# Patient Record
Sex: Female | Born: 1985 | Hispanic: Yes | Marital: Single | Smoking: Never smoker
Health system: Southern US, Community
[De-identification: ages and names within clinical notes are randomized; demographics above are authoritative.]

## PROBLEM LIST (undated history)

## (undated) DIAGNOSIS — E039 Hypothyroidism, unspecified: Secondary | ICD-10-CM

## (undated) DIAGNOSIS — R739 Hyperglycemia, unspecified: Principal | ICD-10-CM

## (undated) DIAGNOSIS — IMO0002 Reserved for concepts with insufficient information to code with codable children: Secondary | ICD-10-CM

## (undated) HISTORY — DX: Hypothyroidism, unspecified: E03.9

## (undated) HISTORY — DX: Reserved for concepts with insufficient information to code with codable children: IMO0002

## (undated) HISTORY — PX: AUGMENTATION MAMMAPLASTY: SUR837

## (undated) HISTORY — DX: Hyperglycemia, unspecified: R73.9

## (undated) HISTORY — PX: REMOVAL OF BILATERAL TISSUE EXPANDERS WITH PLACEMENT OF BILATERAL BREAST IMPLANTS: SHX6431

---

## 2011-07-25 DIAGNOSIS — IMO0002 Reserved for concepts with insufficient information to code with codable children: Secondary | ICD-10-CM

## 2011-07-25 HISTORY — DX: Reserved for concepts with insufficient information to code with codable children: IMO0002

## 2011-08-12 ENCOUNTER — Inpatient Hospital Stay (HOSPITAL_COMMUNITY)
Admission: EM | Admit: 2011-08-12 | Discharge: 2011-08-17 | DRG: 340 | Disposition: A | Discharge status: Home / Self Care | Payer: Medicaid Other | Attending: General Surgery | Admitting: General Surgery

## 2011-08-12 ENCOUNTER — Emergency Department (HOSPITAL_COMMUNITY): Payer: Medicaid Other

## 2011-08-12 ENCOUNTER — Other Ambulatory Visit (INDEPENDENT_AMBULATORY_CARE_PROVIDER_SITE_OTHER): Payer: Self-pay | Admitting: General Surgery

## 2011-08-12 DIAGNOSIS — K35209 Acute appendicitis with generalized peritonitis, without abscess, unspecified as to perforation: Principal | ICD-10-CM | POA: Diagnosis present

## 2011-08-12 DIAGNOSIS — R197 Diarrhea, unspecified: Secondary | ICD-10-CM

## 2011-08-12 DIAGNOSIS — R112 Nausea with vomiting, unspecified: Secondary | ICD-10-CM

## 2011-08-12 DIAGNOSIS — K352 Acute appendicitis with generalized peritonitis, without abscess: Principal | ICD-10-CM | POA: Diagnosis present

## 2011-08-12 DIAGNOSIS — R1031 Right lower quadrant pain: Secondary | ICD-10-CM

## 2011-08-12 DIAGNOSIS — D72829 Elevated white blood cell count, unspecified: Secondary | ICD-10-CM | POA: Diagnosis present

## 2011-08-12 DIAGNOSIS — R509 Fever, unspecified: Secondary | ICD-10-CM | POA: Diagnosis present

## 2011-08-12 HISTORY — PX: APPENDECTOMY: SHX54

## 2011-08-12 LAB — POCT I-STAT, CHEM 8
BUN: 12 mg/dL (ref 6–23)
Calcium, Ion: 1.14 mmol/L (ref 1.12–1.32)
Chloride: 97 mEq/L (ref 96–112)
Creatinine, Ser: 0.8 mg/dL (ref 0.50–1.10)
Glucose, Bld: 123 mg/dL — ABNORMAL HIGH (ref 70–99)
HCT: 41 % (ref 36.0–46.0)
Hemoglobin: 13.9 g/dL (ref 12.0–15.0)
Potassium: 2.8 mEq/L — ABNORMAL LOW (ref 3.5–5.1)
Sodium: 133 mEq/L — ABNORMAL LOW (ref 135–145)
TCO2: 23 mmol/L (ref 0–100)

## 2011-08-12 LAB — COMPREHENSIVE METABOLIC PANEL
ALT: 44 U/L — ABNORMAL HIGH (ref 0–35)
CO2: 24 mEq/L (ref 19–32)
Calcium: 9.5 mg/dL (ref 8.4–10.5)
Chloride: 94 mEq/L — ABNORMAL LOW (ref 96–112)
Creatinine, Ser: 0.72 mg/dL (ref 0.50–1.10)
GFR calc Af Amer: >90 mL/min (ref >90)
GFR calc non Af Amer: >90 mL/min (ref >90)
Glucose, Bld: 120 mg/dL — ABNORMAL HIGH (ref 70–99)
Sodium: 129 mEq/L — ABNORMAL LOW (ref 135–145)
Total Bilirubin: 1.7 mg/dL — ABNORMAL HIGH (ref 0.3–1.2)

## 2011-08-12 LAB — DIFFERENTIAL
Basophils Absolute: 0 10*3/uL (ref 0.0–0.1)
Lymphocytes Relative: 10 % — ABNORMAL LOW (ref 12–46)
Monocytes Relative: 6 % (ref 3–12)
Neutro Abs: 13.8 10*3/uL — ABNORMAL HIGH (ref 1.7–7.7)
Neutrophils Relative %: 84 % — ABNORMAL HIGH (ref 43–77)

## 2011-08-12 LAB — URINALYSIS, ROUTINE W REFLEX MICROSCOPIC
Nitrite: NEGATIVE
Protein, ur: 100 mg/dL — AB
Specific Gravity, Urine: 1.025 (ref 1.005–1.030)
Urobilinogen, UA: 0.2 mg/dL (ref 0.0–1.0)

## 2011-08-12 LAB — CBC
Platelets: 212 10*3/uL (ref 150–400)
RDW: 15.6 % — ABNORMAL HIGH (ref 11.5–15.5)
WBC: 16.4 10*3/uL — ABNORMAL HIGH (ref 4.0–10.5)

## 2011-08-12 LAB — URINE MICROSCOPIC-ADD ON

## 2011-08-12 LAB — OCCULT BLOOD, POC DEVICE: Fecal Occult Bld: NEGATIVE

## 2011-08-12 LAB — POCT PREGNANCY, URINE: Preg Test, Ur: NEGATIVE

## 2011-08-12 MED ORDER — IOHEXOL 300 MG/ML  SOLN
100.0000 mL | Freq: Once | INTRAMUSCULAR | Status: AC | PRN
  Administered 2011-08-12: 80 mL via INTRAVENOUS

## 2011-08-13 DIAGNOSIS — K3580 Unspecified acute appendicitis: Secondary | ICD-10-CM

## 2011-08-13 LAB — BASIC METABOLIC PANEL
Chloride: 103 mEq/L (ref 96–112)
Creatinine, Ser: 0.55 mg/dL (ref 0.50–1.10)
GFR calc Af Amer: >90 mL/min (ref >90)
GFR calc non Af Amer: >90 mL/min (ref >90)

## 2011-08-14 LAB — BASIC METABOLIC PANEL
CO2: 25 mEq/L (ref 19–32)
Calcium: 8.5 mg/dL (ref 8.4–10.5)
Creatinine, Ser: 0.47 mg/dL — ABNORMAL LOW (ref 0.50–1.10)
GFR calc Af Amer: >90 mL/min (ref >90)
GFR calc non Af Amer: >90 mL/min (ref >90)
Sodium: 138 mEq/L (ref 135–145)

## 2011-08-15 LAB — CBC
MCV: 81.6 fL (ref 78.0–100.0)
Platelets: 195 10*3/uL (ref 150–400)
RBC: 3.86 MIL/uL — ABNORMAL LOW (ref 3.87–5.11)
RDW: 16 % — ABNORMAL HIGH (ref 11.5–15.5)
WBC: 10.6 10*3/uL — ABNORMAL HIGH (ref 4.0–10.5)

## 2011-08-16 LAB — CBC
MCH: 26.8 pg (ref 26.0–34.0)
MCHC: 33.4 g/dL (ref 30.0–36.0)
MCV: 80 fL (ref 78.0–100.0)
Platelets: 221 10*3/uL (ref 150–400)
RDW: 15.6 % — ABNORMAL HIGH (ref 11.5–15.5)
WBC: 11.3 10*3/uL — ABNORMAL HIGH (ref 4.0–10.5)

## 2011-08-16 NOTE — Op Note (Signed)
NAMEJAKYRIA, Melanie Rodriguez NO.:  [id number]  MEDICAL RECORD NO.:  [id number]  LOCATION:  1507                         FACILITY:  [facility]  PHYSICIAN:  Adolph Pollack, M.D.DATE OF BIRTH:  01/21/1986  DATE OF PROCEDURE:  08/13/2011 DATE OF DISCHARGE:                              OPERATIVE REPORT   PREOPERATIVE DIAGNOSIS:  Acute appendicitis with perforation.  POSTOPERATIVE DIAGNOSIS:  Acute appendicitis with perforation.  PROCEDURE:  Laparoscopic appendectomy.  SURGEON:  Adolph Pollack, M.D.  ANESTHESIA:  General.  INDICATIONS:  This is a 25 year old female, who has had 2-day history of progressively increasing right lower quadrant pain and diarrhea.  CT scan demonstrates findings consistent with acute appendicitis with perforation.  There is also some mild right hydroureter related to the inflammatory process.  She now presents for the above procedure.  TECHNIQUE:  She was brought to the operative room, placed supine on the operating table, and general anesthetic was administered.  Foley catheter was inserted and clear urine was evacuated.  The abdominal wall was sterilely prepped and draped.  Marcaine was infiltrated in the subumbilical region.  A small subumbilical incision was made through the skin, subcutaneous tissue, fascia, and peritoneum, entering the peritoneal cavity under direct vision.  A pursestring suture of 0 Vicryl was placed around the fascial edges.  A Hasson trocar was introduced in the peritoneal cavity and Pneumoperitoneum was created by insufflation of CO2 gas.  Laparoscope was introduced and cloudy fluid was noted in the pelvis.  I placed a 5 mm trocar in the left lower quadrant.  I evacuated some cloudy fluid and noted the appendix to be acutely inflamed and indurated going down into the pelvis, and I was able to mobilize this free from the pelvic sidewall.  A 5 mm trocar was then placed in the right upper quadrant.  The  mesoappendix was grasped and retracted anteriorly.  I divided the mesoappendix down to the base of the cecum.  Perforation was actually in the proximal third of the appendix and there were 2 fecaliths that had escaped during the perforation and I removed these. I subsequently amputated the appendix off the cecum with a small bit of cecum using the Endo-GIA stapler and placed the appendix in the Endo pouch bag.  It was removed through the subumbilical port.  I then copiously irrigated out the pelvic area and the right lower quadrant area with 3 liters of saline solution.  The solution returned and clear.  I inspected the staple line.  There was minimal bleeding, but I did apply hemoclips to control this.  I evacuated the irrigation fluid as much as possible.  I subsequently removed the subumbilical trocar under laparoscopic vision and closed the fascial defect by tightening up and tying down the pursestring suture.  Remaining trocars removed and then pneumoperitoneum was released.  Skin incisions were closed with 4-0 Monocryl subcuticular stitches. Steri-Strips and sterile dressings were applied.  She tolerated the procedure without any apparent complications and was taken to the recovery room in satisfactory condition.     Adolph Pollack, M.D.     [name]  D:  08/13/2011  T:  08/13/2011  Job:  [id number]  cc:   Lacretia Leigh. Quintella Reichert, M.D. Fax: [phone]  Electronically Signed by Avel Peace M.D. on 08/16/2011 08:32:22 AM

## 2011-08-16 NOTE — H&P (Signed)
  NAMEYSABELLE, Melanie Rodriguez NO.:  [id number]  MEDICAL RECORD NO.:  [id number]  LOCATION:  WLED                         FACILITY:  [facility]  PHYSICIAN:  Adolph Pollack, M.D.DATE OF BIRTH:  08-10-1986  DATE OF ADMISSION:  08/12/2011 DATE OF DISCHARGE:                             HISTORY & PHYSICAL   REASON FOR ADMISSION:  Acute appendicitis with perforation.  HISTORY:  This is a 25 year old female, who 2 days prior to admission, developed right lower quadrant pain and diarrhea.  She presented to her primary care physician.  We gave her some IV fluids and IV Rocephin. She is noted have a leukocytosis at that time.  She came back to the primary care physician with continued symptoms with increasing right lower quadrant pain, and was sent to the emergency department.  She was evaluated and noted to have an acute appendicitis with signs of perforation on CT scan, and I was asked to see her.  She has had some nausea, vomiting, and fever.  PAST MEDICAL HISTORY:  No chronic illnesses.  PREVIOUS OPERATIONS:  None.  ALLERGIES:  None.  MEDICATIONS:  Prevacid and Phenergan.  SOCIAL HISTORY:  She is single.  She has a 62-month-old child.  She is in the hospital with her boyfriend.  Denies tobacco or alcohol use.  FAMILY HISTORY:  Noncontributory.  REVIEW OF SYSTEMS:  CARDIOVASCULAR:  No chest pain.  PULMONARY:  No shortness of breath.  GI:  As per HPI.  GU:  No dysuria or hematuria.  PHYSICAL EXAMINATION:  GENERAL:  Ill-appearing female, alert, pleasant, and cooperative. VITAL SIGNS:  Temperature is 100, pulse 113, blood pressure is 114/64, respiratory rate is 20. HEENT:  Normocephalic, atraumatic.  No icterus. NECK:  Supple without mass. RESPIRATORY:  Breath sounds equal and clear.  Respirations unlabored. CARDIOVASCULAR:  Increased rate, regular rhythm. ABDOMEN:  Soft with right lower quadrant tenderness and guarding.  There is no mass present. GU:  No  hernia. MUSCULOSKELETAL:  No edema.  Good range of motion. SKIN:  Increased warmth.  LABORATORY DATA:  Sodium 129, potassium 2.8.  Glucose was 120.  BUN 12, creatinine 0.72.  Total bilirubin is 1.7.  Other liver function tests were normal.  White blood cell count 16,400 and hemoglobin 12.6.  CT scan reviewed and demonstrates a dilated inflamed appendix with multiple appendicoliths present.  It appears to be a possible abscess as well.  IMPRESSION:  Acute appendicitis with likely perforation and possible abscess.  She has been given some intravenous Invanz.  PLAN:  Laparoscopic possible open appendectomy.  I discussed the procedure and the risks with her in detail.  Risks include, but not limited to bleeding, infection, wound healing problems, anesthesia, accidental damage to intraabdominal organs, such as kidney, ureter, bladder intestine.  We also talked about aftercare and potential prolonged hospital stay for infection control.  She seems to understand all this and agrees with the plan.     Adolph Pollack, M.D.     [name]  D:  08/13/2011  T:  08/13/2011  Job:  [id number]  Electronically Signed by Avel Peace M.D. on 08/16/2011 08:32:01 AM

## 2011-08-17 LAB — CBC
MCHC: 32.9 g/dL (ref 30.0–36.0)
RDW: 15.7 % — ABNORMAL HIGH (ref 11.5–15.5)

## 2011-08-20 NOTE — Discharge Summary (Signed)
  NAMEANUPAMA, PIEHL NO.:  [id number]  MEDICAL RECORD NO.:  [id number]  LOCATION:  1507                         FACILITY:  [facility]  PHYSICIAN:  Juanetta Gosling, MDDATE OF BIRTH:  [birth year]  DATE OF ADMISSION:  08/12/2011 DATE OF DISCHARGE:  08/17/2011                              DISCHARGE SUMMARY    ADMITTING DIAGNOSIS:  Acute appendicitis with perforation.  DISCHARGE DIAGNOSIS:  Acute appendicitis with perforation.  PROCEDURE:  Laparoscopic appendectomy.  ADDITIONAL DIAGNOSIS:  None.  BRIEF HISTORY:  The patient is a 25 year old female who presented to the ER and was seen on August 12, 2011, by Dr. Avel Peace.  She had a right lower quadrant pain with diarrhea.  She was given IV fluids and Rocephin.  She had leukocytosis, and CT scan was consistent with acute appendicitis with signs of perforation.  He recommended laparoscopic cholecystectomy at that time, and the patient agreed.  HOSPITAL COURSE:  The patient was admitted and taken to the OR on August 13, 2011.  She underwent laparoscopic appendectomy and tolerated the procedure well.  She has done well postoperatively.  But has continued to run fevers and somewhat elevated white count.  She had been maintained on IV Invanz and has had no problems.  Her temperature spikes are improving and her white count is still elevated.  On August 17, 2011, she is feeling fine.  She is ambulating normally without any discomfort.  Her incisions looked good.  Her abdomen is soft and nontender, and she is quite anxious to go home.  We recommended she stay for at least another 24 hours of IV antibiotics, but she would really like to go.  Dr. Dwain Sarna stated an alternate would be another full 7 days of Augmentin and the patient is agreeable to that.  We have subsequently planned to discharge her home on oxycodone/APAP 5/325, 1-2 p.o. q.4 p.r.n.  Augmentin 875/125, 1 tab q.12.  She can take plain Tylenol  p.r.n. for pain.  I am going to discontinue her Prilosec and Phenergan.  She will follow up with Dr. Abbey Chatters in 2 weeks.  She has a discharge instruction sheet with instructions to call especially if she has any increased pain, fever, trouble voiding, drainage, or abdominal pain.  DISCHARGE ACTIVITY:  Light to moderate.  No lifting over 15 pounds for 2 weeks.  CONDITION ON DISCHARGE:  Improved.     Eber Hong, P.A.   ______________________________ Juanetta Gosling, MD    WDJ/MEDQ  D:  08/17/2011  T:  08/17/2011  Job:  [id number]  cc:   Lacretia Leigh. Quintella Reichert, M.D. Fax: [phone]  Electronically Signed by Sherrie George P.A. on 08/18/2011 09:05:28 PM Electronically Signed by Emelia Loron MD on 08/20/2011 11:36:13 AM

## 2011-08-30 ENCOUNTER — Encounter (INDEPENDENT_AMBULATORY_CARE_PROVIDER_SITE_OTHER): Payer: Self-pay

## 2011-09-06 ENCOUNTER — Ambulatory Visit
Admission: RE | Admit: 2011-09-06 | Discharge: 2011-09-06 | Disposition: A | Discharge status: Home / Self Care | Payer: Medicaid Other | Source: Ambulatory Visit | Attending: General Surgery | Admitting: General Surgery

## 2011-09-06 ENCOUNTER — Encounter (INDEPENDENT_AMBULATORY_CARE_PROVIDER_SITE_OTHER): Payer: Self-pay | Admitting: General Surgery

## 2011-09-06 ENCOUNTER — Ambulatory Visit (INDEPENDENT_AMBULATORY_CARE_PROVIDER_SITE_OTHER): Payer: Self-pay | Admitting: General Surgery

## 2011-09-06 VITALS — BP 106/64 | HR 76 | Temp 99.2°F | Resp 14 | Ht 63.0 in | Wt 139.4 lb

## 2011-09-06 DIAGNOSIS — Z9889 Other specified postprocedural states: Secondary | ICD-10-CM

## 2011-09-06 DIAGNOSIS — G8918 Other acute postprocedural pain: Secondary | ICD-10-CM

## 2011-09-06 DIAGNOSIS — K3580 Unspecified acute appendicitis: Secondary | ICD-10-CM

## 2011-09-06 MED ORDER — IOHEXOL 300 MG/ML  SOLN
100.0000 mL | Freq: Once | INTRAMUSCULAR | Status: AC | PRN
  Administered 2011-09-06: 100 mL via INTRAVENOUS

## 2011-09-06 NOTE — Progress Notes (Signed)
Melanie Rodriguez 18-Nov-1985 [id number] 09/06/2011   History of Present Illness: Melanie Rodriguez is a  25 y.o. female who presents today status post lap appy dr. Lenetta Quaker.  Pathology reveals acute suppurative appendicitis.  The patient is tolerating a regular diet, having normal bowel movements.  She says about 1.5 weeks ago she felt she was running fevers.  Never checked.  C/o pus-like urine and maybe some blood.  Unable to tell if this is from her menstrual cycle trying to start back after having her baby.  Her pain is constant, in the RLQ and radiating towards her back.  She does some pain in her abdomen with voiding, no dysuria.  Physical Exam: Abd: soft, tender in RLQ, hypoactive bowel sounds, nondistended.  No peritoneal signs or guading.  All incisions are well healed.  Impression: 1.  Acute appendicitis, s/p lap appy  Plan: She will be sent for CBC, BMET, UA, and a CT scan of abd/pelvis to r/o post-op abscess vs UTI.  I'm concerned with her description of her urine, but her other symptoms are more c/w post-op abscess.  We will follow up with her tomorrow after her labs and CT are complete for further instructions.

## 2011-09-06 NOTE — Patient Instructions (Signed)
We will call you with further instructions after your labs and imaging are complete.

## 2011-09-07 LAB — CBC WITH DIFFERENTIAL/PLATELET
Eosinophils Absolute: 0.1 10*3/uL (ref 0.0–0.7)
Lymphocytes Relative: 23 % (ref 12–46)
Lymphs Abs: 1.8 10*3/uL (ref 0.7–4.0)
MCH: 27 pg (ref 26.0–34.0)
Neutrophils Relative %: 66 % (ref 43–77)
Platelets: 328 10*3/uL (ref 150–400)
RBC: 3.82 MIL/uL — ABNORMAL LOW (ref 3.87–5.11)
WBC: 7.7 10*3/uL (ref 4.0–10.5)

## 2011-09-07 LAB — BASIC METABOLIC PANEL
CO2: 26 mEq/L (ref 19–32)
Calcium: 9.1 mg/dL (ref 8.4–10.5)
Sodium: 142 mEq/L (ref 135–145)

## 2011-09-08 ENCOUNTER — Other Ambulatory Visit (INDEPENDENT_AMBULATORY_CARE_PROVIDER_SITE_OTHER): Payer: Self-pay

## 2011-09-08 DIAGNOSIS — T8140XA Infection following a procedure, unspecified, initial encounter: Secondary | ICD-10-CM

## 2011-09-08 LAB — URINALYSIS
Ketones, ur: NEGATIVE mg/dL
Leukocytes, UA: NEGATIVE
Nitrite: NEGATIVE
Specific Gravity, Urine: 1.017 (ref 1.005–1.030)
pH: 6 (ref 5.0–8.0)

## 2011-09-08 MED ORDER — CIPROFLOXACIN HCL 500 MG PO TABS: 500.0000 mg | ORAL_TABLET | Freq: Two times a day (BID) | ORAL | Status: AC

## 2011-09-20 ENCOUNTER — Emergency Department (HOSPITAL_COMMUNITY)
Admission: EM | Admit: 2011-09-20 | Discharge: 2011-09-20 | Discharge status: Against Medical Advice | Payer: Self-pay | Attending: Emergency Medicine | Admitting: Emergency Medicine

## 2011-09-20 ENCOUNTER — Encounter (HOSPITAL_COMMUNITY): Payer: Self-pay | Admitting: Emergency Medicine

## 2011-09-20 DIAGNOSIS — R109 Unspecified abdominal pain: Secondary | ICD-10-CM | POA: Insufficient documentation

## 2011-09-20 NOTE — ED Notes (Signed)
Pt had appendectomy on 10/20, later had infection. Pt states finished cipro yesterday and is still having bad pain on R side.

## 2011-09-20 NOTE — ED Notes (Signed)
Pt has decided to leave, AMA papers signed

## 2011-09-21 ENCOUNTER — Emergency Department (HOSPITAL_COMMUNITY): Payer: Self-pay

## 2011-09-21 ENCOUNTER — Emergency Department (HOSPITAL_COMMUNITY)
Admission: EM | Admit: 2011-09-21 | Discharge: 2011-09-22 | Disposition: A | Discharge status: Home / Self Care | Payer: Self-pay | Attending: Emergency Medicine | Admitting: Emergency Medicine

## 2011-09-21 ENCOUNTER — Encounter (HOSPITAL_COMMUNITY): Payer: Self-pay | Admitting: *Deleted

## 2011-09-21 DIAGNOSIS — R1031 Right lower quadrant pain: Secondary | ICD-10-CM | POA: Insufficient documentation

## 2011-09-21 DIAGNOSIS — R112 Nausea with vomiting, unspecified: Secondary | ICD-10-CM | POA: Insufficient documentation

## 2011-09-21 LAB — COMPREHENSIVE METABOLIC PANEL
ALT: 20 U/L (ref 0–35)
AST: 17 U/L (ref 0–37)
Albumin: 3.7 g/dL (ref 3.5–5.2)
CO2: 24 mEq/L (ref 19–32)
Calcium: 9.6 mg/dL (ref 8.4–10.5)
GFR calc non Af Amer: >90 mL/min (ref >90)
Sodium: 136 mEq/L (ref 135–145)

## 2011-09-21 LAB — DIFFERENTIAL
Basophils Absolute: 0 10*3/uL (ref 0.0–0.1)
Eosinophils Relative: 0 % (ref 0–5)
Lymphocytes Relative: 12 % (ref 12–46)
Neutro Abs: 12.1 10*3/uL — ABNORMAL HIGH (ref 1.7–7.7)

## 2011-09-21 LAB — WET PREP, GENITAL
Clue Cells Wet Prep HPF POC: NONE SEEN
Trich, Wet Prep: NONE SEEN

## 2011-09-21 LAB — CBC
Platelets: 350 10*3/uL (ref 150–400)
RDW: 15.3 % (ref 11.5–15.5)
WBC: 14.9 10*3/uL — ABNORMAL HIGH (ref 4.0–10.5)

## 2011-09-21 LAB — URINE MICROSCOPIC-ADD ON

## 2011-09-21 LAB — POCT PREGNANCY, URINE: Preg Test, Ur: NEGATIVE

## 2011-09-21 LAB — URINALYSIS, ROUTINE W REFLEX MICROSCOPIC
Nitrite: NEGATIVE
Specific Gravity, Urine: 1.016 (ref 1.005–1.030)
Urobilinogen, UA: 0.2 mg/dL (ref 0.0–1.0)

## 2011-09-21 MED ORDER — AMOXICILLIN-POT CLAVULANATE 875-125 MG PO TABS: 1.0000 | ORAL_TABLET | Freq: Two times a day (BID) | ORAL | Status: DC

## 2011-09-21 MED ORDER — HYDROMORPHONE HCL PF 1 MG/ML IJ SOLN: 1.0000 mg | Freq: Once | INTRAMUSCULAR | Status: DC

## 2011-09-21 MED ORDER — PIPERACILLIN-TAZOBACTAM 3.375 G IVPB
3.3750 g | Freq: Once | INTRAVENOUS | Status: AC
  Administered 2011-09-21: 3.375 g via INTRAVENOUS
  Filled 2011-09-21: qty 50

## 2011-09-21 MED ORDER — SODIUM CHLORIDE 0.9 % IV BOLUS (SEPSIS)
1000.0000 mL | Freq: Once | INTRAVENOUS | Status: AC
  Administered 2011-09-21: 1000 mL via INTRAVENOUS

## 2011-09-21 MED ORDER — MORPHINE SULFATE 4 MG/ML IJ SOLN: 4.0000 mg | Freq: Once | INTRAMUSCULAR | Status: DC

## 2011-09-21 MED ORDER — OXYCODONE-ACETAMINOPHEN 5-325 MG PO TABS
2.0000 | ORAL_TABLET | Freq: Once | ORAL | Status: AC
  Administered 2011-09-21: 2 via ORAL
  Filled 2011-09-21: qty 2

## 2011-09-21 MED ORDER — OXYCODONE-ACETAMINOPHEN 5-325 MG PO TABS: 2.0000 | ORAL_TABLET | Freq: Four times a day (QID) | ORAL | Status: DC | PRN

## 2011-09-21 MED ORDER — HYDROMORPHONE HCL PF 2 MG/ML IJ SOLN
INTRAMUSCULAR | Status: AC
  Administered 2011-09-21: 1 mg via INTRAVENOUS
  Filled 2011-09-21: qty 1

## 2011-09-21 MED ORDER — IOHEXOL 300 MG/ML  SOLN
80.0000 mL | Freq: Once | INTRAMUSCULAR | Status: AC | PRN
  Administered 2011-09-21: 80 mL via INTRAVENOUS

## 2011-09-21 MED ORDER — ONDANSETRON HCL 4 MG/2ML IJ SOLN
4.0000 mg | Freq: Once | INTRAMUSCULAR | Status: AC
  Administered 2011-09-21: 4 mg via INTRAVENOUS
  Filled 2011-09-21: qty 2

## 2011-09-21 MED ORDER — HYDROMORPHONE HCL PF 1 MG/ML IJ SOLN
1.0000 mg | Freq: Once | INTRAMUSCULAR | Status: AC
  Administered 2011-09-21: 1 mg via INTRAVENOUS
  Filled 2011-09-21: qty 1

## 2011-09-21 NOTE — ED Notes (Signed)
Pt drinking PO contrast

## 2011-09-21 NOTE — ED Provider Notes (Cosign Needed Addendum)
History     CSN: [id number] Arrival date & time: 09/21/2011 10:50 AM   First MD Initiated Contact with Patient 09/21/11 1158      Chief Complaint  Patient presents with  . Abdominal Pain   pleasant 25 year old female with history of recent appendicitis in October. She also was seen in earlier this month for abdominal pain and had a CAT scan. She reports worsening right lower quadrant pain since yesterday, much worse this morning. Patient had been taking Cipro for possible kidney infection. She denies any vaginal bleeding or discharge. She's had no fevers. She does report some intermittent diarrhea and some intermittent vomiting, denies any hematemesis. She denies any other previous surgeries. No recent sick contacts or travel, but she is aware of  (Consider location/radiation/quality/duration/timing/severity/associated sxs/prior treatment) HPI  History reviewed. No pertinent past medical history.  Past Surgical History  Procedure Date  . Appendectomy     No family history on file.  History  Substance Use Topics  . Smoking status: Never Smoker   . Smokeless tobacco: Not on file  . Alcohol Use: No    OB History    Grav Para Term Preterm Abortions TAB SAB Ect Mult Living                  Review of Systems  All other systems reviewed and are negative.    Allergies  Review of patient's allergies indicates no known allergies.  Home Medications   Current Outpatient Rx  Name Route Sig Dispense Refill  . IBUPROFEN 200 MG PO TABS Oral Take 200 mg by mouth every 6 (six) hours as needed.        BP 116/61  Pulse 74  Temp(Src) 99.9 F (37.7 C) (Oral)  Resp 20  Wt 137 lb (62.143 kg)  SpO2 100%  LMP 09/05/2010  Physical Exam  Constitutional: She is oriented to person, place, and time. She appears well-developed and well-nourished.  HENT:  Head: Normocephalic and atraumatic.  Eyes: Conjunctivae and EOM are normal. Pupils are equal, round, and reactive to light.    Neck: Neck supple.  Cardiovascular: Normal rate and regular rhythm.  Exam reveals no gallop and no friction rub.   No murmur heard. Pulmonary/Chest: Breath sounds normal. She has no wheezes. She has no rales. She exhibits no tenderness.  Abdominal: Soft. Bowel sounds are normal. She exhibits no distension. There is tenderness. There is no rebound and no guarding.       Diffuse right lower quadrant tenderness, no rebound, rigidity or guarding.  Musculoskeletal: Normal range of motion.  Neurological: She is alert and oriented to person, place, and time. No cranial nerve deficit. Coordination normal.  Skin: Skin is warm and dry. No rash noted.  Psychiatric: She has a normal mood and affect.    ED Course  Procedures (including critical care time)  Labs Reviewed  URINALYSIS, ROUTINE W REFLEX MICROSCOPIC - Abnormal; Notable for the following:    Leukocytes, UA SMALL (*)    All other components within normal limits  URINE MICROSCOPIC-ADD ON - Abnormal; Notable for the following:    Squamous Epithelial / LPF FEW (*)    Bacteria, UA FEW (*)    All other components within normal limits  POCT PREGNANCY, URINE  POCT PREGNANCY, URINE   No results found.   No diagnosis found.    MDM  Pt is seen and examined;  Initial history and physical completed.  Will follow.   Results for orders placed during the hospital  encounter of 09/21/11  URINALYSIS, ROUTINE W REFLEX MICROSCOPIC      Component Value Range   Color, Urine YELLOW  YELLOW    Appearance CLEAR  CLEAR    Specific Gravity, Urine 1.016  1.005 - 1.030    pH 6.0  5.0 - 8.0    Glucose, UA NEGATIVE  NEGATIVE (mg/dL)   Hgb urine dipstick NEGATIVE  NEGATIVE    Bilirubin Urine NEGATIVE  NEGATIVE    Ketones, ur NEGATIVE  NEGATIVE (mg/dL)   Protein, ur NEGATIVE  NEGATIVE (mg/dL)   Urobilinogen, UA 0.2  0.0 - 1.0 (mg/dL)   Nitrite NEGATIVE  NEGATIVE    Leukocytes, UA SMALL (*) NEGATIVE   POCT PREGNANCY, URINE      Component Value Range    Preg Test, Ur NEGATIVE    URINE MICROSCOPIC-ADD ON      Component Value Range   Squamous Epithelial / LPF FEW (*) RARE    WBC, UA 3-6  <3 (WBC/hpf)   Bacteria, UA FEW (*) RARE    Urine-Other MUCOUS PRESENT     Ct Abdomen Pelvis W Contrast  09/06/2011  *RADIOLOGY REPORT*  Clinical Data: 25 year old female with perforated appendicitis - status post laparoscopic appendectomy 3 weeks ago.  Continued right abdominal and pelvic pain.  CT ABDOMEN AND PELVIS WITH CONTRAST  Technique:  Multidetector CT imaging of the abdomen and pelvis was performed following the standard protocol during bolus administration of intravenous contrast.  Contrast: OMNIPAQUE IOHEXOL 300 MG/ML IV SOLN  Comparison: 08/12/2011  Findings: Faint areas of decreased cortical attenuation within both kidneys are noted and pyelonephritis is not excluded. There is no evidence of hydronephrosis, urinary calculi or renal abscess/mass.  The liver, spleen, pancreas, adrenal glands and gallbladder are unremarkable.  Mild inflammatory changes within the right lower abdomen and pelvis are noted and likely postsurgical or residual changes from perforated appendicitis. There is no evidence of postoperative abscess, pneumoperitoneum or bowel obstruction. The bladder is within normal limits. A 2.2 cm right ovarian cyst / follicle is present. A trace amount of free fluid within the pelvis may be postsurgical or physiologic.  No acute or suspicious bony abnormalities are identified.  IMPRESSION: Very subtle areas of decreased cortical attenuation within both kidneys - pyelonephritis is not excluded.  Mild inflammatory changes in the right lower abdomen and pelvis - likely postsurgical and residual changes from prior perforated appendicitis.  No evidence of abscess.  Original Report Authenticated By: Rosendo Gros, M.D.            Posey Jasmin A. Patrica Duel, MD 09/21/11 1213   Results for orders placed during the hospital encounter of 09/21/11   URINALYSIS, ROUTINE W REFLEX MICROSCOPIC      Component Value Range   Color, Urine YELLOW  YELLOW    Appearance CLEAR  CLEAR    Specific Gravity, Urine 1.016  1.005 - 1.030    pH 6.0  5.0 - 8.0    Glucose, UA NEGATIVE  NEGATIVE (mg/dL)   Hgb urine dipstick NEGATIVE  NEGATIVE    Bilirubin Urine NEGATIVE  NEGATIVE    Ketones, ur NEGATIVE  NEGATIVE (mg/dL)   Protein, ur NEGATIVE  NEGATIVE (mg/dL)   Urobilinogen, UA 0.2  0.0 - 1.0 (mg/dL)   Nitrite NEGATIVE  NEGATIVE    Leukocytes, UA SMALL (*) NEGATIVE   POCT PREGNANCY, URINE      Component Value Range   Preg Test, Ur NEGATIVE    URINE MICROSCOPIC-ADD ON      Component  Value Range   Squamous Epithelial / LPF FEW (*) RARE    WBC, UA 3-6  <3 (WBC/hpf)   Bacteria, UA FEW (*) RARE    Urine-Other MUCOUS PRESENT     Ct Abdomen Pelvis W Contrast  09/06/2011  *RADIOLOGY REPORT*  Clinical Data: 25 year old female with perforated appendicitis - status post laparoscopic appendectomy 3 weeks ago.  Continued right abdominal and pelvic pain.  CT ABDOMEN AND PELVIS WITH CONTRAST  Technique:  Multidetector CT imaging of the abdomen and pelvis was performed following the standard protocol during bolus administration of intravenous contrast.  Contrast: OMNIPAQUE IOHEXOL 300 MG/ML IV SOLN  Comparison: 08/12/2011  Findings: Faint areas of decreased cortical attenuation within both kidneys are noted and pyelonephritis is not excluded. There is no evidence of hydronephrosis, urinary calculi or renal abscess/mass.  The liver, spleen, pancreas, adrenal glands and gallbladder are unremarkable.  Mild inflammatory changes within the right lower abdomen and pelvis are noted and likely postsurgical or residual changes from perforated appendicitis. There is no evidence of postoperative abscess, pneumoperitoneum or bowel obstruction. The bladder is within normal limits. A 2.2 cm right ovarian cyst / follicle is present. A trace amount of free fluid within the pelvis  may be postsurgical or physiologic.  No acute or suspicious bony abnormalities are identified.  IMPRESSION: Very subtle areas of decreased cortical attenuation within both kidneys - pyelonephritis is not excluded.  Mild inflammatory changes in the right lower abdomen and pelvis - likely postsurgical and residual changes from prior perforated appendicitis.  No evidence of abscess.  Original Report Authenticated By: Rosendo Gros, M.D.    Results for orders placed during the hospital encounter of 09/21/11  URINALYSIS, ROUTINE W REFLEX MICROSCOPIC      Component Value Range   Color, Urine YELLOW  YELLOW    APPearance CLEAR  CLEAR    Specific Gravity, Urine 1.016  1.005 - 1.030    pH 6.0  5.0 - 8.0    Glucose, UA NEGATIVE  NEGATIVE (mg/dL)   Hgb urine dipstick NEGATIVE  NEGATIVE    Bilirubin Urine NEGATIVE  NEGATIVE    Ketones, ur NEGATIVE  NEGATIVE (mg/dL)   Protein, ur NEGATIVE  NEGATIVE (mg/dL)   Urobilinogen, UA 0.2  0.0 - 1.0 (mg/dL)   Nitrite NEGATIVE  NEGATIVE    Leukocytes, UA SMALL (*) NEGATIVE   POCT PREGNANCY, URINE      Component Value Range   Preg Test, Ur NEGATIVE    URINE MICROSCOPIC-ADD ON      Component Value Range   Squamous Epithelial / LPF FEW (*) RARE    WBC, UA 3-6  <3 (WBC/hpf)   Bacteria, UA FEW (*) RARE    Urine-Other MUCOUS PRESENT    CBC      Component Value Range   WBC 14.9 (*) 4.0 - 10.5 (K/uL)   RBC 4.29  3.87 - 5.11 (MIL/uL)   Hemoglobin 11.6 (*) 12.0 - 15.0 (g/dL)   HCT 04.5 (*) 40.9 - 46.0 (%)   MCV 82.1  78.0 - 100.0 (fL)   MCH 27.0  26.0 - 34.0 (pg)   MCHC 33.0  30.0 - 36.0 (g/dL)   RDW 81.1  91.4 - 78.2 (%)   Platelets 350  150 - 400 (K/uL)  DIFFERENTIAL      Component Value Range   Neutrophils Relative 81 (*) 43 - 77 (%)   Neutro Abs 12.1 (*) 1.7 - 7.7 (K/uL)   Lymphocytes Relative 12  12 - 46 (%)  Lymphs Abs 1.8  0.7 - 4.0 (K/uL)   Monocytes Relative 6  3 - 12 (%)   Monocytes Absolute 0.9  0.1 - 1.0 (K/uL)   Eosinophils Relative 0  0 -  5 (%)   Eosinophils Absolute 0.1  0.0 - 0.7 (K/uL)   Basophils Relative 0  0 - 1 (%)   Basophils Absolute 0.0  0.0 - 0.1 (K/uL)  COMPREHENSIVE METABOLIC PANEL      Component Value Range   Sodium 136  135 - 145 (mEq/L)   Potassium 3.7  3.5 - 5.1 (mEq/L)   Chloride 101  96 - 112 (mEq/L)   CO2 24  19 - 32 (mEq/L)   Glucose, Bld 82  70 - 99 (mg/dL)   BUN 6  6 - 23 (mg/dL)   Creatinine, Ser 1.61 (*) 0.50 - 1.10 (mg/dL)   Calcium 9.6  8.4 - 09.6 (mg/dL)   Total Protein 8.6 (*) 6.0 - 8.3 (g/dL)   Albumin 3.7  3.5 - 5.2 (g/dL)   AST 17  0 - 37 (U/L)   ALT 20  0 - 35 (U/L)   Alkaline Phosphatase 104  39 - 117 (U/L)   Total Bilirubin 0.6  0.3 - 1.2 (mg/dL)   GFR calc non Af Amer >90  >90 (mL/min)   GFR calc Af Amer >90  >90 (mL/min)  LIPASE, BLOOD      Component Value Range   Lipase 24  11 - 59 (U/L)   Ct Abdomen Pelvis W Contrast  09/06/2011  *RADIOLOGY REPORT*  Clinical Data: 25 year old female with perforated appendicitis - status post laparoscopic appendectomy 3 weeks ago.  Continued right abdominal and pelvic pain.  CT ABDOMEN AND PELVIS WITH CONTRAST  Technique:  Multidetector CT imaging of the abdomen and pelvis was performed following the standard protocol during bolus administration of intravenous contrast.  Contrast: OMNIPAQUE IOHEXOL 300 MG/ML IV SOLN  Comparison: 08/12/2011  Findings: Faint areas of decreased cortical attenuation within both kidneys are noted and pyelonephritis is not excluded. There is no evidence of hydronephrosis, urinary calculi or renal abscess/mass.  The liver, spleen, pancreas, adrenal glands and gallbladder are unremarkable.  Mild inflammatory changes within the right lower abdomen and pelvis are noted and likely postsurgical or residual changes from perforated appendicitis. There is no evidence of postoperative abscess, pneumoperitoneum or bowel obstruction. The bladder is within normal limits. A 2.2 cm right ovarian cyst / follicle is present. A trace  amount of free fluid within the pelvis may be postsurgical or physiologic.  No acute or suspicious bony abnormalities are identified.  IMPRESSION: Very subtle areas of decreased cortical attenuation within both kidneys - pyelonephritis is not excluded.  Mild inflammatory changes in the right lower abdomen and pelvis - likely postsurgical and residual changes from prior perforated appendicitis.  No evidence of abscess.  Original Report Authenticated By: Rosendo Gros, M.D.    3:38 PM Abdominal pain, white count 14,000, noted. Will be signed out to the oncoming physician pending CT scan, reassessment, and appropriate disposition, stable condition  Jannice Beitzel A. Patrica Duel, MD 09/21/11 1539

## 2011-09-21 NOTE — ED Notes (Signed)
Pt speaks limited English, interpreter is friend, states "she started having belly pain yesterday morning all on the right side, vomited x 2"; pt denies diarrhea.

## 2011-09-21 NOTE — ED Notes (Signed)
Patient given discharge instructions, information, prescriptions, and diet order. Patient states that they adequately understand discharge information given and to return to ED if symptoms return or worsen.    Pt advised to finish all of her antibiotics. 2 Rx's given.

## 2011-09-21 NOTE — ED Notes (Signed)
MD at bedside. 

## 2011-09-21 NOTE — ED Notes (Signed)
Pt ambulatory to the BR with steady gait

## 2011-09-21 NOTE — Consult Note (Signed)
Chief Complaint:  Abdominal pain; 4 weeks out from perforated appendicitis.  History of Present Illness:  Melanie Rodriguez is an 25 y.o. female who presented  to the ED and a complete workup by Dr. Alto Denver including the CT scan listed below, a pelvic ultrasound, and a pelvic exam showed an inflammatory mass in the lower abdomen.  To me this looks too high to be tuboovarian and it appears as a phlegmon although it is probably an evolving abscess.  Presently she has some RLQ abdominal pain.    History reviewed. No pertinent past medical history.  Past Surgical History  Procedure Date  . Appendectomy     Medications Prior to Admission  Medication Dose Route Frequency Provider Last Rate Last Dose  . HYDROmorphone (DILAUDID) 2 MG/ML injection        1 mg at 09/21/11 1425  . HYDROmorphone (DILAUDID) injection 1 mg  1 mg Intravenous Once Cyndra Numbers, MD   1 mg at 09/21/11 2030  . iohexol (OMNIPAQUE) 300 MG/ML injection 80 mL  80 mL Intravenous Once PRN Medication Radiologist   80 mL at 09/21/11 1616  . ondansetron (ZOFRAN) injection 4 mg  4 mg Intravenous Once Peter A. Tucich, MD   4 mg at 09/21/11 1425  . piperacillin-tazobactam (ZOSYN) IVPB 3.375 g  3.375 g Intravenous Once Cyndra Numbers, MD   3.375 g at 09/21/11 2131  . sodium chloride 0.9 % bolus 1,000 mL  1,000 mL Intravenous Once Peter A. Tucich, MD   1,000 mL at 09/21/11 1429  . DISCONTD: HYDROmorphone (DILAUDID) injection 1 mg  1 mg Intravenous Once Peter A. Patrica Duel, MD      . DISCONTD: morphine 4 MG/ML injection 4 mg  4 mg Intravenous Once Cyndra Numbers, MD       Medications Prior to Admission  Medication Sig Dispense Refill  . ibuprofen (ADVIL,MOTRIN) 200 MG tablet Take 200 mg by mouth every 6 (six) hours as needed.         No Known Allergies No family history on file. Social History:   reports that she has never smoked. She does not have any smokeless tobacco history on file. She reports that she does not drink alcohol or use illicit  drugs.   REVIEW OF SYSTEMS - PERTINENT POSITIVES ONLY: Not contributory  Physical Exam:   Blood pressure 123/73, pulse 100, temperature 100.3 F (37.9 C), temperature source Oral, resp. rate 20, weight 137 lb (62.143 kg), last menstrual period 09/05/2010, SpO2 97.00%. There is no height on file to calculate BMI.  Gen:  No acute distress.   Neurological: Alert and oriented to person, place, and time. Speaks some English and understands.  Head: Normocephalic and atraumatic.  Eyes: Conjunctivae are normal. Pupils are equal, round, and reactive to light. No scleral icterus.  Neck: Normal range of motion. Neck supple. No tracheal deviation or thyromegaly present.  Cardiovascular:  SR without murmurs or gallops Respiratory: Effort normal.  No respiratory distress. No chest wall tenderness. Breath sounds normal.  No wheezes, rales or rhonchi.  GI: Flat abdomen.  Incisions healed from laparoscopy.  Tender to palpation to the right and below umbilicus GU:  Musculoskeletal: Normal range of motion. Extremities are nontender.  Lymphadenopathy: No cervical, preauricular, postauricular or axillary adenopathy is present Skin: Skin is warm and dry. No rash noted. No diaphoresis. No erythema. No pallor. No clubbing, cyanosis, or edema.  Pscyh: Normal mood and affect. Behavior is normal. Judgment and thought content normal.   LABORATORY RESULTS: Results for orders  placed during the hospital encounter of 09/21/11 (from the past 48 hour(s))  POCT PREGNANCY, URINE     Status: Normal   Collection Time   09/21/11 11:33 AM      Component Value Range Comment   Preg Test, Ur NEGATIVE     URINALYSIS, ROUTINE W REFLEX MICROSCOPIC     Status: Abnormal   Collection Time   09/21/11 12:30 PM      Component Value Range Comment   Color, Urine YELLOW  YELLOW     APPearance CLEAR  CLEAR     Specific Gravity, Urine 1.016  1.005 - 1.030     pH 6.0  5.0 - 8.0     Glucose, UA NEGATIVE  NEGATIVE (mg/dL)    Hgb urine  dipstick NEGATIVE  NEGATIVE     Bilirubin Urine NEGATIVE  NEGATIVE     Ketones, ur NEGATIVE  NEGATIVE (mg/dL)    Protein, ur NEGATIVE  NEGATIVE (mg/dL)    Urobilinogen, UA 0.2  0.0 - 1.0 (mg/dL)    Nitrite NEGATIVE  NEGATIVE     Leukocytes, UA SMALL (*) NEGATIVE    URINE MICROSCOPIC-ADD ON     Status: Abnormal   Collection Time   09/21/11 12:30 PM      Component Value Range Comment   Squamous Epithelial / LPF FEW (*) RARE     WBC, UA 3-6  <3 (WBC/hpf)    Bacteria, UA FEW (*) RARE     Urine-Other MUCOUS PRESENT     CBC     Status: Abnormal   Collection Time   09/21/11  2:10 PM      Component Value Range Comment   WBC 14.9 (*) 4.0 - 10.5 (K/uL)    RBC 4.29  3.87 - 5.11 (MIL/uL)    Hemoglobin 11.6 (*) 12.0 - 15.0 (g/dL)    HCT 16.1 (*) 09.6 - 46.0 (%)    MCV 82.1  78.0 - 100.0 (fL)    MCH 27.0  26.0 - 34.0 (pg)    MCHC 33.0  30.0 - 36.0 (g/dL)    RDW 04.5  40.9 - 81.1 (%)    Platelets 350  150 - 400 (K/uL)   DIFFERENTIAL     Status: Abnormal   Collection Time   09/21/11  2:10 PM      Component Value Range Comment   Neutrophils Relative 81 (*) 43 - 77 (%)    Neutro Abs 12.1 (*) 1.7 - 7.7 (K/uL)    Lymphocytes Relative 12  12 - 46 (%)    Lymphs Abs 1.8  0.7 - 4.0 (K/uL)    Monocytes Relative 6  3 - 12 (%)    Monocytes Absolute 0.9  0.1 - 1.0 (K/uL)    Eosinophils Relative 0  0 - 5 (%)    Eosinophils Absolute 0.1  0.0 - 0.7 (K/uL)    Basophils Relative 0  0 - 1 (%)    Basophils Absolute 0.0  0.0 - 0.1 (K/uL)   COMPREHENSIVE METABOLIC PANEL     Status: Abnormal   Collection Time   09/21/11  2:10 PM      Component Value Range Comment   Sodium 136  135 - 145 (mEq/L)    Potassium 3.7  3.5 - 5.1 (mEq/L)    Chloride 101  96 - 112 (mEq/L)    CO2 24  19 - 32 (mEq/L)    Glucose, Bld 82  70 - 99 (mg/dL)    BUN 6  6 - 23 (  mg/dL)    Creatinine, Ser 4.54 (*) 0.50 - 1.10 (mg/dL)    Calcium 9.6  8.4 - 10.5 (mg/dL)    Total Protein 8.6 (*) 6.0 - 8.3 (g/dL)    Albumin 3.7  3.5 - 5.2  (g/dL)    AST 17  0 - 37 (U/L)    ALT 20  0 - 35 (U/L)    Alkaline Phosphatase 104  39 - 117 (U/L)    Total Bilirubin 0.6  0.3 - 1.2 (mg/dL)    GFR calc non Af Amer >90  >90 (mL/min)    GFR calc Af Amer >90  >90 (mL/min)   LIPASE, BLOOD     Status: Normal   Collection Time   09/21/11  2:10 PM      Component Value Range Comment   Lipase 24  11 - 59 (U/L)   WET PREP, GENITAL     Status: Abnormal   Collection Time   09/21/11  5:36 PM      Component Value Range Comment   Yeast, Wet Prep NONE SEEN  NONE SEEN     Trich, Wet Prep NONE SEEN  NONE SEEN     Clue Cells, Wet Prep NONE SEEN  NONE SEEN     WBC, Wet Prep HPF POC MODERATE (*) NONE SEEN      RADIOLOGY RESULTS: US Transvaginal Non-ob  09/21/2011  *RADIOLOGY REPORT*  Clinical Data: Right lower quadrant pelvic pain  TRANSABDOMINAL AND TRANSVAGINAL ULTRASOUND OF PELVIS Technique:  Both transabdominal and transvaginal ultrasound examinations of the pelvis were performed. Transabdominal technique was performed for global imaging of the pelvis including uterus, ovaries, adnexal regions, and pelvic cul-de-sac.  Comparison: CT same date   It was necessary to proceed with endovaginal exam following the transabdominal exam to visualize the right adnexa and endometrium.  Findings:  Uterus: Retroverted, retroflexed.  7.5 x 5.4 x 5.0 cm.  No focal abnormality.  Endometrium: 9 mm.  Uniformly thin and echogenic.  Right ovary:  6.4 x 6.2 x 5.0 cm.  Cyst with reticular lace-like echoes measures 3.3 x 3.0 x 2.4 cm.  There may be a collapsing cyst adjacent to this within the right ovary, which could have accounted for the previous appearance on separate examination.  No hydrosalpinx is visualized.  Left ovary: 2.9 x 1.8 x 1.4 cm.  Normal.  Other findings: No free fluid  IMPRESSION: Probable right ovarian hemorrhagic cyst.  This accounts for the finding on the prior examination and no hydrosalpinx is identified. This may account for the patient's pain.  Short-interval follow up ultrasound in 6-12 weeks is recommended, preferably during the week following the patient's normal menses.  These results were called by telephone on 09/21/2011  at  7:45 p.m. to  Dr. Alto Denver, who verbally acknowledged these results.  Original Report Authenticated By: Harrel Lemon, M.D.   US Pelvis Complete  09/21/2011  *RADIOLOGY REPORT*  Clinical Data: Right lower quadrant pelvic pain  TRANSABDOMINAL AND TRANSVAGINAL ULTRASOUND OF PELVIS Technique:  Both transabdominal and transvaginal ultrasound examinations of the pelvis were performed. Transabdominal technique was performed for global imaging of the pelvis including uterus, ovaries, adnexal regions, and pelvic cul-de-sac.  Comparison: CT same date   It was necessary to proceed with endovaginal exam following the transabdominal exam to visualize the right adnexa and endometrium.  Findings:  Uterus: Retroverted, retroflexed.  7.5 x 5.4 x 5.0 cm.  No focal abnormality.  Endometrium: 9 mm.  Uniformly thin and echogenic.  Right ovary:  6.4 x 6.2 x 5.0 cm.  Cyst with reticular lace-like echoes measures 3.3 x 3.0 x 2.4 cm.  There may be a collapsing cyst adjacent to this within the right ovary, which could have accounted for the previous appearance on separate examination.  No hydrosalpinx is visualized.  Left ovary: 2.9 x 1.8 x 1.4 cm.  Normal.  Other findings: No free fluid  IMPRESSION: Probable right ovarian hemorrhagic cyst.  This accounts for the finding on the prior examination and no hydrosalpinx is identified. This may account for the patient's pain. Short-interval follow up ultrasound in 6-12 weeks is recommended, preferably during the week following the patient's normal menses.  These results were called by telephone on 09/21/2011  at  7:45 p.m. to  Dr. Alto Denver, who verbally acknowledged these results.  Original Report Authenticated By: Harrel Lemon, M.D.   Ct Abdomen Pelvis W Contrast  09/21/2011  *RADIOLOGY REPORT*   Clinical Data: Right upper quadrant and right lower quadrant abdominal pain.  CT ABDOMEN AND PELVIS WITH CONTRAST  Technique:  Multidetector CT imaging of the abdomen and pelvis was performed following the standard protocol during bolus administration of intravenous contrast.  Contrast: 80mL OMNIPAQUE IOHEXOL 300 MG/ML IV SOLN  Comparison: 09/06/2011  Findings: Lung bases are clear.  Abdominal viscera are normal.  No lymphadenopathy or ascites.  No free air.  Evidence of prior appendectomy noted.  Right ovarian cyst and adjacent tubular right adnexal structure noted.  Together these measure 7.1 x 4.4 x 4.1 cm.  Left ovary is normal.  Bowel is normal.  Trace pelvic free fluid noted.  Minimal peri cecal stranding is again noted.  No acute osseous finding.  IMPRESSION: Increasing right ovarian/adnexal cystic/tubular structure.  This may represent tubal ovarian abscess or abscess complicating prior appendicitis. Hemorrhagic cyst or bland hydrosalpinx is considered less likely.  Original Report Authenticated By: Harrel Lemon, M.D.    Problem List: Active Problems:  * No active hospital problems. *    Assessment & Plan: Evolving abscess; patient feels like she can go home.  Would give Augmentin 875 BID and have her seen at [facility] on Friday for evaluation or repeat CT scan to assess drainability of this process.      Matt B. Daphine Deutscher, MD, [facility] [phone] beeper [phone]  09/21/2011 10:16 PM

## 2011-09-21 NOTE — ED Notes (Signed)
Pt reports RUQ pain with n/v since yesterday.  Pt reports vomiting x 1 yesterday and x 1 today.  Denies any diarrhea.  Pt reports pain radiates to her R shoulder and R leg.

## 2011-09-21 NOTE — ED Notes (Signed)
Newly recollected urine resent to down to lab

## 2011-09-21 NOTE — Consult Note (Signed)
Chief Complaint:  Abdominal pain; 4 weeks out from perforated appendicitis.  History of Present Illness:  Melanie Rodriguez is an 25 y.o. female who presented  to the ED and a complete workup by Dr. Alto Denver including the CT scan listed below, a pelvic ultrasound, and a pelvic exam showed an inflammatory mass in the lower abdomen.  To me this looks too high to be tuboovarian and it appears as a phlegmon although it is probably an evolving abscess.  Presently she has some RLQ abdominal pain.    History reviewed. No pertinent past medical history.  Past Surgical History  Procedure Date  . Appendectomy     Medications Prior to Admission  Medication Dose Route Frequency Provider Last Rate Last Dose  . HYDROmorphone (DILAUDID) 2 MG/ML injection        1 mg at 09/21/11 1425  . HYDROmorphone (DILAUDID) injection 1 mg  1 mg Intravenous Once Cyndra Numbers, MD   1 mg at 09/21/11 2030  . iohexol (OMNIPAQUE) 300 MG/ML injection 80 mL  80 mL Intravenous Once PRN Medication Radiologist   80 mL at 09/21/11 1616  . ondansetron (ZOFRAN) injection 4 mg  4 mg Intravenous Once Peter A. Tucich, MD   4 mg at 09/21/11 1425  . piperacillin-tazobactam (ZOSYN) IVPB 3.375 g  3.375 g Intravenous Once Cyndra Numbers, MD   3.375 g at 09/21/11 2131  . sodium chloride 0.9 % bolus 1,000 mL  1,000 mL Intravenous Once Peter A. Tucich, MD   1,000 mL at 09/21/11 1429  . DISCONTD: HYDROmorphone (DILAUDID) injection 1 mg  1 mg Intravenous Once Peter A. Patrica Duel, MD      . DISCONTD: morphine 4 MG/ML injection 4 mg  4 mg Intravenous Once Cyndra Numbers, MD       Medications Prior to Admission  Medication Sig Dispense Refill  . ibuprofen (ADVIL,MOTRIN) 200 MG tablet Take 200 mg by mouth every 6 (six) hours as needed.         No Known Allergies No family history on file. Social History:   reports that she has never smoked. She does not have any smokeless tobacco history on file. She reports that she does not drink alcohol or use illicit  drugs.   REVIEW OF SYSTEMS - PERTINENT POSITIVES ONLY: Not contributory  Physical Exam:   Blood pressure 123/73, pulse 100, temperature 100.3 F (37.9 C), temperature source Oral, resp. rate 20, weight 137 lb (62.143 kg), last menstrual period 09/05/2010, SpO2 97.00%. There is no height on file to calculate BMI.  Gen:  No acute distress.   Neurological: Alert and oriented to person, place, and time. Speaks some English and understands.  Head: Normocephalic and atraumatic.  Eyes: Conjunctivae are normal. Pupils are equal, round, and reactive to light. No scleral icterus.  Neck: Normal range of motion. Neck supple. No tracheal deviation or thyromegaly present.  Cardiovascular:  SR without murmurs or gallops Respiratory: Effort normal.  No respiratory distress. No chest wall tenderness. Breath sounds normal.  No wheezes, rales or rhonchi.  GI: Flat abdomen.  Incisions healed from laparoscopy.  Tender to palpation to the right and below umbilicus GU:  Musculoskeletal: Normal range of motion. Extremities are nontender.  Lymphadenopathy: No cervical, preauricular, postauricular or axillary adenopathy is present Skin: Skin is warm and dry. No rash noted. No diaphoresis. No erythema. No pallor. No clubbing, cyanosis, or edema.  Pscyh: Normal mood and affect. Behavior is normal. Judgment and thought content normal.   LABORATORY RESULTS: Results for orders  placed during the hospital encounter of 09/21/11 (from the past 48 hour(s))  POCT PREGNANCY, URINE     Status: Normal   Collection Time   09/21/11 11:33 AM      Component Value Range Comment   Preg Test, Ur NEGATIVE     URINALYSIS, ROUTINE W REFLEX MICROSCOPIC     Status: Abnormal   Collection Time   09/21/11 12:30 PM      Component Value Range Comment   Color, Urine YELLOW  YELLOW     APPearance CLEAR  CLEAR     Specific Gravity, Urine 1.016  1.005 - 1.030     pH 6.0  5.0 - 8.0     Glucose, UA NEGATIVE  NEGATIVE (mg/dL)    Hgb urine  dipstick NEGATIVE  NEGATIVE     Bilirubin Urine NEGATIVE  NEGATIVE     Ketones, ur NEGATIVE  NEGATIVE (mg/dL)    Protein, ur NEGATIVE  NEGATIVE (mg/dL)    Urobilinogen, UA 0.2  0.0 - 1.0 (mg/dL)    Nitrite NEGATIVE  NEGATIVE     Leukocytes, UA SMALL (*) NEGATIVE    URINE MICROSCOPIC-ADD ON     Status: Abnormal   Collection Time   09/21/11 12:30 PM      Component Value Range Comment   Squamous Epithelial / LPF FEW (*) RARE     WBC, UA 3-6  <3 (WBC/hpf)    Bacteria, UA FEW (*) RARE     Urine-Other MUCOUS PRESENT     CBC     Status: Abnormal   Collection Time   09/21/11  2:10 PM      Component Value Range Comment   WBC 14.9 (*) 4.0 - 10.5 (K/uL)    RBC 4.29  3.87 - 5.11 (MIL/uL)    Hemoglobin 11.6 (*) 12.0 - 15.0 (g/dL)    HCT 16.1 (*) 09.6 - 46.0 (%)    MCV 82.1  78.0 - 100.0 (fL)    MCH 27.0  26.0 - 34.0 (pg)    MCHC 33.0  30.0 - 36.0 (g/dL)    RDW 04.5  40.9 - 81.1 (%)    Platelets 350  150 - 400 (K/uL)   DIFFERENTIAL     Status: Abnormal   Collection Time   09/21/11  2:10 PM      Component Value Range Comment   Neutrophils Relative 81 (*) 43 - 77 (%)    Neutro Abs 12.1 (*) 1.7 - 7.7 (K/uL)    Lymphocytes Relative 12  12 - 46 (%)    Lymphs Abs 1.8  0.7 - 4.0 (K/uL)    Monocytes Relative 6  3 - 12 (%)    Monocytes Absolute 0.9  0.1 - 1.0 (K/uL)    Eosinophils Relative 0  0 - 5 (%)    Eosinophils Absolute 0.1  0.0 - 0.7 (K/uL)    Basophils Relative 0  0 - 1 (%)    Basophils Absolute 0.0  0.0 - 0.1 (K/uL)   COMPREHENSIVE METABOLIC PANEL     Status: Abnormal   Collection Time   09/21/11  2:10 PM      Component Value Range Comment   Sodium 136  135 - 145 (mEq/L)    Potassium 3.7  3.5 - 5.1 (mEq/L)    Chloride 101  96 - 112 (mEq/L)    CO2 24  19 - 32 (mEq/L)    Glucose, Bld 82  70 - 99 (mg/dL)    BUN 6  6 - 23 (  mg/dL)    Creatinine, Ser 8.29 (*) 0.50 - 1.10 (mg/dL)    Calcium 9.6  8.4 - 10.5 (mg/dL)    Total Protein 8.6 (*) 6.0 - 8.3 (g/dL)    Albumin 3.7  3.5 - 5.2  (g/dL)    AST 17  0 - 37 (U/L)    ALT 20  0 - 35 (U/L)    Alkaline Phosphatase 104  39 - 117 (U/L)    Total Bilirubin 0.6  0.3 - 1.2 (mg/dL)    GFR calc non Af Amer >90  >90 (mL/min)    GFR calc Af Amer >90  >90 (mL/min)   LIPASE, BLOOD     Status: Normal   Collection Time   09/21/11  2:10 PM      Component Value Range Comment   Lipase 24  11 - 59 (U/L)   WET PREP, GENITAL     Status: Abnormal   Collection Time   09/21/11  5:36 PM      Component Value Range Comment   Yeast, Wet Prep NONE SEEN  NONE SEEN     Trich, Wet Prep NONE SEEN  NONE SEEN     Clue Cells, Wet Prep NONE SEEN  NONE SEEN     WBC, Wet Prep HPF POC MODERATE (*) NONE SEEN      RADIOLOGY RESULTS: US Transvaginal Non-ob  09/21/2011  *RADIOLOGY REPORT*  Clinical Data: Right lower quadrant pelvic pain  TRANSABDOMINAL AND TRANSVAGINAL ULTRASOUND OF PELVIS Technique:  Both transabdominal and transvaginal ultrasound examinations of the pelvis were performed. Transabdominal technique was performed for global imaging of the pelvis including uterus, ovaries, adnexal regions, and pelvic cul-de-sac.  Comparison: CT same date   It was necessary to proceed with endovaginal exam following the transabdominal exam to visualize the right adnexa and endometrium.  Findings:  Uterus: Retroverted, retroflexed.  7.5 x 5.4 x 5.0 cm.  No focal abnormality.  Endometrium: 9 mm.  Uniformly thin and echogenic.  Right ovary:  6.4 x 6.2 x 5.0 cm.  Cyst with reticular lace-like echoes measures 3.3 x 3.0 x 2.4 cm.  There may be a collapsing cyst adjacent to this within the right ovary, which could have accounted for the previous appearance on separate examination.  No hydrosalpinx is visualized.  Left ovary: 2.9 x 1.8 x 1.4 cm.  Normal.  Other findings: No free fluid  IMPRESSION: Probable right ovarian hemorrhagic cyst.  This accounts for the finding on the prior examination and no hydrosalpinx is identified. This may account for the patient's pain.  Short-interval follow up ultrasound in 6-12 weeks is recommended, preferably during the week following the patient's normal menses.  These results were called by telephone on 09/21/2011  at  7:45 p.m. to  Dr. Alto Denver, who verbally acknowledged these results.  Original Report Authenticated By: Harrel Lemon, M.D.   US Pelvis Complete  09/21/2011  *RADIOLOGY REPORT*  Clinical Data: Right lower quadrant pelvic pain  TRANSABDOMINAL AND TRANSVAGINAL ULTRASOUND OF PELVIS Technique:  Both transabdominal and transvaginal ultrasound examinations of the pelvis were performed. Transabdominal technique was performed for global imaging of the pelvis including uterus, ovaries, adnexal regions, and pelvic cul-de-sac.  Comparison: CT same date   It was necessary to proceed with endovaginal exam following the transabdominal exam to visualize the right adnexa and endometrium.  Findings:  Uterus: Retroverted, retroflexed.  7.5 x 5.4 x 5.0 cm.  No focal abnormality.  Endometrium: 9 mm.  Uniformly thin and echogenic.  Right ovary:  6.4 x 6.2 x 5.0 cm.  Cyst with reticular lace-like echoes measures 3.3 x 3.0 x 2.4 cm.  There may be a collapsing cyst adjacent to this within the right ovary, which could have accounted for the previous appearance on separate examination.  No hydrosalpinx is visualized.  Left ovary: 2.9 x 1.8 x 1.4 cm.  Normal.  Other findings: No free fluid  IMPRESSION: Probable right ovarian hemorrhagic cyst.  This accounts for the finding on the prior examination and no hydrosalpinx is identified. This may account for the patient's pain. Short-interval follow up ultrasound in 6-12 weeks is recommended, preferably during the week following the patient's normal menses.  These results were called by telephone on 09/21/2011  at  7:45 p.m. to  Dr. Alto Denver, who verbally acknowledged these results.  Original Report Authenticated By: Harrel Lemon, M.D.   Ct Abdomen Pelvis W Contrast  09/21/2011  *RADIOLOGY REPORT*   Clinical Data: Right upper quadrant and right lower quadrant abdominal pain.  CT ABDOMEN AND PELVIS WITH CONTRAST  Technique:  Multidetector CT imaging of the abdomen and pelvis was performed following the standard protocol during bolus administration of intravenous contrast.  Contrast: 80mL OMNIPAQUE IOHEXOL 300 MG/ML IV SOLN  Comparison: 09/06/2011  Findings: Lung bases are clear.  Abdominal viscera are normal.  No lymphadenopathy or ascites.  No free air.  Evidence of prior appendectomy noted.  Right ovarian cyst and adjacent tubular right adnexal structure noted.  Together these measure 7.1 x 4.4 x 4.1 cm.  Left ovary is normal.  Bowel is normal.  Trace pelvic free fluid noted.  Minimal peri cecal stranding is again noted.  No acute osseous finding.  IMPRESSION: Increasing right ovarian/adnexal cystic/tubular structure.  This may represent tubal ovarian abscess or abscess complicating prior appendicitis. Hemorrhagic cyst or bland hydrosalpinx is considered less likely.  Original Report Authenticated By: Harrel Lemon, M.D.    Problem List: Active Problems:  * No active hospital problems. *    Assessment & Plan: Evolving abscess; patient feels like she can go home.  Would give Augmentin 875 BID and have her seen at [facility] on Friday for evaluation or repeat CT scan to assess drainability of this process.      Matt B. Daphine Deutscher, MD, [facility] [phone] beeper [phone]  09/21/2011 10:07 PM

## 2011-09-22 ENCOUNTER — Telehealth (INDEPENDENT_AMBULATORY_CARE_PROVIDER_SITE_OTHER): Payer: Self-pay | Admitting: General Surgery

## 2011-09-22 DIAGNOSIS — R1031 Right lower quadrant pain: Secondary | ICD-10-CM

## 2011-09-22 LAB — GC/CHLAMYDIA PROBE AMP, GENITAL
Chlamydia, DNA Probe: NEGATIVE
GC Probe Amp, Genital: NEGATIVE

## 2011-09-22 NOTE — Telephone Encounter (Signed)
Patient called status post appendectomy on 08/12/11 by Dr Abbey Chatters. Patient went to the ER last night and was consulted by Dr Daphine Deutscher. Patient was having right lower quadrant pain and had a CT which showed possible abscess at appendectomy site and was advised to follow up with [place] in 24-48 hours. I asked Dr Ermalene Searing opinion who advised for patient to follow up with Dr Abbey Chatters if he has a clinic tomorrow and if not to get a CT scan of her abdomen and pelvis tomorrow am and for her to follow up with urg office doctor tomorrow pm. I made appt with Dr Dwain Sarna tomorrow and put in order for CT to have Stanton Kidney, our referral coordinator call.

## 2011-09-22 NOTE — ED Provider Notes (Signed)
Patient was evaluated by myself following signout. Patient had CT of the abdomen and pelvis that was concerning for a possible right tubo-ovarian lesion versus related appendiceal abscess. Pelvic exam was performed by myself. Patient had right adnexal tenderness. However her tenderness is more pronounced on external palpation on internal palpation. There is a slight amount of white discharge in the vaginal vault. There is no cervical motion tenderness. There is no left-sided adnexal tenderness. Samples were collected. Patient had a transvaginal ultrasound ordered to further characterize the patient's ovary and fallopian tubes. This identified a 3 cm hemorrhagic cyst. Patient also had a wet prep which was essentially unremarkable. Patient's exam had been inconsistent with infection transmitted disease. Patient was reassessed and continued to have guarding on my exam. Given that this was unlikely due to the hemorrhagic cystic consult surgery. Dr. Daphine Deutscher evaluated the patient. He recommended providing a dose of Zosyn here. He also recommended discharge with Augmentin. Patient is to be seen in clinic in 24-48 hours. At that time repeat scan likely will be performed and decision will be made regarding whether possible intervention is required. Patient was discharged home in good condition with prescription for Percocet as well as Augmentin. She was given strict precautions to return if she had worsening of her pain, nausea or vomiting, development of fevers, or other emergent concerns. Patient was comfortable with plan for discharge home. She was discharged in good condition.  Cyndra Numbers, MD 09/22/11 0111

## 2011-09-23 ENCOUNTER — Inpatient Hospital Stay (HOSPITAL_COMMUNITY): Admission: RE | Admit: 2011-09-23 | Payer: Self-pay | Source: Ambulatory Visit

## 2011-09-23 ENCOUNTER — Encounter (INDEPENDENT_AMBULATORY_CARE_PROVIDER_SITE_OTHER): Payer: Self-pay | Admitting: General Surgery

## 2011-09-27 ENCOUNTER — Telehealth (INDEPENDENT_AMBULATORY_CARE_PROVIDER_SITE_OTHER): Payer: Self-pay | Admitting: Surgery

## 2011-09-27 NOTE — Telephone Encounter (Signed)
Calling you back to make an appointment, please call.

## 2011-09-28 ENCOUNTER — Emergency Department (HOSPITAL_COMMUNITY)
Admission: EM | Admit: 2011-09-28 | Discharge: 2011-09-28 | Disposition: A | Discharge status: Home / Self Care | Payer: Self-pay | Attending: Emergency Medicine | Admitting: Emergency Medicine

## 2011-09-28 ENCOUNTER — Encounter (HOSPITAL_COMMUNITY): Payer: Self-pay | Admitting: Emergency Medicine

## 2011-09-28 ENCOUNTER — Emergency Department (HOSPITAL_COMMUNITY): Payer: Self-pay

## 2011-09-28 DIAGNOSIS — R10813 Right lower quadrant abdominal tenderness: Secondary | ICD-10-CM | POA: Insufficient documentation

## 2011-09-28 DIAGNOSIS — Z9089 Acquired absence of other organs: Secondary | ICD-10-CM | POA: Insufficient documentation

## 2011-09-28 DIAGNOSIS — K651 Peritoneal abscess: Secondary | ICD-10-CM | POA: Insufficient documentation

## 2011-09-28 DIAGNOSIS — R509 Fever, unspecified: Secondary | ICD-10-CM | POA: Insufficient documentation

## 2011-09-28 DIAGNOSIS — R197 Diarrhea, unspecified: Secondary | ICD-10-CM | POA: Insufficient documentation

## 2011-09-28 DIAGNOSIS — R109 Unspecified abdominal pain: Secondary | ICD-10-CM | POA: Insufficient documentation

## 2011-09-28 LAB — COMPREHENSIVE METABOLIC PANEL
ALT: 31 U/L (ref 0–35)
AST: 23 U/L (ref 0–37)
Calcium: 10.1 mg/dL (ref 8.4–10.5)
Creatinine, Ser: 0.46 mg/dL — ABNORMAL LOW (ref 0.50–1.10)
GFR calc Af Amer: >90 mL/min (ref >90)
GFR calc non Af Amer: >90 mL/min (ref >90)
Sodium: 137 mEq/L (ref 135–145)
Total Protein: 9 g/dL — ABNORMAL HIGH (ref 6.0–8.3)

## 2011-09-28 LAB — URINALYSIS, ROUTINE W REFLEX MICROSCOPIC
Bilirubin Urine: NEGATIVE
Ketones, ur: NEGATIVE mg/dL
Nitrite: NEGATIVE
Protein, ur: NEGATIVE mg/dL
Specific Gravity, Urine: 1.018 (ref 1.005–1.030)
Urobilinogen, UA: 1 mg/dL (ref 0.0–1.0)

## 2011-09-28 LAB — CBC
HCT: 31.4 % — ABNORMAL LOW (ref 36.0–46.0)
MCH: 26.8 pg (ref 26.0–34.0)
MCHC: 33.1 g/dL (ref 30.0–36.0)
MCV: 80.9 fL (ref 78.0–100.0)
Platelets: 408 10*3/uL — ABNORMAL HIGH (ref 150–400)
RDW: 14.2 % (ref 11.5–15.5)

## 2011-09-28 LAB — DIFFERENTIAL
Basophils Absolute: 0 10*3/uL (ref 0.0–0.1)
Eosinophils Absolute: 0.2 10*3/uL (ref 0.0–0.7)
Eosinophils Relative: 2 % (ref 0–5)
Monocytes Absolute: 1 10*3/uL (ref 0.1–1.0)

## 2011-09-28 MED ORDER — OXYCODONE-ACETAMINOPHEN 5-325 MG PO TABS: 1.0000 | ORAL_TABLET | ORAL | Status: AC | PRN

## 2011-09-28 MED ORDER — IOHEXOL 300 MG/ML  SOLN
100.0000 mL | Freq: Once | INTRAMUSCULAR | Status: AC | PRN
  Administered 2011-09-28: 100 mL via INTRAVENOUS

## 2011-09-28 NOTE — ED Provider Notes (Signed)
History     CSN: [id number] Arrival date & time: 09/28/2011  3:22 PM   First MD Initiated Contact with Patient 09/28/11 1752      Chief Complaint  Patient presents with  . Abdominal Pain    (Consider location/radiation/quality/duration/timing/severity/associated sxs/prior treatment) HPI Comments: He should presents with persistent right-sided abdominal pain that has been ongoing since she was seen in October for a ruptured appendicitis.  She has been seen here several times last few weeks with question of possible abscess related to that appendicitis process.  She was last seen here on November 28 and had both a CAT scan and a pelvic ultrasound completed to evaluate for both gynecologic causes of her pain and abscess.  On her pelvic exam at that time it did not demonstrate any signs of pelvic inflammatory disease nor did her cultures come back positive.  Patient did not have signs of tubo-ovarian abscess on the ultrasound.  Consultation surgery was completed and they recommended followup in the next one to 2 days in the clinic and possible repeat CAT scan to evaluate for possible abscess.  Looking through the medical records patient was not seen in the clinic and did not have the repeat CAT scan completed.  Patient states that her pain has remained the same it is not worse or better here today.  She is now out of her Percocet which she was given at time of discharge.  She was given a prescription for Augmentin which he filled and is still currently taking.  Patient notes she's had subjective fevers intermittently over the last week and has associated nausea as well.  Patient is a 25 y.o. female presenting with abdominal pain. The history is provided by medical records. No language interpreter was used.  Abdominal Pain The primary symptoms of the illness include abdominal pain, fever, nausea and diarrhea. The primary symptoms of the illness do not include fatigue, shortness of breath, vomiting,  dysuria, vaginal discharge or vaginal bleeding. The current episode started more than 2 days ago. The onset of the illness was gradual. The problem has not changed since onset. Symptoms associated with the illness do not include chills or back pain.    History reviewed. No pertinent past medical history.  Past Surgical History  Procedure Date  . Appendectomy     No family history on file.  History  Substance Use Topics  . Smoking status: Never Smoker   . Smokeless tobacco: Not on file  . Alcohol Use: No    OB History    Grav Para Term Preterm Abortions TAB SAB Ect Mult Living                  Review of Systems  Constitutional: Positive for fever. Negative for chills and fatigue.  HENT: Negative.   Eyes: Negative.  Negative for discharge and redness.  Respiratory: Negative.  Negative for cough and shortness of breath.   Cardiovascular: Negative.  Negative for chest pain.  Gastrointestinal: Positive for nausea, abdominal pain and diarrhea. Negative for vomiting.  Genitourinary: Negative.  Negative for dysuria, vaginal bleeding and vaginal discharge.  Musculoskeletal: Negative.  Negative for back pain.  Skin: Negative.  Negative for color change and rash.  Neurological: Negative.  Negative for syncope and headaches.  Hematological: Negative.  Negative for adenopathy.  Psychiatric/Behavioral: Negative.  Negative for confusion.  All other systems reviewed and are negative.    Allergies  Review of patient's allergies indicates no known allergies.  Home Medications  Current Outpatient Rx  Name Route Sig Dispense Refill  . AMOXICILLIN-POT CLAVULANATE 875-125 MG PO TABS Oral Take 1 tablet by mouth every 12 (twelve) hours. 10 day course of therapy; not completed.     . IBUPROFEN 200 MG PO TABS Oral Take 400 mg by mouth every 6 (six) hours as needed. For pain.    . OXYCODONE-ACETAMINOPHEN 5-325 MG PO TABS Oral Take 2 tablets by mouth every 6 (six) hours as needed for pain.  30 tablet 0    BP 117/69  Pulse 95  Temp(Src) 99.8 F (37.7 C) (Oral)  Resp 20  SpO2 100%  LMP 09/05/2010  Physical Exam  Constitutional: She is oriented to person, place, and time. She appears well-developed and well-nourished.  Non-toxic appearance. She does not have a sickly appearance.  HENT:  Head: Normocephalic and atraumatic.  Eyes: Conjunctivae, EOM and lids are normal. Pupils are equal, round, and reactive to light. No scleral icterus.  Neck: Trachea normal and normal range of motion. Neck supple.  Cardiovascular: Normal rate, regular rhythm and normal heart sounds.   Pulmonary/Chest: Effort normal and breath sounds normal. No respiratory distress. She has no wheezes. She has no rales.  Abdominal: Soft. Normal appearance. There is tenderness in the right lower quadrant. There is no rebound, no guarding and no CVA tenderness.  Musculoskeletal: Normal range of motion.  Neurological: She is alert and oriented to person, place, and time. She has normal strength.  Skin: Skin is warm, dry and intact. No rash noted.  Psychiatric: She has a normal mood and affect. Her behavior is normal. Judgment and thought content normal.    ED Course  Procedures (including critical care time)  Results for orders placed during the hospital encounter of 09/28/11  URINALYSIS, ROUTINE W REFLEX MICROSCOPIC      Component Value Range   Color, Urine YELLOW  YELLOW    APPearance CLEAR  CLEAR    Specific Gravity, Urine 1.018  1.005 - 1.030    pH 7.0  5.0 - 8.0    Glucose, UA NEGATIVE  NEGATIVE (mg/dL)   Hgb urine dipstick NEGATIVE  NEGATIVE    Bilirubin Urine NEGATIVE  NEGATIVE    Ketones, ur NEGATIVE  NEGATIVE (mg/dL)   Protein, ur NEGATIVE  NEGATIVE (mg/dL)   Urobilinogen, UA 1.0  0.0 - 1.0 (mg/dL)   Nitrite NEGATIVE  NEGATIVE    Leukocytes, UA NEGATIVE  NEGATIVE   POCT PREGNANCY, URINE      Component Value Range   Preg Test, Ur NEGATIVE    CBC      Component Value Range   WBC 11.3 (*)  4.0 - 10.5 (K/uL)   RBC 3.88  3.87 - 5.11 (MIL/uL)   Hemoglobin 10.4 (*) 12.0 - 15.0 (g/dL)   HCT 16.1 (*) 09.6 - 46.0 (%)   MCV 80.9  78.0 - 100.0 (fL)   MCH 26.8  26.0 - 34.0 (pg)   MCHC 33.1  30.0 - 36.0 (g/dL)   RDW 04.5  40.9 - 81.1 (%)   Platelets 408 (*) 150 - 400 (K/uL)  DIFFERENTIAL      Component Value Range   Neutrophils Relative 69  43 - 77 (%)   Neutro Abs 7.8 (*) 1.7 - 7.7 (K/uL)   Lymphocytes Relative 20  12 - 46 (%)   Lymphs Abs 2.2  0.7 - 4.0 (K/uL)   Monocytes Relative 8  3 - 12 (%)   Monocytes Absolute 1.0  0.1 - 1.0 (K/uL)   Eosinophils  Relative 2  0 - 5 (%)   Eosinophils Absolute 0.2  0.0 - 0.7 (K/uL)   Basophils Relative 0  0 - 1 (%)   Basophils Absolute 0.0  0.0 - 0.1 (K/uL)  COMPREHENSIVE METABOLIC PANEL      Component Value Range   Sodium 137  135 - 145 (mEq/L)   Potassium 3.9  3.5 - 5.1 (mEq/L)   Chloride 101  96 - 112 (mEq/L)   CO2 25  19 - 32 (mEq/L)   Glucose, Bld 84  70 - 99 (mg/dL)   BUN 6  6 - 23 (mg/dL)   Creatinine, Ser 1.61 (*) 0.50 - 1.10 (mg/dL)   Calcium 09.6  8.4 - 10.5 (mg/dL)   Total Protein 9.0 (*) 6.0 - 8.3 (g/dL)   Albumin 3.5  3.5 - 5.2 (g/dL)   AST 23  0 - 37 (U/L)   ALT 31  0 - 35 (U/L)   Alkaline Phosphatase 122 (*) 39 - 117 (U/L)   Total Bilirubin 0.3  0.3 - 1.2 (mg/dL)   GFR calc non Af Amer >90  >90 (mL/min)   GFR calc Af Amer >90  >90 (mL/min)   US Transvaginal Non-ob  09/21/2011  *RADIOLOGY REPORT*  Clinical Data: Right lower quadrant pelvic pain  TRANSABDOMINAL AND TRANSVAGINAL ULTRASOUND OF PELVIS Technique:  Both transabdominal and transvaginal ultrasound examinations of the pelvis were performed. Transabdominal technique was performed for global imaging of the pelvis including uterus, ovaries, adnexal regions, and pelvic cul-de-sac.  Comparison: CT same date   It was necessary to proceed with endovaginal exam following the transabdominal exam to visualize the right adnexa and endometrium.  Findings:  Uterus:  Retroverted, retroflexed.  7.5 x 5.4 x 5.0 cm.  No focal abnormality.  Endometrium: 9 mm.  Uniformly thin and echogenic.  Right ovary:  6.4 x 6.2 x 5.0 cm.  Cyst with reticular lace-like echoes measures 3.3 x 3.0 x 2.4 cm.  There may be a collapsing cyst adjacent to this within the right ovary, which could have accounted for the previous appearance on separate examination.  No hydrosalpinx is visualized.  Left ovary: 2.9 x 1.8 x 1.4 cm.  Normal.  Other findings: No free fluid  IMPRESSION: Probable right ovarian hemorrhagic cyst.  This accounts for the finding on the prior examination and no hydrosalpinx is identified. This may account for the patient's pain. Short-interval follow up ultrasound in 6-12 weeks is recommended, preferably during the week following the patient's normal menses.  These results were called by telephone on 09/21/2011  at  7:45 p.m. to  Dr. Alto Denver, who verbally acknowledged these results.  Original Report Authenticated By: Harrel Lemon, M.D.   US Pelvis Complete  09/21/2011  *RADIOLOGY REPORT*  Clinical Data: Right lower quadrant pelvic pain  TRANSABDOMINAL AND TRANSVAGINAL ULTRASOUND OF PELVIS Technique:  Both transabdominal and transvaginal ultrasound examinations of the pelvis were performed. Transabdominal technique was performed for global imaging of the pelvis including uterus, ovaries, adnexal regions, and pelvic cul-de-sac.  Comparison: CT same date   It was necessary to proceed with endovaginal exam following the transabdominal exam to visualize the right adnexa and endometrium.  Findings:  Uterus: Retroverted, retroflexed.  7.5 x 5.4 x 5.0 cm.  No focal abnormality.  Endometrium: 9 mm.  Uniformly thin and echogenic.  Right ovary:  6.4 x 6.2 x 5.0 cm.  Cyst with reticular lace-like echoes measures 3.3 x 3.0 x 2.4 cm.  There may be a collapsing cyst adjacent to this  within the right ovary, which could have accounted for the previous appearance on separate examination.  No  hydrosalpinx is visualized.  Left ovary: 2.9 x 1.8 x 1.4 cm.  Normal.  Other findings: No free fluid  IMPRESSION: Probable right ovarian hemorrhagic cyst.  This accounts for the finding on the prior examination and no hydrosalpinx is identified. This may account for the patient's pain. Short-interval follow up ultrasound in 6-12 weeks is recommended, preferably during the week following the patient's normal menses.  These results were called by telephone on 09/21/2011  at  7:45 p.m. to  Dr. Alto Denver, who verbally acknowledged these results.  Original Report Authenticated By: Harrel Lemon, M.D.   Ct Abdomen Pelvis W Contrast  09/28/2011  *RADIOLOGY REPORT*  Clinical Data: Status post appendectomy left ovary; persistent abdominal pain and suspected abscess, not improving on antibiotics.  CT ABDOMEN AND PELVIS WITH CONTRAST  Technique:  Multidetector CT imaging of the abdomen and pelvis was performed following the standard protocol during bolus administration of intravenous contrast.  Contrast: OMNIPAQUE IOHEXOL 300 MG/ML IV SOLN  Comparison: Prior CTs of the abdomen and pelvis, the most recent of which was performed 09/21/2011, and pelvic ultrasound performed 09/21/2011.  Findings: The visualized lung bases are clear.  The liver and spleen are unremarkable in appearance.  The gallbladder is within normal limits.  The pancreas and adrenal glands are unremarkable.  Mild bilateral fetal lobulations are noted.  The kidneys are unremarkable in appearance.  There is no evidence of hydronephrosis.  No renal or ureteral stones are seen.  No perinephric stranding is appreciated.  The small bowel is unremarkable in appearance.  The stomach is within normal limits.  No acute vascular abnormalities are seen.  There has been mild interval increase in the size of the complex loculated fluid collection occupying the expected location of the right ovary, with diffuse peripheral enhancement and soft tissue inflammation  tracking about the rectum. This now measures approximately 6.1 x 5.8 x 4.4 cm, and given its appearance on CT, remains most concerning for a tubo-ovarian abscess.  Given the prior ultrasound, it is possible that a hemorrhagic cyst could have this appearance, but its features, the surrounding soft tissue inflammation and the degree of loculation makes a hemorrhagic cyst less likely.  There has been slight interval improvement in the degree of retroperitoneal soft tissue inflammation status post appendectomy, though mildly increased soft tissue inflammation is noted at the right lower quadrant and pelvis since the prior study.  No significant free fluid is seen to suggest leak.  The colon is grossly unremarkable in appearance.  The bladder is mildly distended; mild wall thickening likely reflects the adjacent inflammatory process.  The uterus is displaced to the left, and also appears mildly inflamed, with trace surrounding fluid.  The left ovary is not well assessed but appears grossly unremarkable.  No inguinal lymphadenopathy is seen.  No acute osseous abnormalities are identified.  IMPRESSION:  1.  Mild interval increase in size of complex loculated peripherally enhancing fluid collection at the right ovary, now measuring 6.1 x 5.8 x 4.4 cm.  Given surrounding soft tissue inflammation, the degree of loculation and associated findings, this remains most suspicious for tubo-ovarian abscess.  Per the prior ultrasound, it remains possible that a hemorrhagic cyst could have this appearance, but this is considered significantly less likely. 2.  Mildly increased soft tissue inflammation noted at the right lower quadrant and pelvis; mild interval improvement in soft tissue inflammation more superiorly  in the retroperitoneum. 3.  Mild wall thickening involving the bladder, likely reflecting the adjacent inflammatory process; mild inflammation with respect to the displaced uterus.  Findings were discussed with Dr. Emeline General at 09:14 p.m. on 09/28/2011.  Original Report Authenticated By: Tonia Ghent, M.D.   Ct Abdomen Pelvis W Contrast  09/21/2011  *RADIOLOGY REPORT*  Clinical Data: Right upper quadrant and right lower quadrant abdominal pain.  CT ABDOMEN AND PELVIS WITH CONTRAST  Technique:  Multidetector CT imaging of the abdomen and pelvis was performed following the standard protocol during bolus administration of intravenous contrast.  Contrast: 80mL OMNIPAQUE IOHEXOL 300 MG/ML IV SOLN  Comparison: 09/06/2011  Findings: Lung bases are clear.  Abdominal viscera are normal.  No lymphadenopathy or ascites.  No free air.  Evidence of prior appendectomy noted.  Right ovarian cyst and adjacent tubular right adnexal structure noted.  Together these measure 7.1 x 4.4 x 4.1 cm.  Left ovary is normal.  Bowel is normal.  Trace pelvic free fluid noted.  Minimal peri cecal stranding is again noted.  No acute osseous finding.  IMPRESSION: Increasing right ovarian/adnexal cystic/tubular structure.  This may represent tubal ovarian abscess or abscess complicating prior appendicitis. Hemorrhagic cyst or bland hydrosalpinx is considered less likely.  Original Report Authenticated By: Harrel Lemon, M.D.   Ct Abdomen Pelvis W Contrast  09/06/2011  *RADIOLOGY REPORT*  Clinical Data: 25 year old female with perforated appendicitis - status post laparoscopic appendectomy 3 weeks ago.  Continued right abdominal and pelvic pain.  CT ABDOMEN AND PELVIS WITH CONTRAST  Technique:  Multidetector CT imaging of the abdomen and pelvis was performed following the standard protocol during bolus administration of intravenous contrast.  Contrast: OMNIPAQUE IOHEXOL 300 MG/ML IV SOLN  Comparison: 08/12/2011  Findings: Faint areas of decreased cortical attenuation within both kidneys are noted and pyelonephritis is not excluded. There is no evidence of hydronephrosis, urinary calculi or renal abscess/mass.  The liver, spleen, pancreas,  adrenal glands and gallbladder are unremarkable.  Mild inflammatory changes within the right lower abdomen and pelvis are noted and likely postsurgical or residual changes from perforated appendicitis. There is no evidence of postoperative abscess, pneumoperitoneum or bowel obstruction. The bladder is within normal limits. A 2.2 cm right ovarian cyst / follicle is present. A trace amount of free fluid within the pelvis may be postsurgical or physiologic.  No acute or suspicious bony abnormalities are identified.  IMPRESSION: Very subtle areas of decreased cortical attenuation within both kidneys - pyelonephritis is not excluded.  Mild inflammatory changes in the right lower abdomen and pelvis - likely postsurgical and residual changes from prior perforated appendicitis.  No evidence of abscess.  Original Report Authenticated By: Rosendo Gros, M.D.       MDM  Patient presents with symptoms concerning for a continued abscess in her right lower quadrant likely related to her prior ruptured appendicitis from October of this year.  Patient has been compliant with taking her Augmentin at home.  She did not followup with the surgeons last week as she was instructed to do and I have rediscussed this patient with Dr. Daphine Deutscher.  Dr. Daphine Deutscher I believe the patient likely needs CT-guided drainage of this abscess with continued followup in their office.  Given the patient has normal vital signs and does not appear toxic I feel that she can be safely discharged and he will call her tomorrow morning to arrange the CT guided drainage and continued followup in his office.  Patient will  continue on her Augmentin at home and I will give her a refill for pain medicine she can use as well and careful instructions to return for worsening pain here to the emergency department.        Nat Christen, MD 09/28/11 2158

## 2011-09-28 NOTE — ED Notes (Signed)
Pt states she was seen here last Thursday for a uti.  Was given amox-clav 875 mg & percocet.  Has finished percocet but is still in pain.  States she has had fevers.  Denies v/d but has had nausea.

## 2011-09-28 NOTE — ED Notes (Signed)
Pt has llq abd pain that radates to rt lower back was seen last thurs and had a ultrasound done, she had some bloody discharge after that and was unsure if it was her cycle, pt had a child 3 months ago but has not had a cycle since,

## 2011-09-29 ENCOUNTER — Other Ambulatory Visit (INDEPENDENT_AMBULATORY_CARE_PROVIDER_SITE_OTHER): Payer: Self-pay

## 2011-09-29 DIAGNOSIS — L0291 Cutaneous abscess, unspecified: Secondary | ICD-10-CM

## 2011-09-30 ENCOUNTER — Encounter (HOSPITAL_COMMUNITY): Payer: Self-pay | Admitting: Pharmacy Technician

## 2011-09-30 ENCOUNTER — Other Ambulatory Visit: Payer: Self-pay | Admitting: Radiology

## 2011-10-03 ENCOUNTER — Encounter (HOSPITAL_COMMUNITY): Payer: Self-pay

## 2011-10-03 ENCOUNTER — Encounter (INDEPENDENT_AMBULATORY_CARE_PROVIDER_SITE_OTHER): Payer: Self-pay | Admitting: General Surgery

## 2011-10-03 ENCOUNTER — Ambulatory Visit (HOSPITAL_COMMUNITY)
Admission: RE | Admit: 2011-10-03 | Discharge: 2011-10-03 | Disposition: A | Discharge status: Home / Self Care | Payer: Self-pay | Source: Ambulatory Visit | Attending: Surgery | Admitting: Surgery

## 2011-10-03 DIAGNOSIS — N83209 Unspecified ovarian cyst, unspecified side: Secondary | ICD-10-CM

## 2011-10-03 DIAGNOSIS — L0291 Cutaneous abscess, unspecified: Secondary | ICD-10-CM

## 2011-10-03 DIAGNOSIS — R509 Fever, unspecified: Secondary | ICD-10-CM | POA: Insufficient documentation

## 2011-10-03 DIAGNOSIS — K3532 Acute appendicitis with perforation and localized peritonitis, without abscess: Secondary | ICD-10-CM | POA: Insufficient documentation

## 2011-10-03 DIAGNOSIS — R109 Unspecified abdominal pain: Secondary | ICD-10-CM | POA: Insufficient documentation

## 2011-10-03 DIAGNOSIS — IMO0002 Reserved for concepts with insufficient information to code with codable children: Secondary | ICD-10-CM | POA: Insufficient documentation

## 2011-10-03 HISTORY — DX: Unspecified ovarian cyst, unspecified side: N83.209

## 2011-10-03 LAB — CBC
HCT: 32.1 % — ABNORMAL LOW (ref 36.0–46.0)
Hemoglobin: 10.4 g/dL — ABNORMAL LOW (ref 12.0–15.0)
RBC: 3.94 MIL/uL (ref 3.87–5.11)
RDW: 14 % (ref 11.5–15.5)
WBC: 9.5 10*3/uL (ref 4.0–10.5)

## 2011-10-03 LAB — PROTIME-INR: INR: 1.06 (ref 0.00–1.49)

## 2011-10-03 LAB — APTT: aPTT: 38 seconds — ABNORMAL HIGH (ref 24–37)

## 2011-10-03 MED ORDER — SODIUM CHLORIDE 0.9 % IV SOLN: INTRAVENOUS | Status: DC

## 2011-10-03 MED ORDER — MIDAZOLAM HCL 5 MG/5ML IJ SOLN
INTRAMUSCULAR | Status: AC | PRN
  Administered 2011-10-03 (×2): 1 mg via INTRAVENOUS

## 2011-10-03 MED ORDER — OXYCODONE-ACETAMINOPHEN 5-325 MG PO TABS
ORAL_TABLET | ORAL | Status: AC
  Administered 2011-10-03: 1 via ORAL
  Filled 2011-10-03: qty 1

## 2011-10-03 MED ORDER — MIDAZOLAM HCL 2 MG/2ML IJ SOLN
INTRAMUSCULAR | Status: AC
  Filled 2011-10-03: qty 4

## 2011-10-03 MED ORDER — PROMETHAZINE HCL 25 MG/ML IJ SOLN
12.5000 mg | Freq: Once | INTRAMUSCULAR | Status: AC
  Administered 2011-10-03: 12.5 mg via INTRAVENOUS

## 2011-10-03 MED ORDER — OXYCODONE-ACETAMINOPHEN 5-325 MG PO TABS
1.0000 | ORAL_TABLET | ORAL | Status: DC | PRN
  Administered 2011-10-03: 1 via ORAL

## 2011-10-03 MED ORDER — FENTANYL CITRATE 0.05 MG/ML IJ SOLN
INTRAMUSCULAR | Status: AC | PRN
  Administered 2011-10-03: 50 ug via INTRAVENOUS
  Administered 2011-10-03 (×2): 25 ug via INTRAVENOUS

## 2011-10-03 MED ORDER — FENTANYL CITRATE 0.05 MG/ML IJ SOLN
INTRAMUSCULAR | Status: AC
  Filled 2011-10-03: qty 4

## 2011-10-03 NOTE — Procedures (Signed)
Successful CT guided right transgluteal abscess drainage catheter placement.  Successful aspiration of 40 cc of purulent material.  No immediate complications.

## 2011-10-03 NOTE — Progress Notes (Signed)
Transferred pt to section C because [facility]sb closing. Gave report to Gwenyth Bouillon, RN and instructed her that if she did indeed go home today she would still need teaching done for her drain care.

## 2011-10-03 NOTE — Progress Notes (Signed)
Teaching patient's family how to flush drainage catheter and catheter would not flush.  Paged Beckey Downing to report findings.

## 2011-10-03 NOTE — Progress Notes (Signed)
Subjective: Pt. Has just undergone outpatient drainage  of RLQ abscess. She was scheduled to go home, but was having second thoughts about it.  Unsure she could handle the drain at home.  I spent about 30 min talking with her and she finally decided to go home. Spanish Interpreter was present and assisted with communication.  I gave her the option of staying in hospital, or going home as planned.   Family and friends will help to care for her. She has 3 more days of Augmentin 875/125, BID. She has Ibuprofen, and I gave her a new prescription for Percocet.  She and friends are receiving drain instruction now. She knows to obtain follow-up in the office in 48-72 hours. Objective: Vital signs in last 24 hours: Temp:  [97.6 F (36.4 C)-98.5 F (36.9 C)] 98.4 F (36.9 C) (12/10 1414) Pulse Rate:  [81-102] 83  (12/10 1453) Resp:  [16-20] 18  (12/10 1453) BP: (81-110)/(50-71) 93/58 mmHg (12/10 1453) SpO2:  [97 %-100 %] 99 % (12/10 1453) Weight:  [61.689 kg (136 lb)] 136 lb (61.689 kg) (12/10 0801)    Intake/Output from previous day:   Intake/Output this shift:    General appearance: alert, cooperative and no distress GI: Abdomen is still tender, pills had not relieved her discomfort at this point.  She has a drain coming from R buttocks.. Abd is soft, the incisions look good and well healed.  Lab Results:   [facility] 10/03/11 0812  WBC 9.5  HGB 10.4*  HCT 32.1*  PLT 378    BMET No results found for this basename: NA:2,K:2,CL:2,CO2:2,GLUCOSE:2,BUN:2,CREATININE:2,CALCIUM:2 in the last 72 hours PT/INR  Basename 10/03/11 0812  LABPROT 14.0  INR 1.06     Studies/Results: Ct Guided Abscess Drain  10/03/2011  *RADIOLOGY REPORT*  Indication: Enlarging pelvic abscess post appendectomy despite antibiotics; Persistent abdominal pain, fever and chills  CT GUIDED RIGHT SIDED TRANSGLTEAL DRAINAGE CATHETER PLACEMENT  Comparison: CT of the abdomen and pelvis - 09/28/2011; 09/21/2011;  08/27/2011; 08/12/2011  Medications: Fentanyl 100 mcg IV; Versed 2 mg IV  Total Moderate Sedation time: 20 minutes  CT fluoroscopy time:  9 seconds  Intravenous Contrast: 150 ml Isovue 300  Complications: None immediate  Technique / Findings:  Informed written consent was obtained from the patient (via a Spanish interpreter) after a discussion of the risks, benefits and alternatives to treatment. Initially the patient was placed supine on the CT gantry and a pre procedural CT was performed with contrast re-demonstrating the known abscess/fluid collection within the right lower pelvis.  Due to lack of adequate window, the patient was placed prone and repeat pelvic CT with contrast was performed to help delineate an appropriate window.  Due to the excretion of contrast within the urinary bladder, the patient was asked to void and a final planning scan was performed with the patient positioned prone demonstrating an adequate window to the right pelvic collection via a right transgluteal approach.  The procedure was planned.   A timeout was performed prior to the initiation of the procedure.  The medial aspect of the right buttock was prepped and draped in the usual sterile fashion.   The overlying soft tissues were anesthetized with 1% lidocaine with epinephrine.  Appropriate trajectory was planned initially with a 22 gauge spinal needle with special attention paid to avoid the pelvic sidewall vasculature. An 18 gauge trocar needle was advanced in to the abscess/fluid collection under intermittent CT fluoroscopic guidance.  A short Amplatz super stiff wire was coiled within  the abscess/fluid collection.   Appropriate positioning was confirmed with a limited CT scan.  The tract was serially dilated allowing placement of a 10 [place] all-purpose drainage catheter.  Appropriate positioning was confirmed with a limited postprocedural CT scan.  40 ml of thick purulent fluid was aspirated.  The tube was connected to a JP bulb  and sutured in place.  A dressing was placed.  The patient tolerated the procedure well without immediate post procedural complication.  Impression:  Successful CT guided placement of a 10 French all purpose drain catheter into the right pelvic abscess with aspiration of 40 mL of purulent fluid.  Samples were sent to the laboratory as requested by the ordering clinical team.  Original Report Authenticated By: Waynard Reeds, M.D.    Anti-infectives: Anti-infectives    None     Current Outpatient Prescriptions  Medication Sig Dispense Refill  . amoxicillin-clavulanate (AUGMENTIN) 875-125 MG per tablet Take 1 tablet by mouth 2 (two) times daily. For 10 days only   Unknown start date       . ibuprofen (ADVIL,MOTRIN) 200 MG tablet Take 400 mg by mouth every 6 (six) hours as needed. For pain.      Marland Kitchen oxyCODONE-acetaminophen (PERCOCET) 5-325 MG per tablet Take 1 tablet by mouth every 4 (four) hours as needed for pain.  15 tablet  0   Current Facility-Administered Medications  Medication Dose Route Frequency Provider Last Rate Last Dose  . 0.9 %  sodium chloride infusion   Intravenous Continuous Robet Leu, PA      . fentaNYL (SUBLIMAZE) 0.05 MG/ML injection           . fentaNYL (SUBLIMAZE) injection   Intravenous PRN Simonne Come   25 mcg at 10/03/11 1045  . midazolam (VERSED) 2 MG/2ML injection           . midazolam (VERSED) 5 MG/5ML injection   Intravenous PRN Simonne Come   1 mg at 10/03/11 1041  . oxyCODONE-acetaminophen (PERCOCET) 5-325 MG per tablet 1-2 tablet  1-2 tablet Oral Q4H PRN Simonne Come   1 tablet at 10/03/11 1205  . promethazine (PHENERGAN) injection 12.5 mg  12.5 mg Intravenous Once Robet Leu, PA   12.5 mg at 10/03/11 1345  . DISCONTD: 0.9 %  sodium chloride infusion   Intravenous Continuous Robet Leu, PA        Assessment/Plan Patient Active Problem List  Diagnoses  . Appendicitis with perforation  . Hemorrhagic ovarian cyst  . Pelvic abscess  Plan:  S/P  Percutaneous drainage of RLQ abscess.  Follow up in 3 days [facility]. Cultures pending.  She has a new RX for Percocet 5/325 #40, 1-2 PO q4hPRN.   LOS: 0 days    Melanie Rodriguez 10/03/2011

## 2011-10-03 NOTE — Progress Notes (Signed)
Pt's friend will be caregiver who takes care of pt's drain.  She speaks very good english and was able to verbalize understanding of drain care which included dsg changes and flushing, emptying and recording amt of drainage.  Pt given all supplies to last for 1 week.  Pt has follow-up appointment in office on Wed. 10-05-11.

## 2011-10-03 NOTE — H&P (Signed)
Melanie Rodriguez is an 25 y.o. female.   Chief Complaint: RLQ pain post appendectomy 08/13/11; CT shows small abscess HPI: scheduled for Aspiration Vs Drain placement today in IR   History reviewed. No pertinent past medical history.  Past Surgical History  Procedure Date  . Appendectomy     History reviewed. No pertinent family history. Social History:  reports that she has never smoked. She does not have any smokeless tobacco history on file. She reports that she does not drink alcohol or use illicit drugs.  Allergies: No Known Allergies  Medications Prior to Admission  Medication Sig Dispense Refill  . ibuprofen (ADVIL,MOTRIN) 200 MG tablet Take 400 mg by mouth every 6 (six) hours as needed. For pain.      Marland Kitchen oxyCODONE-acetaminophen (PERCOCET) 5-325 MG per tablet Take 1 tablet by mouth every 4 (four) hours as needed for pain.  15 tablet  0   Medications Prior to Admission  Medication Dose Route Frequency Provider Last Rate Last Dose  . 0.9 %  sodium chloride infusion   Intravenous Continuous Robet Leu, PA        No results found for this or any previous visit (from the past 48 hour(s)). No results found.  Review of Systems  Constitutional: Negative for fever and chills.  Respiratory: Negative for cough.   Cardiovascular: Negative for chest pain.  Gastrointestinal: Positive for abdominal pain. Negative for nausea, vomiting and diarrhea.  Neurological: Negative for headaches.    Blood pressure 101/63, pulse 85, temperature 97.6 F (36.4 C), temperature source Oral, resp. rate 20, height 4\' 11"  (1.499 m), weight 136 lb (61.689 kg), last menstrual period 09/05/2010, SpO2 100.00%. Physical Exam  Constitutional: She is oriented to person, place, and time. She appears well-developed and well-nourished.  HENT:  Head: Normocephalic.  Eyes: EOM are normal.  Neck: Normal range of motion.  Cardiovascular: Normal rate, regular rhythm and normal heart sounds.   No murmur  heard. Respiratory: Effort normal and breath sounds normal. She has no wheezes.  GI: Bowel sounds are normal. She exhibits no mass. There is tenderness.       RLQ tender  Musculoskeletal: Normal range of motion.  Neurological: She is alert and oriented to person, place, and time.  Skin: Skin is warm and dry.     Assessment/Plan Post appendectomy abscess; possible aspiration vs drain placement today in IR Pt aware of procedure benefits and risks and agreeable to proceed. Interpreter has been with pt throughout discussion and all questions have been answered to satisfaction. Consent signed.  Anant Agard A 10/03/2011, 8:22 AM

## 2011-10-03 NOTE — Progress Notes (Signed)
Melanie Downing, PA sent two techs from CT to check on the JP drain.  Chris,from CT came pulled back with a 20cc syringe and was then able to flush the catheter with 10cc NS.  The patient then stated that she does not want to go home and states she does not have the help she needs at home to care for this drain.  Paged Melanie Rodriguez and reported the pt does not want to go home now.  She is going to call the PA for surgery and see what they say.  Updated pt and awaiting return call.

## 2011-10-03 NOTE — ED Notes (Signed)
40mL of thick tan/green purulent drainage removed during procedure

## 2011-10-04 NOTE — Progress Notes (Signed)
Agree with above 

## 2011-10-05 ENCOUNTER — Encounter (INDEPENDENT_AMBULATORY_CARE_PROVIDER_SITE_OTHER): Payer: Self-pay | Admitting: General Surgery

## 2011-10-05 ENCOUNTER — Ambulatory Visit (INDEPENDENT_AMBULATORY_CARE_PROVIDER_SITE_OTHER): Payer: Self-pay | Admitting: General Surgery

## 2011-10-05 VITALS — BP 126/82 | HR 60 | Temp 98.6°F | Resp 18 | Ht 63.0 in | Wt 135.4 lb

## 2011-10-05 DIAGNOSIS — IMO0002 Reserved for concepts with insufficient information to code with codable children: Secondary | ICD-10-CM

## 2011-10-05 DIAGNOSIS — K651 Peritoneal abscess: Secondary | ICD-10-CM

## 2011-10-05 NOTE — Progress Notes (Signed)
[name] is here for a followup visit after percutaneous drainage of a right pelvic abscess the right gluteal approach 2 days ago. She underwent a laparoscopic appendectomy 2 months ago for perforated appendicitis. She was found to have a pelvic abscess and a recent CT scan ordered by Dr. Daphine Deutscher. The percutaneous drainage was then arranged. She is taking Augmentin.  She is still having some right lower quadrant pain but no fevers.  Exam: Abdomen-soft, well-healed small scars, right lower quadrant tenderness is present.  Right buttock-drainage protruding with the bandage around it and purulent drainage in the bulb  Assessment: Pelvic abscess following laparoscopic appendectomy for her for an appendicitis 2 months ago. I explained to her that something like this usually occurs much earlier in the postoperative course.  Plan: Continue antibiotics. Return visit in one week and schedule repeat CT at that time.

## 2011-10-05 NOTE — Patient Instructions (Signed)
Continue current drain care.  Take your pain medicine if you need it.

## 2011-10-06 LAB — CULTURE, ROUTINE-ABSCESS: Culture: NO GROWTH

## 2011-10-10 LAB — ANAEROBIC CULTURE

## 2011-10-11 ENCOUNTER — Other Ambulatory Visit (INDEPENDENT_AMBULATORY_CARE_PROVIDER_SITE_OTHER): Payer: Self-pay | Admitting: General Surgery

## 2011-10-11 ENCOUNTER — Telehealth (INDEPENDENT_AMBULATORY_CARE_PROVIDER_SITE_OTHER): Payer: Self-pay | Admitting: General Surgery

## 2011-10-11 ENCOUNTER — Ambulatory Visit (INDEPENDENT_AMBULATORY_CARE_PROVIDER_SITE_OTHER): Payer: Self-pay | Admitting: General Surgery

## 2011-10-11 VITALS — BP 114/64 | HR 45 | Temp 97.7°F | Resp 14 | Wt 133.0 lb

## 2011-10-11 DIAGNOSIS — R1900 Intra-abdominal and pelvic swelling, mass and lump, unspecified site: Secondary | ICD-10-CM

## 2011-10-11 DIAGNOSIS — IMO0002 Reserved for concepts with insufficient information to code with codable children: Secondary | ICD-10-CM

## 2011-10-11 DIAGNOSIS — R222 Localized swelling, mass and lump, trunk: Secondary | ICD-10-CM

## 2011-10-11 DIAGNOSIS — K651 Peritoneal abscess: Secondary | ICD-10-CM

## 2011-10-11 NOTE — Patient Instructions (Signed)
Continue current drain care. 

## 2011-10-11 NOTE — Progress Notes (Signed)
[name] is here for a followup visit after percutaneous drainage of a right pelvic abscess via a right gluteal approach 10/03/11 days ago. She underwent a laparoscopic appendectomy 2 months ago for perforated appendicitis and was found to have a pelvic abscess on a recent CT scan.  She is feeling much better.  She has no pain.  She denies fevers.  She has completed her antibiotics.  Exam: Abdomen-soft, well-healed small scars, right lower quadrant without tenderness.  Right buttock-drainage catheter present with the bandage around it and clear drainage in the bulb  Assessment: Pelvic abscess following laparoscopic appendectomy for her for an appendicitis 2 months ago s/p drain placement- drainage is clear and she is significantly better clinically.  Plan: Arrange for repeat CT scan and if the abscess has cleared injection through the drain to r/o a fistula.  If both of these look okay, will have IR remove the catheter and have her return to the office in 2-3 weeks.

## 2011-10-11 NOTE — Progress Notes (Signed)
Addended by: Milas Hock on: 10/11/2011 09:12 AM   Modules accepted: Orders

## 2011-10-14 ENCOUNTER — Ambulatory Visit (HOSPITAL_COMMUNITY)
Admission: RE | Admit: 2011-10-14 | Discharge: 2011-10-14 | Disposition: A | Discharge status: Home / Self Care | Payer: Self-pay | Source: Ambulatory Visit | Attending: General Surgery | Admitting: General Surgery

## 2011-10-14 DIAGNOSIS — Z09 Encounter for follow-up examination after completed treatment for conditions other than malignant neoplasm: Secondary | ICD-10-CM | POA: Insufficient documentation

## 2011-10-14 DIAGNOSIS — R1900 Intra-abdominal and pelvic swelling, mass and lump, unspecified site: Secondary | ICD-10-CM

## 2011-10-14 DIAGNOSIS — N731 Chronic parametritis and pelvic cellulitis: Secondary | ICD-10-CM | POA: Insufficient documentation

## 2011-10-14 MED ORDER — IOHEXOL 300 MG/ML  SOLN
100.0000 mL | Freq: Once | INTRAMUSCULAR | Status: AC | PRN
  Administered 2011-10-14: 100 mL via INTRAVENOUS

## 2011-10-17 ENCOUNTER — Telehealth (INDEPENDENT_AMBULATORY_CARE_PROVIDER_SITE_OTHER): Payer: Self-pay | Admitting: General Surgery

## 2011-10-17 ENCOUNTER — Telehealth (INDEPENDENT_AMBULATORY_CARE_PROVIDER_SITE_OTHER): Payer: Self-pay

## 2011-10-17 ENCOUNTER — Other Ambulatory Visit (INDEPENDENT_AMBULATORY_CARE_PROVIDER_SITE_OTHER): Payer: Self-pay | Admitting: General Surgery

## 2011-10-17 DIAGNOSIS — IMO0002 Reserved for concepts with insufficient information to code with codable children: Secondary | ICD-10-CM

## 2011-10-17 MED ORDER — AMOXICILLIN-POT CLAVULANATE 875-125 MG PO TABS: 1.0000 | ORAL_TABLET | Freq: Two times a day (BID) | ORAL | Status: DC

## 2011-10-17 NOTE — Telephone Encounter (Signed)
Have tried multiple times to call her and discuss the CT scan results with her and treatment plan.  However, her phone is not taking calls or she is unavailable.  There is no way to leave a message for her.  Will have office staff continue to try and reach her as I will be going out of town for an extended period of time on 10/19/11.

## 2011-10-17 NOTE — Telephone Encounter (Signed)
CT of abdomen pelvis with contrast scheduled at 12:45 on 10/20/2011 at [facility].  Follow up here in urgent office with Dr.Blackman 10/21/2011. Per Dr. Abbey Chatters.  [name] the patient's sister is aware of all appointments.  [name] was advised to call [phone] to confirm appointment and pick up contrast at hospital today.

## 2011-10-17 NOTE — Telephone Encounter (Signed)
[name], her cousin, called back.  I explained the results of the CT scan to her so she could explain it to Ms. Botello.  I told her the abscess was smaller but she had developed some right hydronephrosis secondary to a partial distal ureteral obstruction which appeared to be secondary to the inflammatory change in the right pelvis.  I also told her that this may be due to an infection of the right ovary rather than a postoperative infection.  I told her we wanted to keep the drain in, restart her antibiotics and repeat the CT scan on 10/20/11.  If the hydronephrosis is still present, she will need to see a Urologist for a possible stent placement.  I asked [name] to tell Ms. Botello that her telephone was not accepting calls and so we have not been able to get a hold of her.  I told [name] that I would be going out of town, but one of my partners would follow up with her.  I told [name] to call the office to arrange follow up if no one called her.  She seemed to understand all of this.

## 2011-10-19 ENCOUNTER — Telehealth (INDEPENDENT_AMBULATORY_CARE_PROVIDER_SITE_OTHER): Payer: Self-pay

## 2011-10-19 NOTE — Telephone Encounter (Signed)
I called Melanie Rodriguez the patients sister to check to see if Latanga Nedrow has had the CT scan as of yet. She stated that the information was given to the patient on Monday.  I encouraged her to call her sister for follow up of the scan and the prep she was to pick up.  She will call Bernie Dr. Maris Berger nurse  with an update.

## 2011-10-19 NOTE — Telephone Encounter (Signed)
Pt family states she was called by someone re; her scan.  Pts family called stating they have confirmed that her scan is tomorrow.

## 2011-10-20 ENCOUNTER — Ambulatory Visit (HOSPITAL_COMMUNITY)
Admission: RE | Admit: 2011-10-20 | Discharge: 2011-10-20 | Disposition: A | Discharge status: Home / Self Care | Payer: Self-pay | Source: Ambulatory Visit | Attending: General Surgery | Admitting: General Surgery

## 2011-10-20 DIAGNOSIS — IMO0002 Reserved for concepts with insufficient information to code with codable children: Secondary | ICD-10-CM

## 2011-10-20 DIAGNOSIS — N731 Chronic parametritis and pelvic cellulitis: Secondary | ICD-10-CM | POA: Insufficient documentation

## 2011-10-20 DIAGNOSIS — N1330 Unspecified hydronephrosis: Secondary | ICD-10-CM | POA: Insufficient documentation

## 2011-10-20 DIAGNOSIS — Z9089 Acquired absence of other organs: Secondary | ICD-10-CM | POA: Insufficient documentation

## 2011-10-20 DIAGNOSIS — N83209 Unspecified ovarian cyst, unspecified side: Secondary | ICD-10-CM | POA: Insufficient documentation

## 2011-10-20 MED ORDER — IOHEXOL 300 MG/ML  SOLN: 50.0000 mL | Freq: Once | INTRAMUSCULAR | Status: AC | PRN

## 2011-10-20 MED ORDER — IOHEXOL 300 MG/ML  SOLN
100.0000 mL | Freq: Once | INTRAMUSCULAR | Status: AC | PRN
  Administered 2011-10-20: 100 mL via INTRAVENOUS

## 2011-10-20 NOTE — Procedures (Signed)
Injection of pelvic drain.  Small residual collection.  No fistula connection to bowel.  Drain was removed.

## 2011-10-21 ENCOUNTER — Ambulatory Visit (INDEPENDENT_AMBULATORY_CARE_PROVIDER_SITE_OTHER): Payer: Self-pay | Admitting: Surgery

## 2011-10-21 ENCOUNTER — Other Ambulatory Visit: Payer: Self-pay | Admitting: Urology

## 2011-10-21 ENCOUNTER — Encounter (HOSPITAL_BASED_OUTPATIENT_CLINIC_OR_DEPARTMENT_OTHER): Payer: Self-pay | Admitting: *Deleted

## 2011-10-21 ENCOUNTER — Encounter (INDEPENDENT_AMBULATORY_CARE_PROVIDER_SITE_OTHER): Payer: Self-pay | Admitting: Surgery

## 2011-10-21 VITALS — BP 118/84 | HR 68 | Temp 97.9°F | Resp 16 | Ht 62.75 in | Wt 133.8 lb

## 2011-10-21 DIAGNOSIS — Z09 Encounter for follow-up examination after completed treatment for conditions other than malignant neoplasm: Secondary | ICD-10-CM

## 2011-10-21 NOTE — Progress Notes (Signed)
To [facility] at 1030,Hg,Urine pregnancy on arrival.npo after mn .Interpreter arranged to be here 1030-1430.

## 2011-10-21 NOTE — Progress Notes (Signed)
Subjective:     Patient ID: Melanie Rodriguez, female   DOB: Apr 08, 1986, 25 y.o.   MRN: [id number]  HPI  She is here today as a followup for Dr. Abbey Chatters. Her drain was actually removed yesterday by interventional radiology. She has a schedule surgery with the urologist on Monday the 31st for stent placement. She is doing well and has no complaints. She is eating well and denies fevers. Review of Systems     Objective:   Physical Exam    On exam, she is well appearance. Her abdomen is soft and nontender. Her drain removal site is clear Assessment:     Patient status post removal of pelvic drain. The CAT scan had shown that the collection had decreased. She still has hydronephrosis    Plan:     She will have a stent placed on Monday. She will keep her appointment with Dr. Abbey Chatters on 9 January. She will call should she have any fevers. If this does happen we will have to repeat her CAT scan

## 2011-10-24 ENCOUNTER — Encounter (HOSPITAL_BASED_OUTPATIENT_CLINIC_OR_DEPARTMENT_OTHER): Payer: Self-pay | Admitting: Anesthesiology

## 2011-10-24 ENCOUNTER — Ambulatory Visit (HOSPITAL_BASED_OUTPATIENT_CLINIC_OR_DEPARTMENT_OTHER)
Admission: RE | Admit: 2011-10-24 | Discharge: 2011-10-24 | Disposition: A | Discharge status: Home / Self Care | Payer: Self-pay | Source: Ambulatory Visit | Attending: Urology | Admitting: Urology

## 2011-10-24 ENCOUNTER — Encounter (HOSPITAL_BASED_OUTPATIENT_CLINIC_OR_DEPARTMENT_OTHER)
Admission: RE | Disposition: A | Discharge status: Home / Self Care | Payer: Self-pay | Source: Ambulatory Visit | Attending: Urology

## 2011-10-24 ENCOUNTER — Encounter (HOSPITAL_BASED_OUTPATIENT_CLINIC_OR_DEPARTMENT_OTHER): Payer: Self-pay | Admitting: *Deleted

## 2011-10-24 ENCOUNTER — Ambulatory Visit (HOSPITAL_BASED_OUTPATIENT_CLINIC_OR_DEPARTMENT_OTHER): Payer: Self-pay | Admitting: Anesthesiology

## 2011-10-24 DIAGNOSIS — N1330 Unspecified hydronephrosis: Secondary | ICD-10-CM | POA: Insufficient documentation

## 2011-10-24 DIAGNOSIS — Z01812 Encounter for preprocedural laboratory examination: Secondary | ICD-10-CM | POA: Insufficient documentation

## 2011-10-24 HISTORY — PX: CYSTOSCOPY: SHX5120

## 2011-10-24 LAB — POCT HEMOGLOBIN-HEMACUE: Hemoglobin: 11.7 g/dL — ABNORMAL LOW (ref 12.0–15.0)

## 2011-10-24 SURGERY — CYSTOSCOPY
Anesthesia: General | Site: Ureter | Laterality: Right | Wound class: Clean Contaminated

## 2011-10-24 MED ORDER — FENTANYL CITRATE 0.05 MG/ML IJ SOLN: 25.0000 ug | INTRAMUSCULAR | Status: DC | PRN

## 2011-10-24 MED ORDER — HYDROCODONE-ACETAMINOPHEN 5-325 MG PO TABS
1.0000 | ORAL_TABLET | Freq: Four times a day (QID) | ORAL | Status: DC | PRN
  Administered 2011-10-24: 1 via ORAL

## 2011-10-24 MED ORDER — DEXAMETHASONE SODIUM PHOSPHATE 4 MG/ML IJ SOLN
INTRAMUSCULAR | Status: DC | PRN
  Administered 2011-10-24: 10 mg via INTRAVENOUS

## 2011-10-24 MED ORDER — STERILE WATER FOR IRRIGATION IR SOLN
Status: DC | PRN
  Administered 2011-10-24: 3000 mL

## 2011-10-24 MED ORDER — CEFAZOLIN SODIUM 1-5 GM-% IV SOLN
INTRAVENOUS | Status: DC | PRN
  Administered 2011-10-24: 1 g via INTRAVENOUS

## 2011-10-24 MED ORDER — MIDAZOLAM HCL 5 MG/5ML IJ SOLN
INTRAMUSCULAR | Status: DC | PRN
  Administered 2011-10-24: 2 mg via INTRAVENOUS

## 2011-10-24 MED ORDER — ONDANSETRON HCL 4 MG/2ML IJ SOLN
INTRAMUSCULAR | Status: DC | PRN
  Administered 2011-10-24: 4 mg via INTRAVENOUS

## 2011-10-24 MED ORDER — LIDOCAINE HCL (CARDIAC) 20 MG/ML IV SOLN
INTRAVENOUS | Status: DC | PRN
  Administered 2011-10-24: 40 mg via INTRAVENOUS

## 2011-10-24 MED ORDER — LACTATED RINGERS IV SOLN
INTRAVENOUS | Status: DC
  Administered 2011-10-24: 12:00:00 via INTRAVENOUS

## 2011-10-24 MED ORDER — PROPOFOL 10 MG/ML IV EMUL
INTRAVENOUS | Status: DC | PRN
  Administered 2011-10-24: 140 mg via INTRAVENOUS

## 2011-10-24 MED ORDER — PROMETHAZINE HCL 25 MG/ML IJ SOLN: 6.2500 mg | INTRAMUSCULAR | Status: DC | PRN

## 2011-10-24 MED ORDER — FENTANYL CITRATE 0.05 MG/ML IJ SOLN
INTRAMUSCULAR | Status: DC | PRN
  Administered 2011-10-24: 50 ug via INTRAVENOUS

## 2011-10-24 SURGICAL SUPPLY — 15 items
BAG DRAIN URO-CYSTO SKYTR STRL (DRAIN) ×2 IMPLANT
CANISTER SUCT LVC 12 LTR MEDI- (MISCELLANEOUS) IMPLANT
CLOTH BEACON ORANGE TIMEOUT ST (SAFETY) ×2 IMPLANT
DRAPE CAMERA CLOSED 9X96 (DRAPES) IMPLANT
ELECT REM PT RETURN 9FT ADLT (ELECTROSURGICAL) ×2
ELECTRODE REM PT RTRN 9FT ADLT (ELECTROSURGICAL) ×1 IMPLANT
GLOVE BIO SURGEON STRL SZ7 (GLOVE) ×2 IMPLANT
GLOVE INDICATOR 6.5 STRL GRN (GLOVE) ×4 IMPLANT
NDL SAFETY ECLIPSE 18X1.5 (NEEDLE) IMPLANT
NEEDLE HYPO 18GX1.5 SHARP (NEEDLE)
NEEDLE HYPO 22GX1.5 SAFETY (NEEDLE) IMPLANT
NS IRRIG 500ML POUR BTL (IV SOLUTION) IMPLANT
PACK CYSTOSCOPY (CUSTOM PROCEDURE TRAY) ×2 IMPLANT
SYR 20CC LL (SYRINGE) IMPLANT
WATER STERILE IRR 3000ML UROMA (IV SOLUTION) ×2 IMPLANT

## 2011-10-24 NOTE — Transfer of Care (Signed)
Immediate Anesthesia Transfer of Care Note  Patient: Melanie Rodriguez  Procedure(s) Performed:  CYSTOSCOPY - cysto and stent placement  c arm  Patient Location: PACU  Anesthesia Type: General  Level of Consciousness: sedated and responds to stimulation  Airway & Oxygen Therapy: Patient Spontanous Breathing and Patient connected to nasal cannula oxygen  Post-op Assessment: Report given to PACU RN  Post vital signs: Reviewed and stable  Complications: No apparent anesthesia complications

## 2011-10-24 NOTE — H&P (Signed)
Chief Complaint  Seen today for a pre-op PE.   Active Problems Problems  1. Hydronephrosis 591 2. Visit For: Preoperative Urological Exam V72.83  History of Present Illness                 25 YO female accompained by interpertor is seen today for a pre-op exam. Has right hydronephrosis and is scheduled for cystoureterscopy, R RPG, and R double J stent placement 10/24/11 by Dr. Brunilda Payor.   Developed PO pelvic abscess from perforated appendicitis 08/13/11. Now has developed right moderate hydronephrosis presumably due to mass effect and/or effusion. Has right-sided transgluteal approach percutanous drainage cathether in place.   Interval HX: Denies hematuria or dysuria. No flank pain.   Surgical History Problems  1. History of  Appendectomy 2. History of  Appendectomy  Allergies Medication  1. No Known Drug Allergies  Family History Problems  1. 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  Gastrointestinal: nausea and diarrhea.  Constitutional: fever, feeling tired (fatigue) and recent weight loss.  Eyes: blurred vision.  Psychiatric: depression.    Vitals Vital Signs [Data Includes: Last 1 Day]  28Dec2012 01:32PM  BMI Calculated: 24.79 BSA Calculated: 1.6 Height: 5 ft 1.5 in Weight: 133 lb  28Dec2012 01:27PM  Blood Pressure: 134 / 78 Temperature: 97.9 F Heart Rate: 80  Physical Exam Constitutional: Well nourished and well developed . No acute distress. The patient appears well hydrated.  ENT:. The ears and nose are normal in appearance.  Neck: The appearance  of the neck is normal.  Pulmonary: No respiratory distress.  Cardiovascular: Heart rate and rhythm are normal.  Abdomen: The abdomen is flat. The abdomen is soft and nontender. No suprapubic tenderness. No CVA tenderness.  Genitourinary:. Deferred.  Skin: Normal skin turgor and normal skin color and pigmentation.  Neuro/Psych:. Mood and affect are appropriate.    Results/Data Urine [Data Includes: Last 1 Day]   28Dec2012  COLOR YELLOW   APPEARANCE CLEAR   SPECIFIC GRAVITY 1.015   pH 6.0   GLUCOSE NEG mg/dL  BILIRUBIN NEG   KETONE NEG mg/dL  BLOOD TRACE   PROTEIN NEG mg/dL  UROBILINOGEN 0.2 mg/dL  NITRITE NEG   LEUKOCYTE ESTERASE NEG   SQUAMOUS EPITHELIAL/HPF RARE   WBC 0-3 WBC/hpf  RBC 0-3 RBC/hpf  BACTERIA RARE   CRYSTALS NONE SEEN   CASTS NONE SEEN   Other MUCUS    Old records or history reviewed: Reviewed ER notes.  The following clinical lab reports were reviewed:  UA.  The following radiology reports were reviewed: Personally reviewed CT abd/pelvis w/contrast 10/13/11 and 09/28/11.    Assessment Assessed  1. Hydronephrosis 591 2. Visit For: Preoperative Urological Exam V72.83  Plan Hydronephrosis (591)  1. RENAL U/S RIGHT      Proceed with cystoureterscopy, R RPG, and placement of R double J stent 10/24/11/ by Dr. Brunilda Payor. Risks and benefits discussed with interpertor Sharin Mons, CMW) which include: bleeding, infection, ureteral injury. All questions addressed and answered to patient's satisfication. Wishes to proceed.   Signatures Electronically signed by : Jetta Lout, Dyann Ruddle; Oct 21 2011  1:58PM   Melanie Rodriguez 10/24/2011, 11:21 AM

## 2011-10-24 NOTE — Anesthesia Preprocedure Evaluation (Signed)
Anesthesia Evaluation  Patient identified by MRN, date of birth, ID band Patient awake    Reviewed: Allergy & Precautions, H&P , NPO status , Patient's Chart, lab work & pertinent test results  Airway Mallampati: II TM Distance: >3 FB Neck ROM: Full    Dental No notable dental hx.    Pulmonary neg pulmonary ROS,  clear to auscultation  Pulmonary exam normal       Cardiovascular neg cardio ROS Regular Normal    Neuro/Psych Negative Neurological ROS  Negative Psych ROS   GI/Hepatic negative GI ROS, Neg liver ROS,   Endo/Other  Negative Endocrine ROS  Renal/GU negative Renal ROS  Genitourinary negative   Musculoskeletal negative musculoskeletal ROS (+)   Abdominal   Peds negative pediatric ROS (+)  Hematology negative hematology ROS (+)   Anesthesia Other Findings   Reproductive/Obstetrics negative OB ROS                           Anesthesia Physical Anesthesia Plan  ASA: I  Anesthesia Plan: General   Post-op Pain Management:    Induction: Intravenous  Airway Management Planned: LMA  Additional Equipment:   Intra-op Plan:   Post-operative Plan: Extubation in OR  Informed Consent: I have reviewed the patients History and Physical, chart, labs and discussed the procedure including the risks, benefits and alternatives for the proposed anesthesia with the patient or authorized representative who has indicated his/her understanding and acceptance.   Dental advisory given  Plan Discussed with: CRNA  Anesthesia Plan Comments:         Anesthesia Quick Evaluation  

## 2011-10-24 NOTE — Anesthesia Procedure Notes (Signed)
Procedure Name: LMA Insertion Date/Time: 10/24/2011 12:10 PM Performed by: Maris Berger Pre-anesthesia Checklist: Patient identified, Emergency Drugs available, Suction available and Patient being monitored Patient Re-evaluated:Patient Re-evaluated prior to inductionOxygen Delivery Method: Circle System Utilized Preoxygenation: Pre-oxygenation with 100% oxygen Intubation Type: IV induction Ventilation: Mask ventilation without difficulty LMA: LMA inserted LMA Size: 4.0 Number of attempts: 1 Placement Confirmation: positive ETCO2 Dental Injury: Teeth and Oropharynx as per pre-operative assessment

## 2011-10-24 NOTE — Op Note (Signed)
Melanie Rodriguez is a 25 y.o.   10/24/2011  General  Surgeon: Wendie Simmer. Melanie Rodriguez  Pre-op Diagnosis: Right hydronephrosis  Post-op Diagnosis: Same  Procedure done: Cystoscopy, right retrograde pyelogram and insertion of double-J stent.  Indication: Patient is a 25 years old female who had a percutaneous drainage of a right pelvic sidewall abscess after appendectomy. CT scan showed moderate right hydronephrosis secondary to extrinsic pressure. She is scheduled for cystoscopy and retrograde pyelogram and insertion of double-J stent.  Procedure: The patient was identified by her wrist band and proper timeout was taken.  Under general anesthesia she was prepped and draped and placed in the dorsolithotomy position. A panendoscope was inserted in the bladder. The bladder mucosa is normal. There is no stone or tumor in the bladder. The ureteral orifices are in normal position and shape.  Retrograde pyelogram:  A cone tip catheter was passed trough the cystoscope and the right ureteral orifice. Contrast was then injected through the cone-tip catheter. The distal ureter is normal. The  mid and proximal ureter is dilated. There is a U. kink at the proximal ureter just distal to the UPJ. The renal pelvis and calyces are moderately dilated. The cone-tip catheter was then removed.  A sensor wire was passed through the cystoscope and through the right ureteral orifice. The sensor wire could not navigate the kink at the proximal ureter. I removed the sensor wire and passed a glide wire through an open-ended catheter. The Glidewire was then advanced and was able to pass the Glidewire in the renal pelvis. The open ended catheter was then advanced over the Glidewire. The Glidewire was then removed and replaced with a sensor wire. Then a #6-24 double-J stent was passed over the sensor wire. The proximal curl of the double-J stent is in the renal pelvis; the distal curl is in the bladder. The sensor wire was then removed.  The bladder was emptied and the cystoscope removed.  The patient tolerated the procedure well and left the OR in satisfactory condition to postanesthesia care unit.  EBL: No blood loss

## 2011-10-24 NOTE — Anesthesia Postprocedure Evaluation (Signed)
  Anesthesia Post-op Note  Patient: Melanie Rodriguez  Procedure(s) Performed:  CYSTOSCOPY - cysto and stent placement  c arm  Patient Location: PACU  Anesthesia Type: General  Level of Consciousness: awake and alert   Airway and Oxygen Therapy: Patient Spontanous Breathing  Post-op Pain: mild  Post-op Assessment: Post-op Vital signs reviewed, Patient's Cardiovascular Status Stable, Respiratory Function Stable, Patent Airway and No signs of Nausea or vomiting  Post-op Vital Signs: stable  Complications: No apparent anesthesia complications

## 2011-10-26 ENCOUNTER — Encounter (HOSPITAL_BASED_OUTPATIENT_CLINIC_OR_DEPARTMENT_OTHER): Payer: Self-pay | Admitting: Urology

## 2011-11-02 ENCOUNTER — Ambulatory Visit (INDEPENDENT_AMBULATORY_CARE_PROVIDER_SITE_OTHER): Payer: Self-pay | Admitting: General Surgery

## 2011-11-02 ENCOUNTER — Encounter (INDEPENDENT_AMBULATORY_CARE_PROVIDER_SITE_OTHER): Payer: Self-pay | Admitting: General Surgery

## 2011-11-02 VITALS — BP 126/90 | HR 69 | Temp 98.7°F | Wt 137.6 lb

## 2011-11-02 DIAGNOSIS — Z09 Encounter for follow-up examination after completed treatment for conditions other than malignant neoplasm: Secondary | ICD-10-CM

## 2011-11-02 NOTE — Progress Notes (Signed)
She is here for followup of her right pelvic abscess. She has had her percutaneous catheter removed by interventional radiology. She had a right ureteral stent placed by Dr. Brunilda Payor recently and is due to follow up with him in one to 2 months. She denies any right lower quadrant pain her fever.  Exam general-she looks well and in no acute distress.  Abdomen-well-healed small scars, no tenderness or mass.  Assessment: Right pelvic abscess with right hydroureter-etiology is unclear whether it is a right tubo-ovarian abscess versus a delayed postoperative infection. However it has improved.  Plan: Return p.r.n.

## 2011-11-02 NOTE — Patient Instructions (Signed)
Llamenos si tiene dolor intenso o fiebre alta.

## 2012-01-30 ENCOUNTER — Other Ambulatory Visit (HOSPITAL_COMMUNITY): Payer: Self-pay | Admitting: Urology

## 2012-01-30 DIAGNOSIS — N739 Female pelvic inflammatory disease, unspecified: Secondary | ICD-10-CM

## 2012-02-06 ENCOUNTER — Ambulatory Visit (HOSPITAL_COMMUNITY)
Admission: RE | Admit: 2012-02-06 | Discharge: 2012-02-06 | Disposition: A | Discharge status: Home / Self Care | Payer: Medicaid Other | Source: Ambulatory Visit | Attending: Urology | Admitting: Urology

## 2012-02-06 DIAGNOSIS — N9489 Other specified conditions associated with female genital organs and menstrual cycle: Secondary | ICD-10-CM | POA: Insufficient documentation

## 2012-02-06 DIAGNOSIS — K7689 Other specified diseases of liver: Secondary | ICD-10-CM | POA: Insufficient documentation

## 2012-02-06 DIAGNOSIS — Z9089 Acquired absence of other organs: Secondary | ICD-10-CM | POA: Insufficient documentation

## 2012-02-06 DIAGNOSIS — R109 Unspecified abdominal pain: Secondary | ICD-10-CM | POA: Insufficient documentation

## 2012-02-06 DIAGNOSIS — N739 Female pelvic inflammatory disease, unspecified: Secondary | ICD-10-CM

## 2012-02-06 MED ORDER — IOHEXOL 300 MG/ML  SOLN
100.0000 mL | Freq: Once | INTRAMUSCULAR | Status: AC | PRN
  Administered 2012-02-06: 100 mL via INTRAVENOUS

## 2012-03-29 DIAGNOSIS — J3489 Other specified disorders of nose and nasal sinuses: Secondary | ICD-10-CM | POA: Insufficient documentation

## 2012-03-29 DIAGNOSIS — R599 Enlarged lymph nodes, unspecified: Secondary | ICD-10-CM | POA: Insufficient documentation

## 2012-03-30 ENCOUNTER — Encounter (HOSPITAL_COMMUNITY): Payer: Self-pay | Admitting: *Deleted

## 2012-03-30 ENCOUNTER — Emergency Department (HOSPITAL_COMMUNITY)
Admission: EM | Admit: 2012-03-30 | Discharge: 2012-03-30 | Disposition: A | Discharge status: Home / Self Care | Payer: Self-pay | Attending: Emergency Medicine | Admitting: Emergency Medicine

## 2012-03-30 DIAGNOSIS — B349 Viral infection, unspecified: Secondary | ICD-10-CM

## 2012-03-30 DIAGNOSIS — R590 Localized enlarged lymph nodes: Secondary | ICD-10-CM

## 2012-03-30 NOTE — ED Notes (Signed)
Pt c/o swollen lymph node behind right ear since Wed; painful to touch

## 2012-03-30 NOTE — ED Provider Notes (Signed)
History     CSN: [id number]  Arrival date & time 03/29/12  2358   First MD Initiated Contact with Patient 03/30/12 [phone]      Chief Complaint  Patient presents with  . Lymphadenopathy    (Consider location/radiation/quality/duration/timing/severity/associated sxs/prior treatment) HPI Comments: Patient reports that she has had "bumps" posterior to her right ear for the past 2-3 days.  She reports that the areas are tender to palpation.  The "bumps" are unchanged from onset.  She has never had anything like this before.  No erythema.  She denies any fever or chills.  Denies any ear pain.  She reports that she has had some nasal congestion.  No other symptoms.  No night sweats or changes in weight.    The history is provided by the patient. A language interpreter was used Furniture conservator/restorer).    Past Medical History  Diagnosis Date  . Pelvic abscess     Past Surgical History  Procedure Date  . Appendectomy 10/12    s/p abscess w/drain -drain d/c 12/27  . Cystoscopy 10/24/2011    Procedure: CYSTOSCOPY;  Surgeon: Lindaann Slough, MD;  Location: [facility];  Service: Urology;  Laterality: Right;  cysto and stent placement  c arm    No family history on file.  History  Substance Use Topics  . Smoking status: Never Smoker   . Smokeless tobacco: Never Used  . Alcohol Use: No    OB History    Grav Para Term Preterm Abortions TAB SAB Ect Mult Living                  Review of Systems  Constitutional: Negative for fever, chills, fatigue and unexpected weight change.  HENT: Positive for congestion and rhinorrhea. Negative for hearing loss, ear pain, sore throat, neck pain, neck stiffness, sinus pressure and tinnitus.   Respiratory: Negative for shortness of breath.   Gastrointestinal: Negative for nausea and vomiting.  Skin: Negative for color change, rash and wound.  Neurological: Negative for dizziness, syncope, light-headedness and headaches.    Allergies   Review of patient's allergies indicates no known allergies.  Home Medications  No current outpatient prescriptions on file.  BP 120/70  Pulse 60  Temp(Src) 98.4 F (36.9 C) (Oral)  SpO2 100%  LMP 03/07/2012  Physical Exam  Nursing note and vitals reviewed. Constitutional: She appears well-developed and well-nourished.  HENT:  Head: Normocephalic and atraumatic.  Right Ear: Hearing, tympanic membrane, external ear and ear canal normal.  Left Ear: Hearing, tympanic membrane, external ear and ear canal normal.  Nose: Nose normal. Right sinus exhibits no maxillary sinus tenderness and no frontal sinus tenderness. Left sinus exhibits no maxillary sinus tenderness and no frontal sinus tenderness.  Mouth/Throat: Uvula is midline, oropharynx is clear and moist and mucous membranes are normal.  Cardiovascular: Normal rate, regular rhythm and normal heart sounds.   Pulmonary/Chest: Effort normal and breath sounds normal.  Lymphadenopathy:       Head (right side): Posterior auricular adenopathy present. No submental, no submandibular, no tonsillar and no preauricular adenopathy present.       Head (left side): No submental, no submandibular, no tonsillar, no preauricular and no posterior auricular adenopathy present.    She has no cervical adenopathy.  Neurological: She is alert.  Skin: Skin is warm and dry. No rash noted. No erythema.  Psychiatric: She has a normal mood and affect.    ED Course  Procedures (including critical care time)  Labs  Reviewed - No data to display No results found.   No diagnosis found.    MDM  Patient presenting with posterior auricular lymphadenopathy.  Lymph nodes are not fixed.  Patient afebrile.  No other symptoms, aside from nasal congestion.  Patient instructed to follow up with PCP.        Pascal Lux [place], PA-C 04/01/12 2330

## 2012-03-30 NOTE — Discharge Instructions (Signed)
Ganglios linfticos inflamados (Swollen Lymph Nodes) El sistema linftico filtra los lquidos que rodean a las clulas. Es similar al sistema de vasos sanguneos. Estos canales transportan linfa en vez de sangre. El sistema linftico es una parte importante del sistema inmunitario (el que lucha contra las enfermedades). Cuando las personas hablan de "glndulas inflamadas en el cuello" generalmente se refieren a la inflamacin de los ganglios linfticos. Los ganglios linfticos son como pequeas trampas para las infecciones. Usted y Mining engineer que lo asiste pueden palpar los ganglios linfticos, especialmente los que estn inflamados, en estas zonas: en la ingle, en la axila y en la zona que se encuentra por encima de la clavcula. Tambin podr palparlos en el cuello (zona cervical) y en la parte posterior de la cabeza, justo por encima de la lnea del cuero cabelludo (zona occipital). Los ganglios linfticos inflamados aparecen cuando hay alguna enfermedad en la que el organismo responde con cierto tipo de Automotive engineer. Por ejemplo, los ganglios del cuello pueden inflamarse por la picadura de un insecto o por cualquier infeccin menor en la cabeza. Estos son muy fciles de advertir en los nios que sufren un trastorno leve. Los ganglios linfticos tambin se inflaman cuando hay un tumor o un problema con el sistema linftico, como en la enfermedad de Hodgkin. TRATAMIENTO  La mayor parte de los ganglios inflamados no requieren TEFL teacher. Tendrn que observarse durante un breve perodo, si el profesional lo considera necesario. En general, no es necesaria la observacin.   El profesional que lo asiste podr prescribirle antibiticos (medicamentos que destruyen grmenes). El profesional podr prescribirlos si considera que la inflamacin de los ganglios se debe a una infeccin bacteriana (por grmenes). Los antibiticos no se utilizan si la causa de la inflamacin es un virus.  INSTRUCCIONES PARA  EL CUIDADO DOMICILIARIO  Tome los [employer] indic el profesional que lo asiste. Utilice los medicamentos de venta libre o de prescripcin para Chief Technology Officer, Environmental health practitioner o la [place], segn se lo indique el profesional que lo asiste.  SOLICITE ATENCIN MDICA SI:  La temperatura se eleva sin motivo por encima de 102 F (38.9 C) o segn le indique el profesional que lo asiste.  EST SEGURO QUE:   Comprende las instrucciones para el alta mdica.   Controlar su enfermedad.   Solicitar atencin mdica de inmediato segn las indicaciones.  Document Released: 07/20/2005 Document Revised: 09/29/2011 [facility] Patient Information 2012 [place], [place].  RESOURCE GUIDE  Chronic Pain Problems: Contact [facility]  [phone] Patients need to be referred by their primary care doctor.  Insufficient Money for Medicine: Contact United Way:  call "211" or [facility] [phone].  No Primary Care Doctor: - Call Health Connect  [phone] - can help you locate a primary care doctor that  accepts your insurance, provides certain services, etc. - Physician Referral [phone]  Agencies that provide inexpensive medical care: - [facility]  [phone] - [facility]  [phone] - [facility]  [phone] [facility]  [phone] - Planned Parenthood  [phone] [facility]  [phone]  Medicaid-accepting [facility] Providers: - [facility]- [address]  [phone], Mon-Fri 9am-7pm, Sat 9am-1pm - [facility]- [address] [place], [address] [place]  [phone] - [facility]- [address] [place]  [phone] [facility]- [address]  [phone] Renaye [phone]  [employer], [address], [phone]  Only accepts [place] [employer] patients after they have their name  applied to their  card  Self Pay (no insurance) in [facility]: - Sickle Cell Patients: Dr Willey Blade, [facility]  [address] [place], [phone] - [facility]- [address] [place]  [phone]       [facility] [place]- [address]       -     [facility]- see information above (Speak to [employer] if you do not have insurance)       -  [facility]- [address] [place], [phone]       -  [facility]- [address],  [phone]       -  [facility]- [address], [phone]       -  Dr Julio Sicks-  [address], [place], [phone]       -  [facility]- [address], [phone]       -  [facility]- [address] [phone], also [address], [phone]       -    [facility]- [address] [place], [phone], 1st & 3rd Saturday   every month, 10am-1pm  1) Find a Doctor and Pay Out of Pocket Although you won't have to find out who is covered by your insurance plan, it is a good idea to ask around and get recommendations. You will then need to call the office and see if the doctor you have chosen will accept you as a new patient and what types of options they offer for patients who are self-pay. Some doctors offer discounts or will set up payment plans for their patients who do not have insurance, but you will need to ask so you aren't surprised when you get to your appointment.  2) Contact Your Local Health Department Not all health departments have doctors that can see patients for sick visits, but many do, so it is worth a call to see if yours does. If you don't know where your local health department is, you can check in your phone book. The CDC also has a tool to help you locate your state's health department, and many state websites also have listings of all of their local health departments.  3) Find a Walk-in Clinic If your illness is not  likely to be very severe or complicated, you may want to try a walk in clinic. These are popping up all over the country in pharmacies, drugstores, and shopping centers. They're usually staffed by nurse practitioners or physician assistants that have been trained to treat common illnesses and complaints. They're usually fairly quick and inexpensive. However, if you have serious medical issues or chronic medical problems, these are probably not your best option  STD Testing - [facility] Department of [facility], STD Clinic, [address] [place], phone [phone] or [phone].  Monday - Friday, call for an appointment. [facility] Department of [employer], STD Clinic, [address], [place], phone [phone] or [phone].  Monday - Friday, call for an appointment.  Abuse/Neglect: [facility] Child Abuse Hotline 475-263-4044 [facility] Child Abuse Hotline (310) 358-6755 (After Hours)  Emergency Shelter:  [facility] 256-824-5618  Maternity Homes: -  Room at the [facility] 2028483337 - [facility] Services [phone]  MRSA Hotline #:   717-194-4903  [facility] Resources  [facility]  United Way [facility] Dept. [address]                 [address]         [address] [place] [address]  Blondell Reveal Phone:  [phone]                                  Phone:  [phone]                   Phone:  [phone]  [facility], [phone] - [phone]       -     [facility] in [place], [address]                                  [phone], [facility] Child Abuse Hotline 936-320-9701 or 801-638-5365 (After Hours)   Behavioral  Health Services  Substance Abuse Resources: - Alcohol and Drug Services  8546249446 - [facility] [phone] - [facility] [place] [phone] Floydene Flock [phone] - Residential & Outpatient Substance Abuse Program  2310397644  Psychological Services: [facility]  [phone] Services  [phone] - [facility], [phone] [place]. [address], [place], ACCESS LINE: 647-129-4459 or 848-403-7580, EntrepreneurLoan.co.za  Dental Assistance  If unable to pay or uninsured, contact:  [facility] or [facility]. to become qualified for the adult dental clinic.  Patients with Medicaid: [facility] [phone] [address], [phone] [address] [phone]  If unable to pay, or uninsured, contact [facility] [phone]) or [facility] Department [phone] in [place], [phone] in [facility]) to become qualified for the adult dental clinic  Other Low-Cost Community Dental Services: - [facility]- [address] [place], [place], [place], [address], [phone], [address], 2nd and 4th Thursday of the month at 6:30am.  10 clients each day by appointment, can sometimes see walk-in patients if someone does not show for an appointment. [facility]- [address] [place] Griffins [place], [place], [address], [phone] - [facility]- [address], [place], [place], [address], [phone] - [facility]- [phone] [facility] Health Department- [phone] [facility] Department- [phone]

## 2012-04-02 NOTE — ED Provider Notes (Signed)
Medical screening examination/treatment/procedure(s) were performed by non-physician practitioner and as supervising physician I was immediately available for consultation/collaboration.  Shayma Pfefferle T Ahkeem Goede, MD 04/02/12 1456 

## 2012-11-30 IMAGING — CT CT ABD-PELV W/ CM
1 of 2 series · 14 of 32 positions shown, 18 images · IV contrast (omnipaque)
Comparison: Prior CTs of the abdomen and pelvis, the most recent of
which was performed 09/21/2011, and pelvic ultrasound performed
09/21/2011.

CLINICAL DATA: Status post appendectomy left ovary; persistent
abdominal pain and suspected abscess, not improving on antibiotics.

CT ABDOMEN AND PELVIS WITH CONTRAST
TECHNIQUE: Multidetector CT imaging of the abdomen and pelvis was
performed following the standard protocol during bolus
administration of intravenous contrast.
Contrast: 100mL OMNIPAQUE IOHEXOL 300 MG/ML IV SOLN

[Series 2: abd/pel with · axial · 0.78mm/px · z∈[+1031,+1436]mm · 14 of 91 slices shown, 18 images]
[im 5/91  soft-tissue]
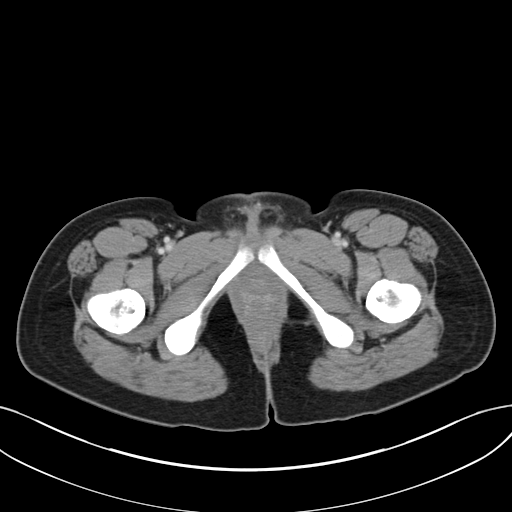
[im 5/91  bone]
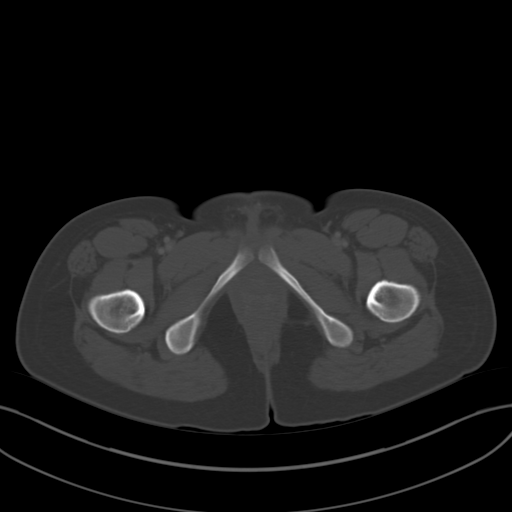
[im 13/91  soft-tissue]
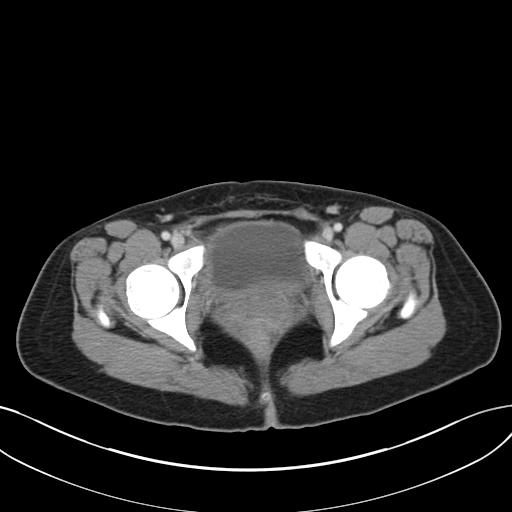
[im 21/91  soft-tissue]
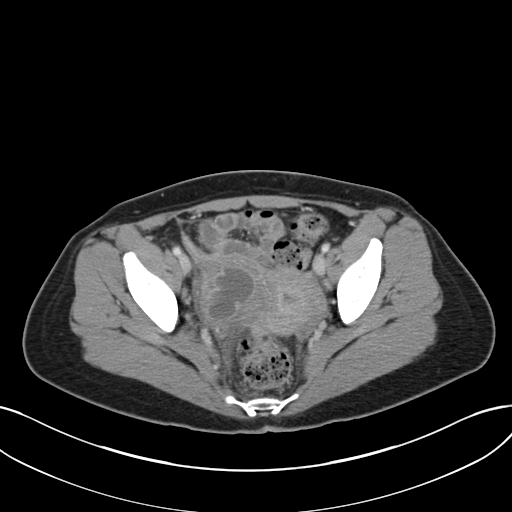
[im 29/91  soft-tissue]
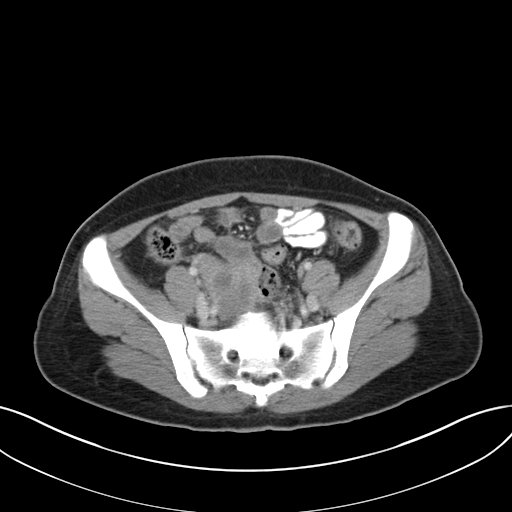
[im 33/91  soft-tissue]
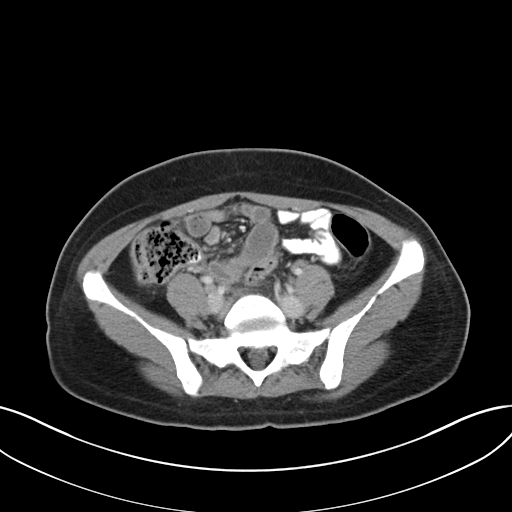
[im 41/91  soft-tissue]
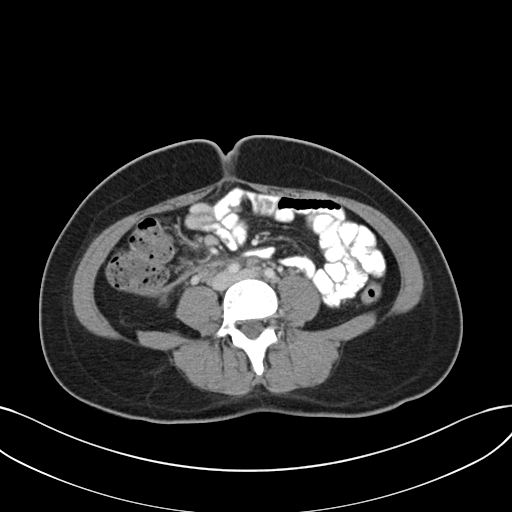
[im 50/91  soft-tissue]
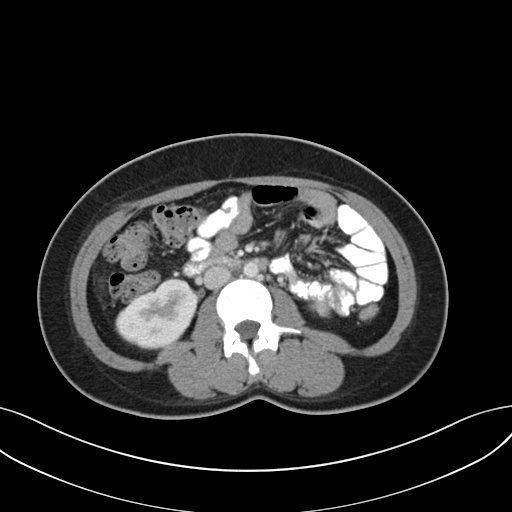
[im 58/91  soft-tissue]
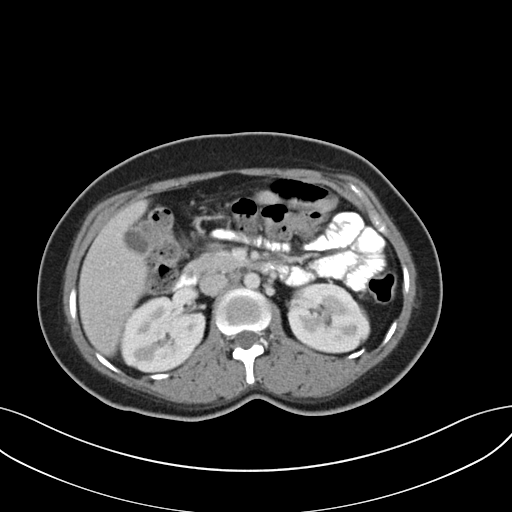
[im 62/91  soft-tissue]
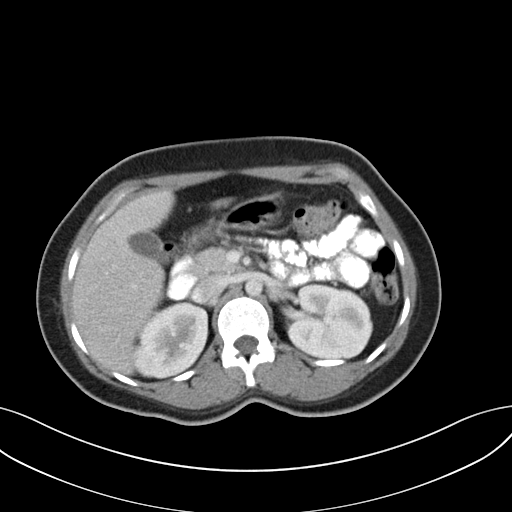
[im 62/91  bone]
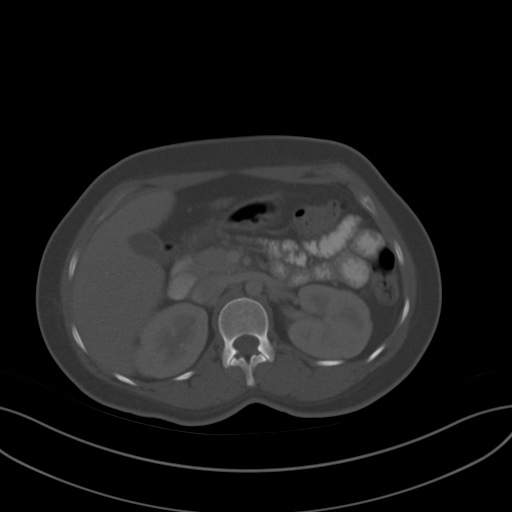
[im 70/91  soft-tissue]
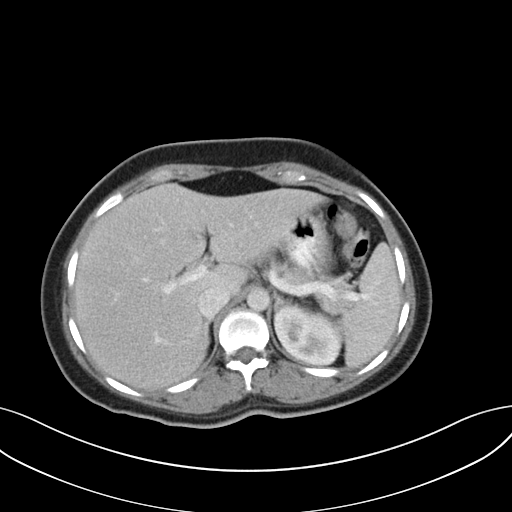
[im 74/91  lung]
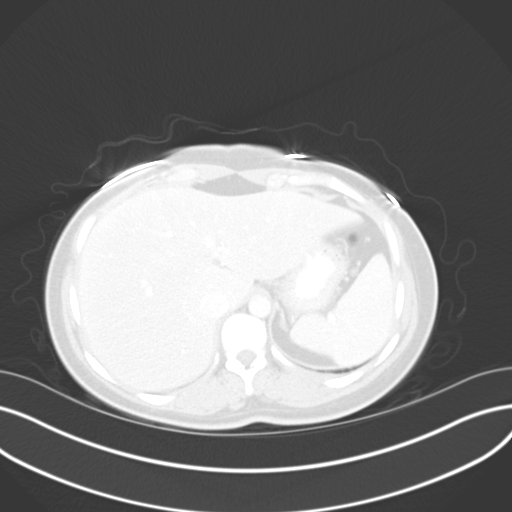
[im 78/91  soft-tissue]
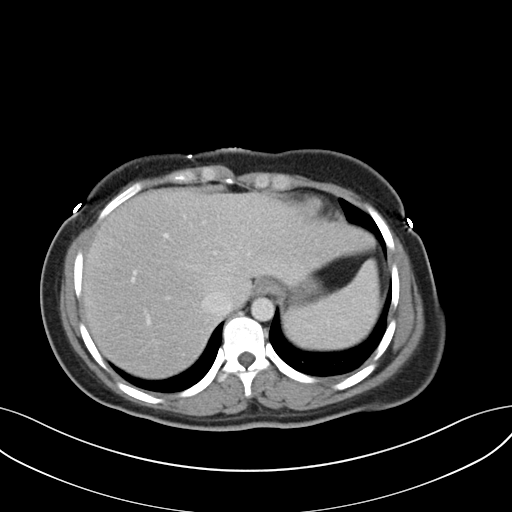
[im 78/91  lung]
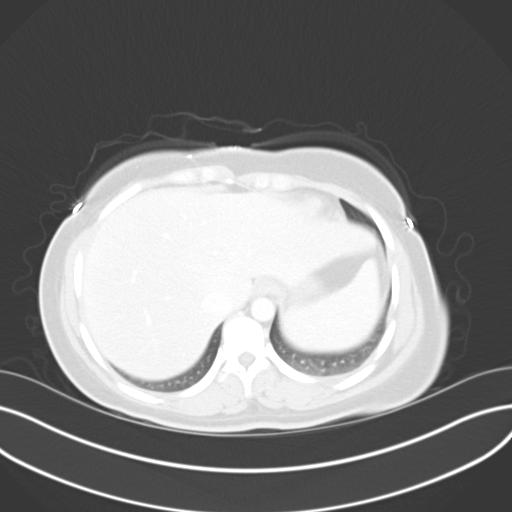
[im 82/91  lung]
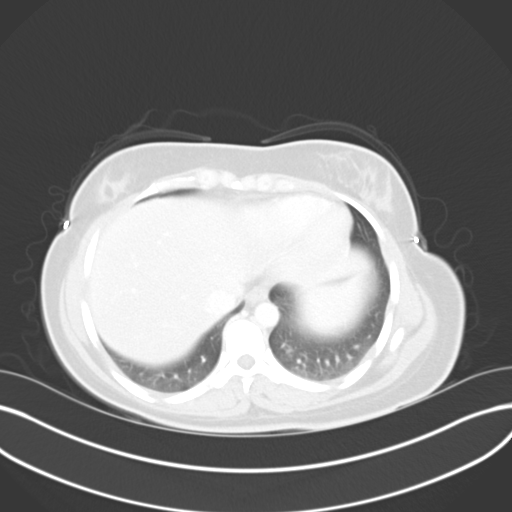
[im 86/91  soft-tissue]
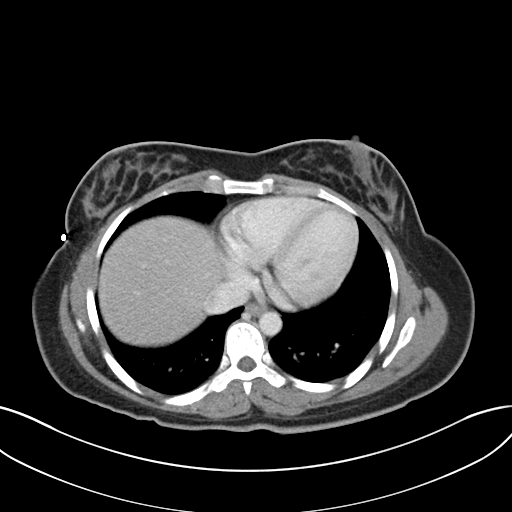
[im 86/91  lung]
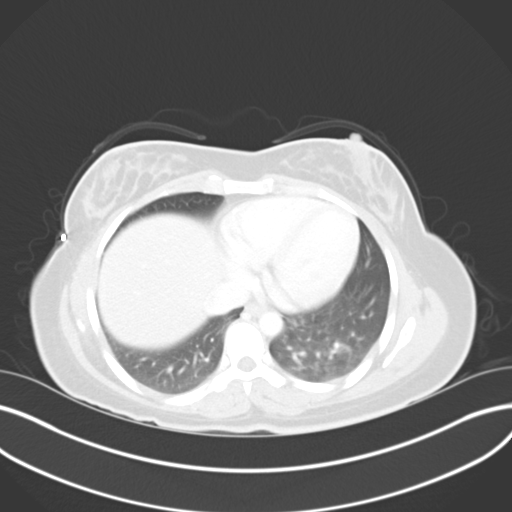

[14 of 32 positions shown; findings below may reference images not displayed]

FINDINGS: The visualized lung bases are clear.

The liver and spleen are unremarkable in appearance.  The
gallbladder is within normal limits.  The pancreas and adrenal
glands are unremarkable.

Mild bilateral fetal lobulations are noted.  The kidneys are
unremarkable in appearance.  There is no evidence of
hydronephrosis.  No renal or ureteral stones are seen.  No
perinephric stranding is appreciated.

The small bowel is unremarkable in appearance.  The stomach is
within normal limits.  No acute vascular abnormalities are seen.

There has been mild interval increase in the size of the complex
loculated fluid collection occupying the expected location of the
right ovary, with diffuse peripheral enhancement and soft tissue
inflammation tracking about the rectum. This now measures
approximately 6.1 x 5.8 x 4.4 cm, and given its appearance on CT,
remains most concerning for a tubo-ovarian abscess.

Given the prior ultrasound, it is possible that a hemorrhagic cyst
could have this appearance, but its features, the surrounding soft
tissue inflammation and the degree of loculation makes a
hemorrhagic cyst less likely.

There has been slight interval improvement in the degree of
retroperitoneal soft tissue inflammation status post appendectomy,
though mildly increased soft tissue inflammation is noted at the
right lower quadrant and pelvis since the prior study.  No
significant free fluid is seen to suggest leak.

The colon is grossly unremarkable in appearance.

The bladder is mildly distended; mild wall thickening likely
reflects the adjacent inflammatory process.  The uterus is
displaced to the left, and also appears mildly inflamed, with trace
surrounding fluid.  The left ovary is not well assessed but appears
grossly unremarkable.  No inguinal lymphadenopathy is seen.

No acute osseous abnormalities are identified.
IMPRESSION: 1.  Mild interval increase in size of complex loculated
peripherally enhancing fluid collection at the right ovary, now
measuring 6.1 x 5.8 x 4.4 cm.  Given surrounding soft tissue
inflammation, the degree of loculation and associated findings,
this remains most suspicious for tubo-ovarian abscess.

Per the prior ultrasound, it remains possible that a hemorrhagic
cyst could have this appearance, but this is considered
significantly less likely.
2.  Mildly increased soft tissue inflammation noted at the right
lower quadrant and pelvis; mild interval improvement in soft tissue
inflammation more superiorly in the retroperitoneum.
3.  Mild wall thickening involving the bladder, likely reflecting
the adjacent inflammatory process; mild inflammation with respect
to the displaced uterus.

Findings were discussed with Dr. Beautybelle Langenjaya at [DATE] p.m. on
09/28/2011.

## 2012-12-05 IMAGING — CT CT ABCESS DRAINAGE
2 of 6 series · 12 of 32 positions shown, 18 images · IV contrast (& 75ml omni 300)
Comparison: CT of the abdomen and pelvis - 09/28/2011;

INDICATION: Enlarging pelvic abscess post appendectomy despite
antibiotics; Persistent abdominal pain, fever and chills

CT GUIDED RIGHT SIDED TRANSGLTEAL DRAINAGE CATHETER PLACEMENT

[Series 3: routine abdomen · axial · 0.67mm/px · z∈[-420,-255]mm · 9 of 43 slices shown, 15 images (1 of 2)]
[im 5/43  soft-tissue]
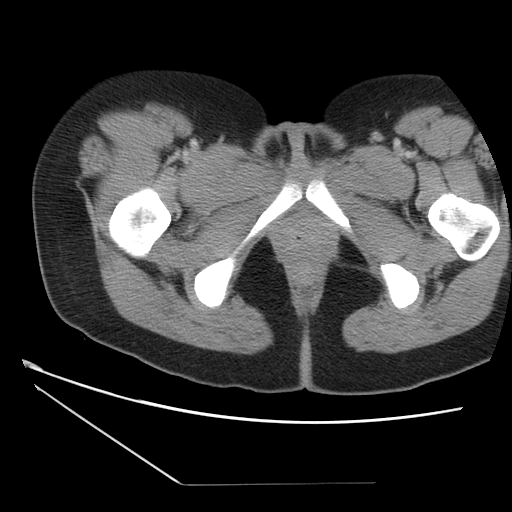
[im 5/43  bone]
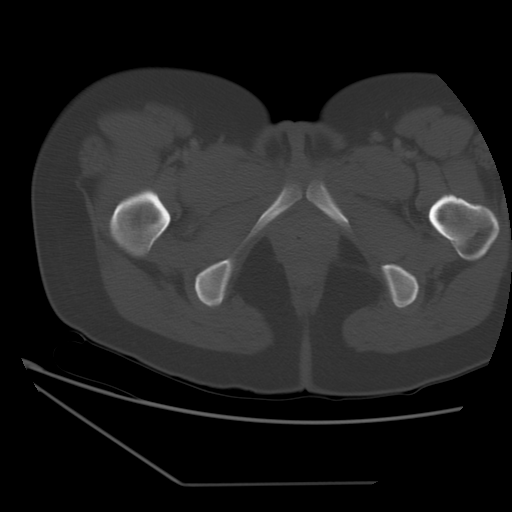
[im 9/43  soft-tissue]
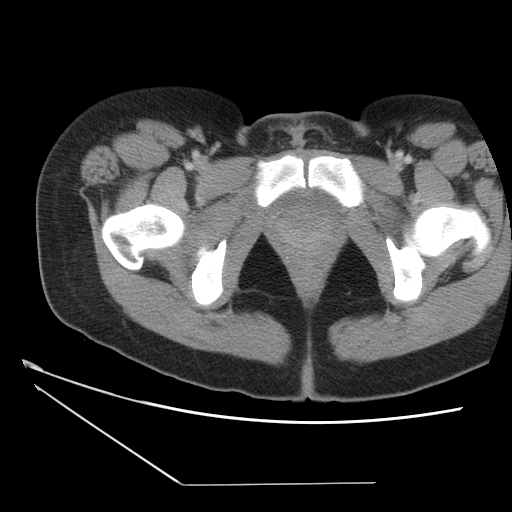
[im 13/43  soft-tissue]
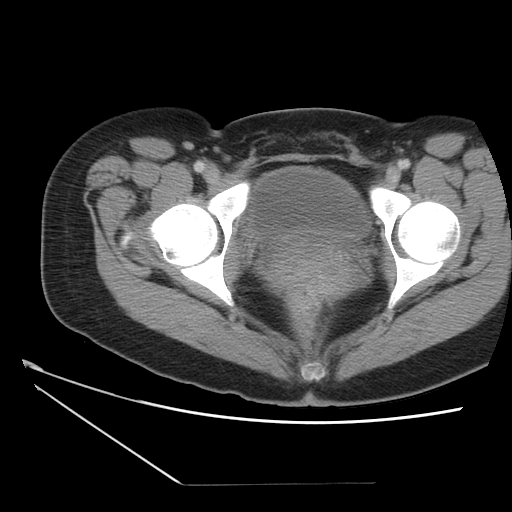
[im 17/43  soft-tissue]
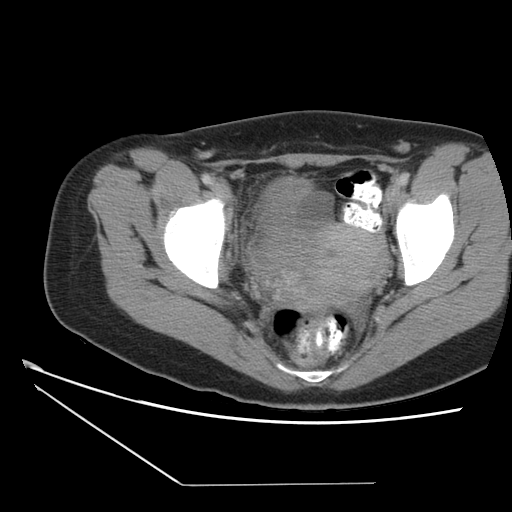
[im 22/43  soft-tissue]
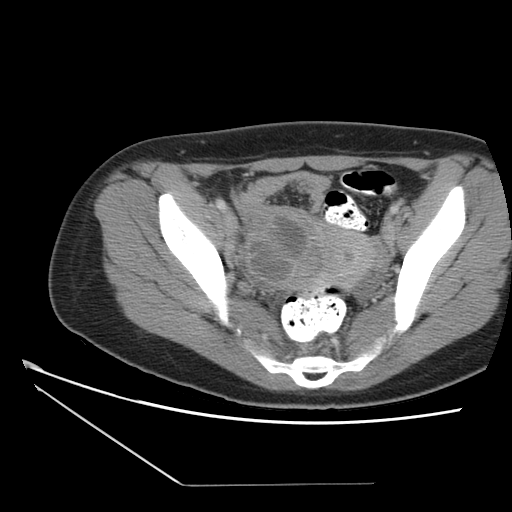
[im 26/43  soft-tissue]
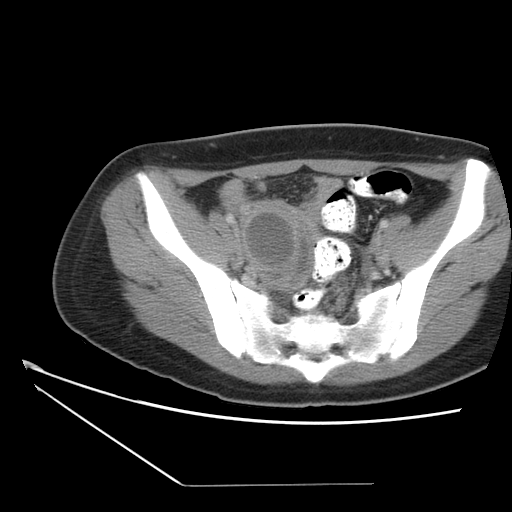
[im 26/43  lung]
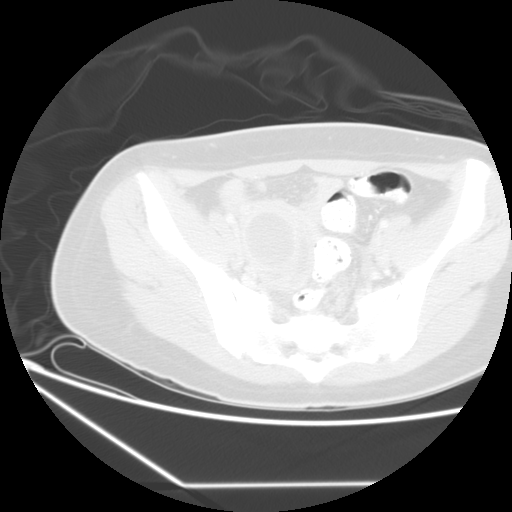
[im 30/43  soft-tissue]
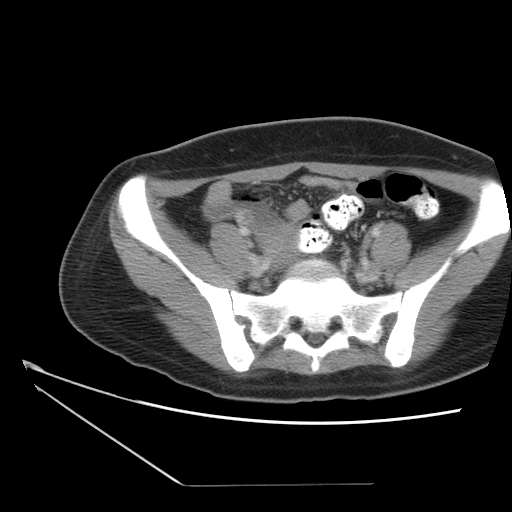
[im 30/43  lung]
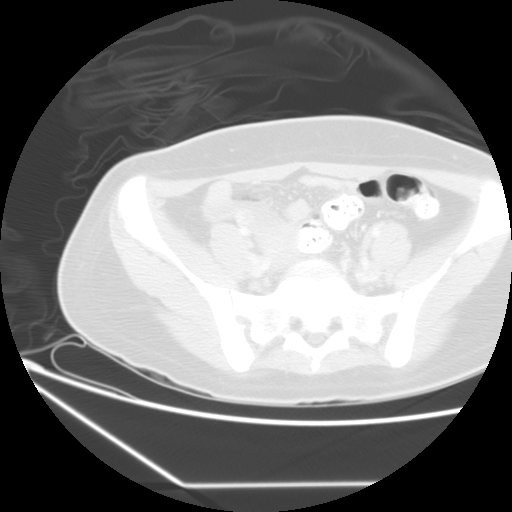
[im 34/43  soft-tissue]
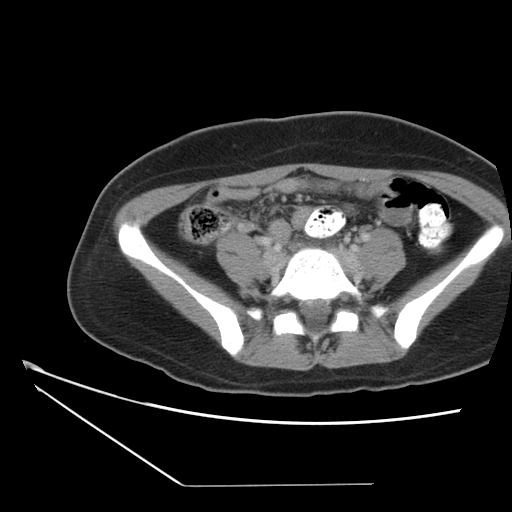
[im 34/43  lung]
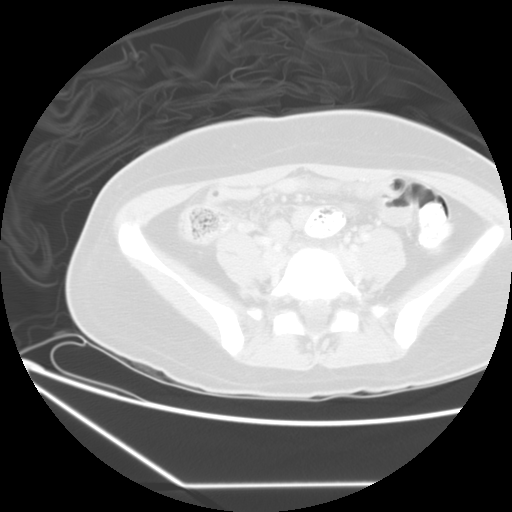
[im 38/43  soft-tissue]
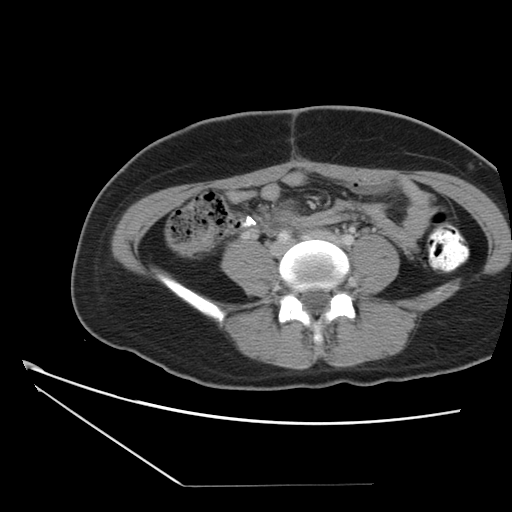
[im 38/43  lung]
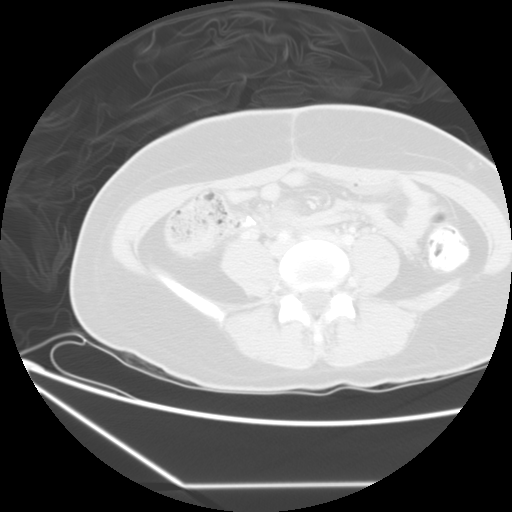
[im 38/43  bone]
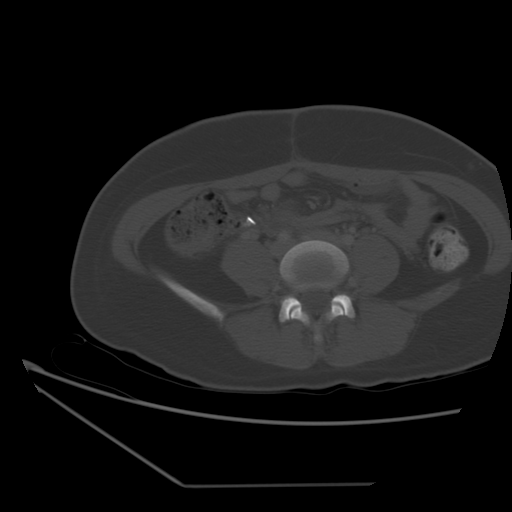

[Series 5: routine abdomen · axial · 0.60mm/px · z∈[-324,-300]mm · 3 of 40 slices shown (2 of 2)]
[im 5/40  soft-tissue]
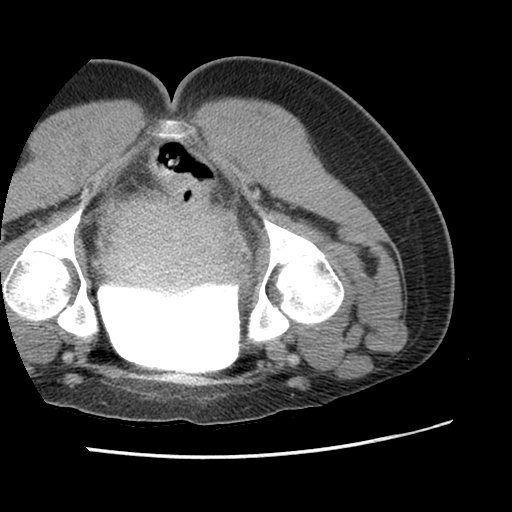
[im 10/40  soft-tissue]
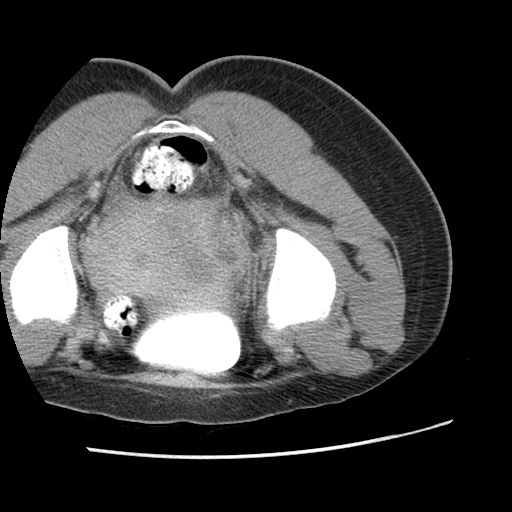
[im 15/40  soft-tissue]
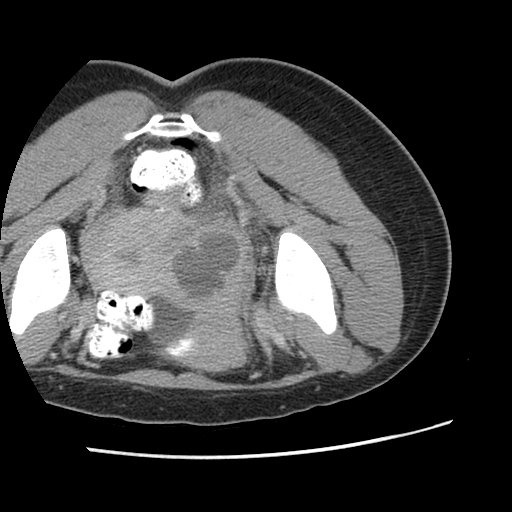

[12 of 32 positions shown; findings below may reference images not displayed]

09/21/2011;
08/27/2011; 08/12/2011

Medications: Fentanyl 100 mcg IV; Versed 2 mg IV

Total Moderate Sedation time: 20 minutes

CT fluoroscopy time:  9 seconds

Intravenous Contrast: 150 ml Isovue 300

Complications: None immediate

Technique / Findings:

Informed written consent was obtained from the patient (via a
Spanish interpreter) after a discussion of the risks, benefits and
alternatives to treatment. Initially the patient was placed supine
on the CT gantry and a pre procedural CT was performed with
contrast re-demonstrating the known abscess/fluid collection within
the right lower pelvis.  Due to lack of adequate window, the
patient was placed prone and repeat pelvic CT with contrast was
performed to help delineate an appropriate window.  Due to the
excretion of contrast within the urinary bladder, the patient was
asked to void and a final planning scan was performed with the
patient positioned prone demonstrating an adequate window to the
right pelvic collection via a right transgluteal approach.  The
procedure was planned.   A timeout was performed prior to the
initiation of the procedure.

The medial aspect of the right buttock was prepped and draped in
the usual sterile fashion.   The overlying soft tissues were
anesthetized with 1% lidocaine with epinephrine.  Appropriate
trajectory was planned initially with a 22 gauge spinal needle with
special attention paid to avoid the pelvic sidewall vasculature.
An 18 gauge trocar needle was advanced in to the abscess/fluid
collection under intermittent CT fluoroscopic guidance.  A short
Amplatz super stiff wire was coiled within the abscess/fluid
collection.   Appropriate positioning was confirmed with a limited
CT scan.  The tract was serially dilated allowing placement of a 10
French all-purpose drainage catheter.  Appropriate positioning was
confirmed with a limited postprocedural CT scan.  40 ml of thick
purulent fluid was aspirated.  The tube was connected to a JP bulb
and sutured in place.  A dressing was placed.  The patient
tolerated the procedure well without immediate post procedural
complication.
IMPRESSION: Successful CT guided placement of a 10 French all purpose drain
catheter into the right pelvic abscess with aspiration of 40 mL of
purulent fluid.  Samples were sent to the laboratory as requested
by the ordering clinical team.

## 2012-12-21 ENCOUNTER — Inpatient Hospital Stay (HOSPITAL_COMMUNITY)
Admission: AD | Admit: 2012-12-21 | Discharge: 2012-12-21 | Disposition: A | Discharge status: Home / Self Care | Payer: Self-pay | Source: Ambulatory Visit | Attending: Obstetrics and Gynecology | Admitting: Obstetrics and Gynecology

## 2012-12-21 ENCOUNTER — Inpatient Hospital Stay (HOSPITAL_COMMUNITY): Payer: Self-pay

## 2012-12-21 ENCOUNTER — Encounter (HOSPITAL_COMMUNITY): Payer: Self-pay | Admitting: Family

## 2012-12-21 DIAGNOSIS — R109 Unspecified abdominal pain: Secondary | ICD-10-CM | POA: Insufficient documentation

## 2012-12-21 DIAGNOSIS — N949 Unspecified condition associated with female genital organs and menstrual cycle: Secondary | ICD-10-CM | POA: Insufficient documentation

## 2012-12-21 DIAGNOSIS — R3129 Other microscopic hematuria: Secondary | ICD-10-CM | POA: Insufficient documentation

## 2012-12-21 LAB — URINALYSIS, ROUTINE W REFLEX MICROSCOPIC
Ketones, ur: NEGATIVE mg/dL
Leukocytes, UA: NEGATIVE
Nitrite: NEGATIVE
pH: 6.5 (ref 5.0–8.0)

## 2012-12-21 LAB — POCT PREGNANCY, URINE: Preg Test, Ur: NEGATIVE

## 2012-12-21 LAB — WET PREP, GENITAL
Trich, Wet Prep: NONE SEEN
WBC, Wet Prep HPF POC: NONE SEEN
Yeast Wet Prep HPF POC: NONE SEEN

## 2012-12-21 LAB — URINE MICROSCOPIC-ADD ON

## 2012-12-21 MED ORDER — NAPROXEN 500 MG PO TABS: 500.0000 mg | ORAL_TABLET | Freq: Two times a day (BID) | ORAL | Status: DC

## 2012-12-21 MED ORDER — SULFAMETHOXAZOLE-TMP DS 800-160 MG PO TABS: 1.0000 | ORAL_TABLET | Freq: Two times a day (BID) | ORAL | Status: DC

## 2012-12-21 NOTE — MAU Note (Signed)
?   Problems with IUD. After intercourse had a really bad pain in abd .(this morning).  Has been seeing blood after urination, not every day- but going on for over a month.  No period in 2 months.  IUD placed a year ago- at [facility]

## 2012-12-21 NOTE — MAU Provider Note (Signed)
History     CSN: [id number]  Arrival date and time: 12/21/12 1603   First Provider Initiated Contact with Patient 12/21/12 1750      Chief Complaint  Patient presents with  . Abdominal Pain   HPI 27 y.o. [id number] with pelvic pain starting this morning after intercourse. Has Mirena IUD in place. Has had some irregular bleeding, bleeding today. Some dysuria today as well.    Past Medical History  Diagnosis Date  . Pelvic abscess     Past Surgical History  Procedure Laterality Date  . Appendectomy  10/12    s/p abscess w/drain -drain d/c 12/27  . Cystoscopy  10/24/2011    Procedure: CYSTOSCOPY;  Surgeon: Lindaann Slough, MD;  [facility];  Service: Urology;  Laterality: Right;  cysto and stent placement  c arm    History reviewed. No pertinent family history.  History  Substance Use Topics  . Smoking status: Never Smoker   . Smokeless tobacco: Never Used  . Alcohol Use: No    Allergies: No Known Allergies  No prescriptions prior to admission    Review of Systems  Constitutional: Negative.   Respiratory: Negative.   Cardiovascular: Negative.   Gastrointestinal: Positive for abdominal pain. Negative for nausea, vomiting, diarrhea and constipation.  Genitourinary: Positive for dysuria. Negative for urgency, frequency, hematuria and flank pain.       Positive for bleeding, negative for discharge   Musculoskeletal: Negative.   Neurological: Negative.   Psychiatric/Behavioral: Negative.    Physical Exam   Blood pressure 113/64, pulse 73, temperature 98.1 F (36.7 C), temperature source Oral, resp. rate 18, height 5' 1.5" (1.562 m), weight 151 lb (68.493 kg), last menstrual period 10/14/2012.  Physical Exam  Constitutional: She is oriented to person, place, and time. She appears well-developed and well-nourished. No distress.  HENT:  Head: Normocephalic and atraumatic.  Cardiovascular: Normal rate and regular rhythm.   Respiratory:  Effort normal. No respiratory distress.  GI: Soft. She exhibits no distension and no mass. There is no tenderness. There is no rebound and no guarding.  Genitourinary: There is no rash or lesion on the right labia. There is no rash or lesion on the left labia. Uterus is tender. Uterus is not deviated, not enlarged and not fixed. Cervix exhibits no motion tenderness, no discharge and no friability. Right adnexum displays tenderness. Right adnexum displays no mass and no fullness. Left adnexum displays tenderness. Left adnexum displays no mass and no fullness. There is bleeding (scant) around the vagina. No erythema or tenderness around the vagina. No vaginal discharge found.  Neurological: She is alert and oriented to person, place, and time.  Skin: Skin is warm and dry.  Psychiatric: She has a normal mood and affect.    MAU Course  Procedures Results for orders placed during the hospital encounter of 12/21/12 (from the past 24 hour(s))  URINALYSIS, ROUTINE W REFLEX MICROSCOPIC     Status: Abnormal   Collection Time    12/21/12  4:35 PM      Result Value Range   Color, Urine YELLOW  YELLOW   APPearance CLEAR  CLEAR   Specific Gravity, Urine 1.025  1.005 - 1.030   pH 6.5  5.0 - 8.0   Glucose, UA NEGATIVE  NEGATIVE mg/dL   Hgb urine dipstick MODERATE (*) NEGATIVE   Bilirubin Urine NEGATIVE  NEGATIVE   Ketones, ur NEGATIVE  NEGATIVE mg/dL   Protein, ur NEGATIVE  NEGATIVE mg/dL   Urobilinogen, UA 1.0  0.0 - 1.0 mg/dL   Nitrite NEGATIVE  NEGATIVE   Leukocytes, UA NEGATIVE  NEGATIVE  URINE MICROSCOPIC-ADD ON     Status: Abnormal   Collection Time    12/21/12  4:35 PM      Result Value Range   Squamous Epithelial / LPF FEW (*) RARE   WBC, UA 0-2  <3 WBC/hpf   RBC / HPF 3-6  <3 RBC/hpf   Bacteria, UA FEW (*) RARE   Urine-Other MUCOUS PRESENT    POCT PREGNANCY, URINE     Status: None   Collection Time    12/21/12  4:43 PM      Result Value Range   Preg Test, Ur NEGATIVE  NEGATIVE  WET  PREP, GENITAL     Status: None   Collection Time    12/21/12  5:38 PM      Result Value Range   Yeast Wet Prep HPF POC NONE SEEN  NONE SEEN   Trich, Wet Prep NONE SEEN  NONE SEEN   Clue Cells Wet Prep HPF POC NONE SEEN  NONE SEEN   WBC, Wet Prep HPF POC NONE SEEN  NONE SEEN   US Transvaginal Non-ob  12/21/2012  *RADIOLOGY REPORT*  Clinical Data: pelvic pain, vaginal bleeding.  TRANSABDOMINAL AND TRANSVAGINAL ULTRASOUND OF PELVIS  Technique:  Both transabdominal and transvaginal ultrasound examinations of the pelvis were performed including evaluation of the uterus, ovaries, adnexal regions, and pelvic cul-de-sac.  Comparison: CT 02/06/2012  Findings:  Uterus: 7.3 x 4.7 x 86.5 cm.  Normal echotexture.  No focal abnormality.  Endometrium: Homogeneous, 12 mm in thickness.  IUD in place within the endometrium.  Right Ovary: 3.1 x 2.3 x 3.1 cm. Normal size and echotexture.  No adnexal masses.  Left Ovary: 3.7 x 1.2 x 2.8 cm. Normal size and echotexture.  No adnexal masses.  Other Findings:  Moderate free fluid in the pelvis.  IMPRESSION: IUD in place within the endometrium.  Moderate free fluid in the pelvis.   Original Report Authenticated By: Charlett Nose, M.D.    US Pelvis Complete  12/21/2012  *RADIOLOGY REPORT*  Clinical Data: pelvic pain, vaginal bleeding.  TRANSABDOMINAL AND TRANSVAGINAL ULTRASOUND OF PELVIS  Technique:  Both transabdominal and transvaginal ultrasound examinations of the pelvis were performed including evaluation of the uterus, ovaries, adnexal regions, and pelvic cul-de-sac.  Comparison: CT 02/06/2012  Findings:  Uterus: 7.3 x 4.7 x 86.5 cm.  Normal echotexture.  No focal abnormality.  Endometrium: Homogeneous, 12 mm in thickness.  IUD in place within the endometrium.  Right Ovary: 3.1 x 2.3 x 3.1 cm. Normal size and echotexture.  No adnexal masses.  Left Ovary: 3.7 x 1.2 x 2.8 cm. Normal size and echotexture.  No adnexal masses.  Other Findings:  Moderate free fluid in the pelvis.   IMPRESSION: IUD in place within the endometrium.  Moderate free fluid in the pelvis.   Original Report Authenticated By: Charlett Nose, M.D.     Assessment and Plan   1. Pelvic pain in female   2. Hematuria, microscopic   Normal u/s, blood on U/A and + dysuria - will treat for UTI, f/u PRN    Medication List    TAKE these medications       naproxen 500 MG tablet  Commonly known as:  NAPROSYN  Take 1 tablet (500 mg total) by mouth 2 (two) times daily with a meal.     sulfamethoxazole-trimethoprim 800-160 MG per tablet  Commonly known as:  BACTRIM  DS  Take 1 tablet by mouth 2 (two) times daily.            Follow-up Information   Follow up with [facility]. (As needed)    Contact information:   [address] [place] [address] [phone]        [facility] 12/21/2012, 7:50 PM

## 2012-12-21 NOTE — MAU Note (Signed)
Had periods with IUD until Jan.

## 2012-12-21 NOTE — MAU Note (Signed)
Patient presents to MAU with c/o intermittent vaginal bleeding since December; patient has IUD and reports lower abdominal cramping after intercourse this am.  LMP 10/14/12.

## 2012-12-21 NOTE — MAU Note (Signed)
Frequent urination, some cramping

## 2012-12-22 LAB — GC/CHLAMYDIA PROBE AMP
CT Probe RNA: NEGATIVE
GC Probe RNA: NEGATIVE

## 2012-12-26 NOTE — MAU Provider Note (Signed)
Attestation of Attending Supervision of Advanced Practitioner (CNM/NP): Evaluation and management procedures were performed by the Advanced Practitioner under my supervision and collaboration.  I have reviewed the Advanced Practitioner's note and chart, and I agree with the management and plan.  Jaecob Lowden 12/26/2012 8:56 AM

## 2014-08-25 ENCOUNTER — Encounter (HOSPITAL_COMMUNITY): Payer: Self-pay | Admitting: Family

## 2014-11-28 ENCOUNTER — Emergency Department (HOSPITAL_COMMUNITY)
Admission: EM | Admit: 2014-11-28 | Discharge: 2014-11-28 | Disposition: A | Discharge status: Home / Self Care | Payer: Self-pay | Source: Home / Self Care | Attending: Family Medicine | Admitting: Family Medicine

## 2014-11-28 ENCOUNTER — Encounter (HOSPITAL_COMMUNITY): Payer: Self-pay | Admitting: Emergency Medicine

## 2014-11-28 DIAGNOSIS — J020 Streptococcal pharyngitis: Secondary | ICD-10-CM

## 2014-11-28 LAB — POCT RAPID STREP A: Streptococcus, Group A Screen (Direct): NEGATIVE

## 2014-11-28 MED ORDER — AMOXICILLIN 500 MG PO CAPS: 500.0000 mg | ORAL_CAPSULE | Freq: Three times a day (TID) | ORAL | Status: DC

## 2014-11-28 NOTE — ED Provider Notes (Signed)
CSN: [id number]     Arrival date & time 11/28/14  1026 History   First MD Initiated Contact with Patient 11/28/14 1101     Chief Complaint  Patient presents with  . Sore Throat   (Consider location/radiation/quality/duration/timing/severity/associated sxs/prior Treatment) Patient is a 29 y.o. female presenting with pharyngitis. The history is provided by the patient.  Sore Throat This is a new problem. The current episode started more than 2 days ago. The problem has been gradually worsening. Pertinent negatives include no chest pain and no abdominal pain. The symptoms are aggravated by swallowing.    Past Medical History  Diagnosis Date  . Pelvic abscess    Past Surgical History  Procedure Laterality Date  . Appendectomy  10/12    s/p abscess w/drain -drain d/c 12/27  . Cystoscopy  10/24/2011    Procedure: CYSTOSCOPY;  Surgeon: Lindaann SloughMarc-Henry Nesi, MD;  [facility];  Service: Urology;  Laterality: Right;  cysto and stent placement  c arm   History reviewed. No pertinent family history. History  Substance Use Topics  . Smoking status: Never Smoker   . Smokeless tobacco: Never Used  . Alcohol Use: No   OB History    Gravida Para Term Preterm AB TAB SAB Ectopic Multiple Living   3 3 3       3      Review of Systems  Constitutional: Positive for fever.  HENT: Positive for sore throat.   Respiratory: Negative.   Cardiovascular: Negative for chest pain.  Gastrointestinal: Negative.  Negative for abdominal pain.  Hematological: Positive for adenopathy.    Allergies  Review of patient's allergies indicates no known allergies.  Home Medications   Prior to Admission medications   Medication Sig Start Date End Date Taking? Authorizing Provider  naproxen (NAPROSYN) 500 MG tablet Take 1 tablet (500 mg total) by mouth 2 (two) times daily with a meal. 12/21/12  Yes Archie PattenNatalie K Frazier, CNM  amoxicillin (AMOXIL) 500 MG capsule Take 1 capsule (500 mg total) by  mouth 3 (three) times daily. 11/28/14   Linna HoffJames D Kindl, MD  sulfamethoxazole-trimethoprim (BACTRIM DS) 800-160 MG per tablet Take 1 tablet by mouth 2 (two) times daily. 12/21/12   Archie PattenNatalie K Frazier, CNM   BP 117/76 mmHg  Pulse 84  Temp(Src) 98.5 F (36.9 C) (Oral)  Resp 14  SpO2 99%  LMP 11/27/2014 (Exact Date) Physical Exam  Constitutional: She is oriented to person, place, and time. She appears well-developed and well-nourished.  HENT:  Head: Normocephalic.  Right Ear: External ear normal.  Left Ear: External ear normal.  Mouth/Throat: Oropharyngeal exudate present.  Eyes: Conjunctivae are normal. Pupils are equal, round, and reactive to light.  Neck: Normal range of motion. Neck supple.  Cardiovascular: Normal rate, normal heart sounds and intact distal pulses.   Pulmonary/Chest: Effort normal and breath sounds normal.  Lymphadenopathy:    She has cervical adenopathy.  Neurological: She is alert and oriented to person, place, and time.  Skin: Skin is warm and dry.  Nursing note and vitals reviewed.   ED Course  Procedures (including critical care time) Labs Review Labs Reviewed  POCT RAPID STREP A ([facility] CARE ONLY)    Imaging Review No results found.   MDM   1. Strep sore throat        Linna HoffJames D Kindl, MD 11/28/14 [phone]

## 2014-11-28 NOTE — ED Notes (Signed)
Pt has been suffering from a sore throat for 3 days.  Pt states she has felt like she has had a fever, but did not take her temperature at home.

## 2014-11-28 NOTE — Discharge Instructions (Signed)
Drink lots of fluids, take all of medicine, use lozenges as needed.return if needed °

## 2014-11-30 LAB — CULTURE, GROUP A STREP

## 2014-12-03 ENCOUNTER — Telehealth (HOSPITAL_COMMUNITY): Payer: Self-pay | Admitting: *Deleted

## 2014-12-03 NOTE — ED Notes (Addendum)
Throat culture: Group A strep.  Pt. adequately treated with Amoxicillin.  I called pt. and left a message to call. Call 1. Melanie Rodriguez, Melanie Rodriguez 12/03/2014 Left message.  Call 2. 12/04/2014 Left message.  Call 3. 12/05/2014 Unable to reach pt. by phone x 3.  Confidential marked letter sent with result and instructions. 12/09/2014

## 2014-12-24 ENCOUNTER — Ambulatory Visit (INDEPENDENT_AMBULATORY_CARE_PROVIDER_SITE_OTHER): Payer: 59 | Admitting: Family Medicine

## 2014-12-24 VITALS — BP 134/74 | HR 81 | Temp 97.9°F | Resp 16 | Ht 62.0 in | Wt 161.8 lb

## 2014-12-24 DIAGNOSIS — R5383 Other fatigue: Secondary | ICD-10-CM

## 2014-12-24 DIAGNOSIS — R300 Dysuria: Secondary | ICD-10-CM

## 2014-12-24 DIAGNOSIS — R109 Unspecified abdominal pain: Secondary | ICD-10-CM | POA: Diagnosis not present

## 2014-12-24 DIAGNOSIS — N3001 Acute cystitis with hematuria: Secondary | ICD-10-CM

## 2014-12-24 LAB — POCT URINALYSIS DIPSTICK
BILIRUBIN UA: NEGATIVE
GLUCOSE UA: NEGATIVE
Ketones, UA: NEGATIVE
Nitrite, UA: NEGATIVE
PROTEIN UA: 30
Spec Grav, UA: 1.02
Urobilinogen, UA: 0.2
pH, UA: 7

## 2014-12-24 LAB — POCT CBC
Granulocyte percent: 63.4 %G (ref 37–80)
HEMATOCRIT: 36.5 % — AB (ref 37.7–47.9)
HEMOGLOBIN: 12.2 g/dL (ref 12.2–16.2)
LYMPH, POC: 2.8 (ref 0.6–3.4)
MCH, POC: 28.4 pg (ref 27–31.2)
MCHC: 33.6 g/dL (ref 31.8–35.4)
MCV: 84.7 fL (ref 80–97)
MID (cbc): 0.6 (ref 0–0.9)
MPV: 8.6 fL (ref 0–99.8)
POC GRANULOCYTE: 6 (ref 2–6.9)
POC LYMPH PERCENT: 29.8 %L (ref 10–50)
POC MID %: 6.8 %M (ref 0–12)
Platelet Count, POC: 252 10*3/uL (ref 142–424)
RBC: 4.31 M/uL (ref 4.04–5.48)
RDW, POC: 14.5 %
WBC: 9.4 10*3/uL (ref 4.6–10.2)

## 2014-12-24 LAB — POCT UA - MICROSCOPIC ONLY
CASTS, UR, LPF, POC: NEGATIVE
CRYSTALS, UR, HPF, POC: NEGATIVE
MUCUS UA: NEGATIVE
Yeast, UA: NEGATIVE

## 2014-12-24 LAB — POCT URINE PREGNANCY: PREG TEST UR: NEGATIVE

## 2014-12-24 MED ORDER — CIPROFLOXACIN HCL 500 MG PO TABS
500.0000 mg | ORAL_TABLET | Freq: Two times a day (BID) | ORAL | Status: DC
Start: 2014-12-24 — End: 2016-10-06

## 2014-12-24 NOTE — Patient Instructions (Signed)
It appears that you have a urinary tract infection.  We are going to treat you with cipro (antibioic) for this.  I will be in touch with the rest of your labs.  However, please let me know if you are not getting better over the next couple of days- Sooner if you are worse!

## 2014-12-24 NOTE — Progress Notes (Signed)
[facility] [address], [place] [address] [phone]  Date:  12/24/2014   Name:  Melanie Rodriguez   DOB:  [birth year]   MRN:  [id number]  PCP:  PROVIDER NOT IN SYSTEM    Chief Complaint: CPE   History of Present Illness:  Melanie Rodriguez is a 29 y.o. very pleasant female patient who presents with the following: New patient here today with a couple of issues.  She had asked for a CPE but then we converted to an OV as she has several concerns today.   She has noted urinary frequency and pain with urination for about one week She noted right flank pain last night She has noted blood in her urine when she wipes only.   Never been told that she had a kidney stone in the past; however wonders if she might have a stone now.    She also has noted abdominal pain and bloating for about 2 weeks, will come and go.   No vaginal discharge.   No vomiting or diarrhea, no fever.    She has been fatigued for about 3 weeks. She is not aware of any change in her routine that could account for this.    She had a negative pap last year.  She has a mirena IUD   Patient Active Problem List   Diagnosis Date Noted  . Hemorrhagic ovarian cyst 10/03/2011    Past Medical History  Diagnosis Date  . Pelvic abscess     Past Surgical History  Procedure Laterality Date  . Appendectomy  10/12    s/p abscess w/drain -drain d/c 12/27  . Cystoscopy  10/24/2011    Procedure: CYSTOSCOPY;  Surgeon: Lindaann Slough, MD;  [facility]: [facility];  Service: Urology;  Laterality: Right;  cysto and stent placement  c arm    History  Substance Use Topics  . Smoking status: Never Smoker   . Smokeless tobacco: Never Used  . Alcohol Use: No    Family History  Problem Relation Age of Onset  . Diabetes Mother   . Cancer Father     No Known Allergies  Medication list has been reviewed and updated.  Current Outpatient Prescriptions on File Prior to  Visit  Medication Sig Dispense Refill  . amoxicillin (AMOXIL) 500 MG capsule Take 1 capsule (500 mg total) by mouth 3 (three) times daily. (Patient not taking: Reported on 12/24/2014) 30 capsule 0  . naproxen (NAPROSYN) 500 MG tablet Take 1 tablet (500 mg total) by mouth 2 (two) times daily with a meal. (Patient not taking: Reported on 12/24/2014) 60 tablet 2  . sulfamethoxazole-trimethoprim (BACTRIM DS) 800-160 MG per tablet Take 1 tablet by mouth 2 (two) times daily. (Patient not taking: Reported on 12/24/2014) 6 tablet 0   No current facility-administered medications on file prior to visit.    Review of Systems:  As per HPI- otherwise negative.   Physical Examination: Filed Vitals:   12/24/14 1548  BP: 134/74  Pulse: 81  Temp: 97.9 F (36.6 C)  Resp: 16   Filed Vitals:   12/24/14 1548  Height:  (1.575 m)  Weight: 161 lb 12.8 oz (73.392 kg)   Body mass index is 29.59 kg/(m^2). Ideal Body Weight: Weight in (lb) to have BMI = 25: 136.4  GEN: WDWN, NAD, Non-toxic, A & O x 3, overweight, looks well HEENT: Atraumatic, Normocephalic. Neck supple. No masses, No LAD.  Bilateral TM wnl, oropharynx normal.  PEERL,EOMI.   Ears and Nose: No external deformity. CV: RRR, No M/G/R. No JVD. No thrill. No extra heart sounds. PULM: CTA B, no wheezes, crackles, rhonchi. No retractions. No resp. distress. No accessory muscle use. ABD: S, NT, ND, +BS. No rebound. No HSM. EXTR: No c/c/e NEURO Normal gait.  PSYCH: Normally interactive. Conversant. Not depressed or anxious appearing.  Calm demeanor.  Gu: IUD strings are visible, No CMT. No adnexal masses or tenderness.  Normal vagina and vulva   Results for orders placed or performed in visit on 12/24/14  POCT CBC  Result Value Ref Range   WBC 9.4 4.6 - 10.2 K/uL   Lymph, poc 2.8 0.6 - 3.4   POC LYMPH PERCENT 29.8 10 - 50 %L   MID (cbc) 0.6 0 - 0.9   POC MID % 6.8 0 - 12 %M   POC Granulocyte 6.0 2 - 6.9   Granulocyte percent 63.4 37 - 80  %G   RBC 4.31 4.04 - 5.48 M/uL   Hemoglobin 12.2 12.2 - 16.2 g/dL   HCT, POC 16.136.5 (A) 09.637.7 - 47.9 %   MCV 84.7 80 - 97 fL   MCH, POC 28.4 27 - 31.2 pg   MCHC 33.6 31.8 - 35.4 g/dL   RDW, POC 04.514.5 %   Platelet Count, POC 252 142 - 424 K/uL   MPV 8.6 0 - 99.8 fL  POCT UA - Microscopic Only  Result Value Ref Range   WBC, Ur, HPF, POC 7-10    RBC, urine, microscopic 1-3    Bacteria, U Microscopic 1+    Mucus, UA neg    Epithelial cells, urine per micros 1-3    Crystals, Ur, HPF, POC neg    Casts, Ur, LPF, POC neg    Yeast, UA neg   POCT urinalysis dipstick  Result Value Ref Range   Color, UA yellow    Clarity, UA clear    Glucose, UA neg    Bilirubin, UA neg    Ketones, UA neg    Spec Grav, UA 1.020    Blood, UA trace-lysed    pH, UA 7.0    Protein, UA 30    Urobilinogen, UA 0.2    Nitrite, UA neg    Leukocytes, UA small (1+)   POCT urine pregnancy  Result Value Ref Range   Preg Test, Ur Negative      Assessment and Plan: Acute cystitis with hematuria - Plan: ciprofloxacin (CIPRO) 500 MG tablet  Dysuria - Plan: POCT UA - Microscopic Only, POCT urinalysis dipstick, Urine culture, GC/Chlamydia Probe Amp  Right flank pain - Plan: POCT CBC, POCT UA - Microscopic Only, POCT urinalysis dipstick, Urine culture  Other fatigue - Plan: POCT urine pregnancy, Comprehensive metabolic panel, TSH, HIV antibody (with reflex)  Here today with a few sx, most consistent with UTI. Await the rest of her labs, and will start treatment for UTI in the meantime.  Will plan follow-up visit for her CPE See patient instructions for more details.     Signed Abbe AmsterdamJessica Copland, MD

## 2014-12-25 LAB — GC/CHLAMYDIA PROBE AMP
CT PROBE, AMP APTIMA: NEGATIVE
GC PROBE AMP APTIMA: NEGATIVE

## 2014-12-25 LAB — COMPREHENSIVE METABOLIC PANEL
ALT: 33 U/L (ref 0–35)
AST: 30 U/L (ref 0–37)
Albumin: 3.7 g/dL (ref 3.5–5.2)
Alkaline Phosphatase: 75 U/L (ref 39–117)
BILIRUBIN TOTAL: 0.5 mg/dL (ref 0.2–1.2)
BUN: 9 mg/dL (ref 6–23)
CO2: 25 meq/L (ref 19–32)
CREATININE: 0.52 mg/dL (ref 0.50–1.10)
Calcium: 9 mg/dL (ref 8.4–10.5)
Chloride: 105 mEq/L (ref 96–112)
GLUCOSE: 108 mg/dL — AB (ref 70–99)
Potassium: 3.7 mEq/L (ref 3.5–5.3)
Sodium: 139 mEq/L (ref 135–145)
Total Protein: 7.5 g/dL (ref 6.0–8.3)

## 2014-12-25 LAB — URINE CULTURE
Colony Count: NO GROWTH
ORGANISM ID, BACTERIA: NO GROWTH

## 2014-12-25 LAB — HIV ANTIBODY (ROUTINE TESTING W REFLEX): HIV: NONREACTIVE

## 2014-12-25 LAB — TSH: TSH: 1.816 u[IU]/mL (ref 0.350–4.500)

## 2014-12-26 ENCOUNTER — Encounter: Payer: Self-pay | Admitting: Family Medicine

## 2014-12-27 ENCOUNTER — Encounter: Payer: Self-pay | Admitting: Family Medicine

## 2014-12-28 ENCOUNTER — Encounter: Payer: Self-pay | Admitting: Family Medicine

## 2015-09-02 ENCOUNTER — Ambulatory Visit: Payer: Self-pay

## 2015-09-16 ENCOUNTER — Ambulatory Visit: Payer: Self-pay | Attending: Family Medicine

## 2015-10-25 DIAGNOSIS — R739 Hyperglycemia, unspecified: Secondary | ICD-10-CM

## 2015-10-25 HISTORY — DX: Hyperglycemia, unspecified: R73.9

## 2015-10-28 ENCOUNTER — Emergency Department (HOSPITAL_COMMUNITY)
Admission: EM | Admit: 2015-10-28 | Discharge: 2015-10-28 | Disposition: A | Discharge status: Home / Self Care | Payer: No Typology Code available for payment source | Source: Home / Self Care | Attending: Family Medicine | Admitting: Family Medicine

## 2015-10-28 ENCOUNTER — Encounter (HOSPITAL_COMMUNITY): Payer: Self-pay | Admitting: *Deleted

## 2015-10-28 DIAGNOSIS — R7302 Impaired glucose tolerance (oral): Secondary | ICD-10-CM

## 2015-10-28 LAB — POCT I-STAT, CHEM 8
BUN: 11 mg/dL (ref 6–20)
Calcium, Ion: 1.2 mmol/L (ref 1.12–1.23)
Chloride: 101 mmol/L (ref 101–111)
Creatinine, Ser: 0.6 mg/dL (ref 0.44–1.00)
Glucose, Bld: 135 mg/dL — ABNORMAL HIGH (ref 65–99)
HEMATOCRIT: 42 % (ref 36.0–46.0)
Hemoglobin: 14.3 g/dL (ref 12.0–15.0)
Potassium: 3.6 mmol/L (ref 3.5–5.1)
SODIUM: 141 mmol/L (ref 135–145)
TCO2: 27 mmol/L (ref 0–100)

## 2015-10-28 NOTE — ED Provider Notes (Signed)
CSN: [id number]     Arrival date & time 10/28/15  1324 History   First MD Initiated Contact with Patient 10/28/15 1356     Chief Complaint  Patient presents with  . Blood Sugar Problem   (Consider location/radiation/quality/duration/timing/severity/associated sxs/prior Treatment) Patient is a 30 y.o. female presenting with hyperglycemia. The history is provided by the patient.  Hyperglycemia Severity:  Mild Onset quality:  Gradual Progression:  Unchanged Chronicity:  New Diabetes status:  Non-diabetic Associated symptoms: blurred vision, dizziness and increased thirst   Associated symptoms: no dysuria   Risk factors: family hx of diabetes     Past Medical History  Diagnosis Date  . Pelvic abscess    Past Surgical History  Procedure Laterality Date  . Appendectomy  10/12    s/p abscess w/drain -drain d/c 12/27  . Cystoscopy  10/24/2011    Procedure: CYSTOSCOPY;  Surgeon: Lindaann SloughMarc-Henry Nesi, MD;  [facility] [place];  Service: Urology;  Laterality: Right;  cysto and stent placement  c arm   Family History  Problem Relation Age of Onset  . Diabetes Mother   . Cancer Father    Social History  Substance Use Topics  . Smoking status: Never Smoker   . Smokeless tobacco: Never Used  . Alcohol Use: No   OB History    Gravida Para Term Preterm AB TAB SAB Ectopic Multiple Living   3 3 3       3      Review of Systems  Constitutional: Negative.   Eyes: Positive for blurred vision.  Cardiovascular: Negative.   Endocrine: Positive for polydipsia.  Genitourinary: Negative for dysuria.  Neurological: Positive for dizziness and headaches.  All other systems reviewed and are negative.   Allergies  Review of patient's allergies indicates no known allergies.  Home Medications   Prior to Admission medications   Medication Sig Start Date End Date Taking? Authorizing Provider  ciprofloxacin (CIPRO) 500 MG tablet Take 1 tablet (500 mg total) by mouth 2 (two) times  daily. 12/24/14   Pearline CablesJessica C Copland, MD   Meds Ordered and Administered this Visit  Medications - No data to display  BP 122/82 mmHg  Pulse 87  Temp(Src) 98.6 F (37 C) (Oral)  Resp 16  SpO2 100%  LMP 10/10/2015 No data found.   Physical Exam  Constitutional: She is oriented to person, place, and time. She appears well-developed and well-nourished. No distress.  Eyes: Pupils are equal, round, and reactive to light.  Cardiovascular: Normal heart sounds.   Neurological: She is alert and oriented to person, place, and time.  Skin: Skin is warm and dry.  Nursing note and vitals reviewed.   ED Course  Procedures (including critical care time)  Labs Review Labs Reviewed  POCT I-STAT, CHEM 8 - Abnormal; Notable for the following:    Glucose, Bld 135 (*)    All other components within normal limits    Imaging Review No results found.   Visual Acuity Review  Right Eye Distance:   Left Eye Distance:   Bilateral Distance:    Right Eye Near:   Left Eye Near:    Bilateral Near:         MDM   1. Glucose intolerance (impaired glucose tolerance)    Fasting bs 135, needs follow-up.    Linna HoffJames D Jens Siems, MD 10/28/15 [phone]

## 2015-10-28 NOTE — ED Notes (Signed)
Pt   Wants  To  Be  Checked  For  Diabetes   She  States  She  Has  Had  Episodes  Of  dizzyness   And   Reports    Some  Blurred  Vision  As   Well       Pt  States     Symptoms  X   Several  Months

## 2015-10-28 NOTE — Discharge Instructions (Signed)
You should see a doctor for recheck and treatment if indicated of elevated sugar. Your sugar today was 135.

## 2016-10-06 ENCOUNTER — Ambulatory Visit (INDEPENDENT_AMBULATORY_CARE_PROVIDER_SITE_OTHER): Payer: Self-pay | Admitting: Internal Medicine

## 2016-10-06 ENCOUNTER — Encounter: Payer: Self-pay | Admitting: Internal Medicine

## 2016-10-06 VITALS — BP 126/80 | HR 76 | Resp 12 | Ht 61.5 in | Wt 159.0 lb

## 2016-10-06 DIAGNOSIS — Z23 Encounter for immunization: Secondary | ICD-10-CM

## 2016-10-06 DIAGNOSIS — Z13220 Encounter for screening for lipoid disorders: Secondary | ICD-10-CM

## 2016-10-06 DIAGNOSIS — Z9189 Other specified personal risk factors, not elsewhere classified: Secondary | ICD-10-CM

## 2016-10-06 DIAGNOSIS — R739 Hyperglycemia, unspecified: Secondary | ICD-10-CM

## 2016-10-06 DIAGNOSIS — L2082 Flexural eczema: Secondary | ICD-10-CM

## 2016-10-06 LAB — GLUCOSE, POCT (MANUAL RESULT ENTRY): POC Glucose: 81 mg/dl (ref 70–99)

## 2016-10-06 MED ORDER — TRIAMCINOLONE ACETONIDE 0.1 % EX CREA: TOPICAL_CREAM | CUTANEOUS | 1 refills | Status: DC

## 2016-10-06 NOTE — Progress Notes (Signed)
Subjective:    Patient ID: Melanie Rodriguez, female    DOB: 05/07/1986, 30 y.o.   MRN: [id number]  HPI   Here to establish  1.  Wants a complete physical  2.  Worried about her sugar being a problem.  Her mother has DM.  Has had elevated sugar in the past as well--January 2017 sugar was 135 and she believes she was fasting then.  She was told to watch her diet and drink less in the way of sugary drinks.  She has made some changes. At times, is thirsty.   At times, describes polyuria. Drinks some sort of Herbalife tea with aloe and fiber.    Breakfast:  herbalife tea as above or with cinnamon.  May have Herbalife shake--soy protein.  She is not sure how much in way of carbohydrates are in the shake.    May have a piece of fruit in between breakfast and lunch, frequently without skin as has braces.  Lunch: Beans, large corn tortilla she makes herself, fresh cheese, salsa with [place]. Sprite or sweet tea.  Also makes a tea herself without the sugar.  Dinner:  Kirtland BouchardEnchiladas with corn tortilla, pollo with many vegetables:  Onions, tomatoes, lettuce, chiles, salsa and spoonful of sour cream, fresh cheese.  Lab Results  Component Value Date   POCGLU 81 10/06/2016    3.  Dry skin:  Which for some reason she relates as a symptom for her mother's DM and she has this as well.  Antecubital fossae and lower legs.  Has had this only in the past 2 years.  Has happened in warm weather.  Does have mild itchy eyes and sneezing in the summer.  She cannot say these symptoms of skin and upper airway occurred at same time.  No history of asthma.  No outpatient prescriptions have been marked as taking for the 10/06/16 encounter (Office Visit) with Julieanne MansonElizabeth Hedy Garro, MD.    No Known Allergies   Past Medical History:  Diagnosis Date  . Hyperglycemia 10/2015  . Pelvic abscess 07/2011   Complication of appendicitis--ultimately underwent percutaneous drainag and also had ureteral stent placed  temporarily due to obstruction from abscess in right pelvis    Past Surgical History:  Procedure Laterality Date  . APPENDECTOMY  08/12/2011   s/p abscess w/drain -drain d/c 12/27  . CYSTOSCOPY  10/24/2011   Procedure: CYSTOSCOPY;  Surgeon: Lindaann SloughMarc-Henry Nesi, MD;  [facility] ;  Service: Urology;  Laterality: Right;  cysto and stent placement c arm    Family History  Problem Relation Age of Onset  . Diabetes Mother   . Hearing loss Mother   . Cancer Father     lymphoma--not sure which type.   Social History   Social History  . Marital status: Single    Spouse name: N/A  . Number of children: 3  . Years of education: 10   Occupational History  . 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 Smoking status: Never Smoker  . Smokeless tobacco: Never Used  . Alcohol use No  . Drug use: No  . Sexual activity: Yes    Birth control/ protection: IUD     Comment: Mirena IUD removed June of 2017 due to menorrhagia.     Other Topics Concern  . Not on file   Social History Narrative   Originally from [place]   [place]   Lives at  home with her 3 sons and her parents, who are separated, but come to live with her family on and off.    Review of Systems     Objective:   Physical Exam NAD HEENT:  PERRL, EOMI, discs sharp, TMs pearly gray, throat without injection.  Braces on uneven teeth. Neck:  Supple, no adenopathy, no thyromegaly  Chest:  CTA CV:  RRR with normal S1 and S2, No S3, S4 or murmur, radial and DP pulses normal and equal Abd:  S, NT, No HSM, or masses, + BS Skin:  Dry patch in left antecubital fossa, also ovoid patch of dry skin righ lateral lower leg. LE:  No edema       Assessment & Plan:  1.  History of hyperglycemia:  Fine today--check A1C,  FLP as fasting today.    2.  Eczema:  Triamcinolone 0.1% cream twice daily to patches, Eucerin with eczema relief all over.  3.   Dental needs:  Referral to Dental.  Has braces, but not a regular dentist.  4.  Immunizations:  Tdap

## 2016-10-06 NOTE — Patient Instructions (Signed)
Dove Soap Avoid hot water with shower Eucerin cream with eczema relief twice daily after shower all over and one other time during the day

## 2016-10-07 LAB — LIPID PANEL W/O CHOL/HDL RATIO
CHOLESTEROL TOTAL: 182 mg/dL (ref 100–199)
HDL: 36 mg/dL — ABNORMAL LOW (ref >39)
LDL CALC: 77 mg/dL (ref 0–99)
Triglycerides: 346 mg/dL — ABNORMAL HIGH (ref 0–149)
VLDL Cholesterol Cal: 69 mg/dL — ABNORMAL HIGH (ref 5–40)

## 2016-10-07 LAB — HGB A1C W/O EAG: Hgb A1c MFr Bld: 5.3 % (ref 4.8–5.6)

## 2016-10-24 NOTE — L&D Delivery Note (Signed)
Patient is 31 y.o. [id number] 7175w4d who presents for IOL for IUGR and ICP . S/p Cytotec, CRB, pitocin, AROM. AROM clear at 0011.  Prenatal course also complicated by IUGR.  Delivery Note At 2:34 AM a viable female was delivered via Vaginal, Spontaneous Delivery (Presentation: LOA ).  APGAR: 7/9 ; weight  Pending at time of delivery .   Placenta status: Intact, delivered spontaneously with gentle traction, .  Cord: 3 vessel no complications Anesthesia:  Epidrual Episiotomy: None Lacerations: 1st degree Suture Repair: none Est. Blood Loss (mL): 100  Mom to postpartum.  Baby to Couplet care / Skin to Skin.  Denyse AmassCorey P Dondre Catalfamo 05/30/2017, 3:00 AM     Upon arrival patient was complete and pushing. She pushed with good maternal effort to deliver a viable female infant in cephalic, LOA position.  Nuchal cord tight,reduced after delivery of body. Baby delivered without difficulty, was noted to have good tone and place on maternal abdomen for oral suctioning, drying and stimulation. Delayed cord clamping performed. Placenta delivered spontaneously with gentle cord traction. Fundus firm with massage and Pitocin. Perineum inspected and found to have small 1 degree laceration, which was found to be hemostatic. Counts of sharps, instruments, and lap pads were all correct.

## 2016-12-01 LAB — OB RESULTS CONSOLE GC/CHLAMYDIA
CHLAMYDIA, DNA PROBE: NEGATIVE
Gonorrhea: NEGATIVE

## 2016-12-01 LAB — OB RESULTS CONSOLE ANTIBODY SCREEN: Antibody Screen: NEGATIVE

## 2016-12-01 LAB — OB RESULTS CONSOLE HEPATITIS B SURFACE ANTIGEN: HEP B S AG: NEGATIVE

## 2016-12-01 LAB — OB RESULTS CONSOLE RUBELLA ANTIBODY, IGM: Rubella: IMMUNE

## 2016-12-01 LAB — OB RESULTS CONSOLE HIV ANTIBODY (ROUTINE TESTING): HIV: NONREACTIVE

## 2016-12-01 LAB — OB RESULTS CONSOLE ABO/RH: RH Type: POSITIVE

## 2016-12-01 LAB — OB RESULTS CONSOLE RPR: RPR: NONREACTIVE

## 2017-01-05 ENCOUNTER — Ambulatory Visit (INDEPENDENT_AMBULATORY_CARE_PROVIDER_SITE_OTHER): Payer: Self-pay | Admitting: Internal Medicine

## 2017-01-05 ENCOUNTER — Encounter: Payer: Self-pay | Admitting: Internal Medicine

## 2017-01-05 VITALS — BP 112/78 | HR 80 | Temp 98.2°F | Resp 12 | Ht 62.0 in | Wt 153.0 lb

## 2017-01-05 DIAGNOSIS — J0100 Acute maxillary sinusitis, unspecified: Secondary | ICD-10-CM

## 2017-01-05 MED ORDER — AMOXICILLIN 500 MG PO CAPS: ORAL_CAPSULE | ORAL | 0 refills | Status: DC

## 2017-01-05 NOTE — Patient Instructions (Signed)
Nasal saline like  Ocean Mist nasal spray as needed also. Drink lots of water, applesauce pudding, popsicles, etc.

## 2017-01-05 NOTE — Progress Notes (Signed)
   Subjective:    Patient ID: Melanie Rodriguez, female    DOB: 04/10/1986, 31 y.o.   MRN: [id number]  HPI   Is [redacted] weeks pregnant--another boy! [facility]  Here today as she has throat pain.  Started with myalgias, chills, headache, sneezing, cough 2 weeks ago.   Was better, then 3 days ago, developed pain with swallowing, even saliva and has a lot of coughing at night.  Became hoarse during first day of illness.  Not sure, but thinks she has had fever.  Having some headaches at times.  No stomachache.  Lot of posterior pharyngeal drainage.  Coughing of greens mucous with streaks of blood.  No drainage from nose. Has been taking Tylenol 500 mg and Advil 400 mg:  Minimal help.    Current Meds  Medication Sig  . acetaminophen (TYLENOL) 500 MG tablet Take 500 mg by mouth every 6 (six) hours as needed.  . Prenatal Vit-Fe Fumarate-FA (PRENATAL PO) Take by mouth daily.   No Known Allergies     Review of Systems     Objective:   Physical Exam   NAD HEENT:  PERRL, EOMI, Tender over maxillary sinuses bilaterally, throat with mild injection or exudate.  TMs pearly gray. Neck:  Supple, No adenopathy, no thyromegaly Chest:  CTA CV:  RRR without murmur or rub. Abd:  S, NT, + BS     Assessment & Plan:  1.  Acute Sinusitis:  Amoxicillin 500 mg twice daily for 10 days.  Nasal saline.  To call if no improvement in next 4-5 days. Push fluids.

## 2017-05-22 LAB — OB RESULTS CONSOLE GBS: GBS: POSITIVE

## 2017-05-29 ENCOUNTER — Inpatient Hospital Stay (HOSPITAL_COMMUNITY)
Admission: AD | Admit: 2017-05-29 | Discharge: 2017-06-01 | DRG: 775 | Disposition: A | Discharge status: Home / Self Care | Payer: Medicaid Other | Source: Ambulatory Visit | Attending: Obstetrics & Gynecology | Admitting: Obstetrics & Gynecology

## 2017-05-29 ENCOUNTER — Encounter (HOSPITAL_COMMUNITY): Payer: Self-pay | Admitting: Obstetrics

## 2017-05-29 DIAGNOSIS — O99824 Streptococcus B carrier state complicating childbirth: Secondary | ICD-10-CM | POA: Diagnosis present

## 2017-05-29 DIAGNOSIS — Z3A37 37 weeks gestation of pregnancy: Secondary | ICD-10-CM

## 2017-05-29 DIAGNOSIS — O2662 Liver and biliary tract disorders in childbirth: Secondary | ICD-10-CM | POA: Diagnosis present

## 2017-05-29 DIAGNOSIS — O26619 Liver and biliary tract disorders in pregnancy, unspecified trimester: Secondary | ICD-10-CM

## 2017-05-29 DIAGNOSIS — K831 Obstruction of bile duct: Secondary | ICD-10-CM

## 2017-05-29 DIAGNOSIS — O365930 Maternal care for other known or suspected poor fetal growth, third trimester, not applicable or unspecified: Secondary | ICD-10-CM | POA: Diagnosis present

## 2017-05-29 DIAGNOSIS — O365990 Maternal care for other known or suspected poor fetal growth, unspecified trimester, not applicable or unspecified: Secondary | ICD-10-CM

## 2017-05-29 DIAGNOSIS — O26649 Intrahepatic cholestasis of pregnancy, unspecified trimester: Secondary | ICD-10-CM

## 2017-05-29 HISTORY — DX: Liver and biliary tract disorders in pregnancy, unspecified trimester: O26.619

## 2017-05-29 HISTORY — DX: Obstruction of bile duct: K83.1

## 2017-05-29 HISTORY — DX: Intrahepatic cholestasis of pregnancy, unspecified trimester: O26.649

## 2017-05-29 LAB — CBC
HEMATOCRIT: 33.3 % — AB (ref 36.0–46.0)
HEMOGLOBIN: 11.1 g/dL — AB (ref 12.0–15.0)
MCH: 27.5 pg (ref 26.0–34.0)
MCHC: 33.3 g/dL (ref 30.0–36.0)
MCV: 82.6 fL (ref 78.0–100.0)
Platelets: 227 10*3/uL (ref 150–400)
RBC: 4.03 MIL/uL (ref 3.87–5.11)
RDW: 14.5 % (ref 11.5–15.5)
WBC: 7.8 10*3/uL (ref 4.0–10.5)

## 2017-05-29 LAB — TYPE AND SCREEN
ABO/RH(D): A POS
ANTIBODY SCREEN: NEGATIVE

## 2017-05-29 LAB — ABO/RH: ABO/RH(D): A POS

## 2017-05-29 MED ORDER — OXYCODONE-ACETAMINOPHEN 5-325 MG PO TABS: 2.0000 | ORAL_TABLET | ORAL | Status: DC | PRN

## 2017-05-29 MED ORDER — TERBUTALINE SULFATE 1 MG/ML IJ SOLN
0.2500 mg | Freq: Once | INTRAMUSCULAR | Status: DC | PRN
  Filled 2017-05-29: qty 1

## 2017-05-29 MED ORDER — ONDANSETRON HCL 4 MG/2ML IJ SOLN: 4.0000 mg | Freq: Four times a day (QID) | INTRAMUSCULAR | Status: DC | PRN

## 2017-05-29 MED ORDER — FLEET ENEMA 7-19 GM/118ML RE ENEM: 1.0000 | ENEMA | RECTAL | Status: DC | PRN

## 2017-05-29 MED ORDER — OXYTOCIN 40 UNITS IN LACTATED RINGERS INFUSION - SIMPLE MED
2.5000 [IU]/h | INTRAVENOUS | Status: DC
  Filled 2017-05-29: qty 1000

## 2017-05-29 MED ORDER — PENICILLIN G POT IN DEXTROSE 60000 UNIT/ML IV SOLN: 3.0000 10*6.[IU] | INTRAVENOUS | Status: DC

## 2017-05-29 MED ORDER — LACTATED RINGERS IV SOLN: 500.0000 mL | INTRAVENOUS | Status: DC | PRN

## 2017-05-29 MED ORDER — LACTATED RINGERS IV SOLN
INTRAVENOUS | Status: DC
  Administered 2017-05-29: 20:00:00 via INTRAVENOUS

## 2017-05-29 MED ORDER — PENICILLIN G POT IN DEXTROSE 60000 UNIT/ML IV SOLN
3.0000 10*6.[IU] | INTRAVENOUS | Status: DC
  Administered 2017-05-30: 3 10*6.[IU] via INTRAVENOUS
  Filled 2017-05-29 (×4): qty 50

## 2017-05-29 MED ORDER — MISOPROSTOL 25 MCG QUARTER TABLET
25.0000 ug | ORAL_TABLET | ORAL | Status: DC | PRN
  Administered 2017-05-29: 25 ug via VAGINAL
  Filled 2017-05-29 (×2): qty 1

## 2017-05-29 MED ORDER — DEXTROSE 5 % IV SOLN
5.0000 10*6.[IU] | Freq: Once | INTRAVENOUS | Status: AC
  Administered 2017-05-29: 5 10*6.[IU] via INTRAVENOUS
  Filled 2017-05-29: qty 5

## 2017-05-29 MED ORDER — FENTANYL CITRATE (PF) 100 MCG/2ML IJ SOLN: 100.0000 ug | INTRAMUSCULAR | Status: DC | PRN

## 2017-05-29 MED ORDER — PENICILLIN G POTASSIUM 5000000 UNITS IJ SOLR: 5.0000 10*6.[IU] | Freq: Once | INTRAVENOUS | Status: DC

## 2017-05-29 MED ORDER — ACETAMINOPHEN 325 MG PO TABS: 650.0000 mg | ORAL_TABLET | ORAL | Status: DC | PRN

## 2017-05-29 MED ORDER — SOD CITRATE-CITRIC ACID 500-334 MG/5ML PO SOLN: 30.0000 mL | ORAL | Status: DC | PRN

## 2017-05-29 MED ORDER — OXYTOCIN BOLUS FROM INFUSION
500.0000 mL | Freq: Once | INTRAVENOUS | Status: AC
  Administered 2017-05-30: 500 mL via INTRAVENOUS

## 2017-05-29 MED ORDER — LIDOCAINE HCL (PF) 1 % IJ SOLN
30.0000 mL | INTRAMUSCULAR | Status: DC | PRN
  Filled 2017-05-29: qty 30

## 2017-05-29 MED ORDER — OXYCODONE-ACETAMINOPHEN 5-325 MG PO TABS: 1.0000 | ORAL_TABLET | ORAL | Status: DC | PRN

## 2017-05-29 NOTE — Progress Notes (Signed)
Labor Progress Note Melanie Rodriguez is a 31 y.o. [phone] at 1862w3d presented for IOL for ICP and IUGR S: Feeling contractions painfully. No medications requested yet.   O:  BP 117/65   Pulse 64   Temp 98.6 F (37 C) (Axillary)   Resp 18   Ht 5\' 2"  (1.575 m)   Wt 166 lb (75.3 kg)   LMP  (LMP Unknown)   BMI 30.36 kg/m  EFM: 120/Moderate/Accels no decels  CVE: Dilation: 2.5 Effacement (%): 30 Station: Ballotable Presentation: Vertex Exam by:: Dr. Talbert ForestShirley   A&Rodriguez: 31 y.o. [id number] 5962w3d who presented for IOL for ICP and IUGR #Labor: Progressing well. Continuing with pitocin titration #Pain: IV pain medication and epidural available. Planning on minimal medications  #FWB: Cat 1 #GBS positive. Planning to start Pen  #ICP Symptoms controlled. Continuing IOL  Melanie Rodriguez Melanie Bergthold, MD 9:22 PM

## 2017-05-29 NOTE — H&P (Signed)
LABOR ADMISSION HISTORY AND PHYSICAL  [name] is a 31 y.o. female [phone] with IUP at [redacted]w[redacted]d by [place] presenting for IOL for cholestasis of pregnancy and IUGR. Patient informed that interpretation services are available, and she declined, but said if she could not understand, she would request the interpreting services. She reports +FMs, No LOF, no VB, no blurry vision, no headaches, no peripheral edema, and no RUQ pain.  She plans on breast feeding. She requests Paraguard for birth control.  Dating: By [place] --->  Estimated Date of Delivery: 06/02/17   Prenatal History/Complications:  [facility] at [facility]  Past Medical History: Past Medical History:  Diagnosis Date  . Hyperglycemia 10/2015  . Pelvic abscess 07/2011   Complication of appendicitis--ultimately underwent percutaneous drainag and also had ureteral stent placed temporarily due to obstruction from abscess in right pelvis    Past Surgical History: Past Surgical History:  Procedure Laterality Date  . APPENDECTOMY  08/12/2011   s/p abscess w/drain -drain d/c 12/27  . CYSTOSCOPY  10/24/2011   Procedure: CYSTOSCOPY;  Surgeon: Lindaann Slough, MD;  Location: [facility];  Service: Urology;  Laterality: Right;  cysto and stent placement c arm    Obstetrical History: OB History    Gravida Para Term Preterm AB Living   4 3 3     3    SAB TAB Ectopic Multiple Live Births           3      Social History: Social History   Social History  . Marital status: Single    Spouse name: N/A  . Number of children: 3  . Years of education: 10   Occupational History  . 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 Smoking status: Never Smoker  . Smokeless tobacco: Never Used  . Alcohol use No  . Drug use: No  . Sexual activity: Yes    Birth control/ protection: IUD     Comment: Mirena IUD removed June of 2017 due to menorrhagia.     Other Topics Concern  . Not on file    Social History Narrative   Originally from [place].   [place]   Lives at home with her 3 sons and her parents, who are separated, but come to live with her family on and off.    Family History: Family History  Problem Relation Age of Onset  . Diabetes Mother   . Hearing loss Mother   . Cancer Father        lymphoma--not sure which type.    Allergies: No Known Allergies  Prescriptions Prior to Admission  Medication Sig Dispense Refill Last Dose  . acetaminophen (TYLENOL) 500 MG tablet Take 500 mg by mouth every 6 (six) hours as needed.   Taking  . amoxicillin (AMOXIL) 500 MG capsule 1 cap by mouth twice daily 20 capsule 0   . Prenatal Vit-Fe Fumarate-FA (PRENATAL PO) Take by mouth daily.   Taking     Review of Systems   All systems reviewed and negative except as stated in HPI  Blood pressure 117/65, pulse 64, temperature 98.6 F (37 C), temperature source Axillary, resp. rate 18, height 5\' 2"  (1.575 m), weight 75.3 kg (166 lb). General appearance: alert, cooperative and no distress Lungs: normal work of breathing Extremities: Homans sign is negative, no sign of DVT Presentation: cephalic Fetal monitoringBaseline: 120 bpm, Variability: Good {> 6 bpm), Accelerations: Reactive and  Decelerations: Absent Uterine activityFrequency: Every 4-6 minutes Dilation: 2.5 Effacement (%): 30 Station: Ballotable Exam by:: Dr. Talbert ForestShirley   Prenatal labs: ABO, Rh: A/Positive/-- (02/08 0000) Antibody: Negative (02/08 0000) Rubella: Immune RPR: Nonreactive (02/08 0000)  HBsAg: Negative (02/08 0000)  HIV: Non-reactive (02/08 0000)  GBS: Positive (07/30 0000)  GTT: Fasting- 71, 1 hr- 142, 2 hr- 120, 3 hr- 113  Prenatal Transfer Tool  Maternal Diabetes: No Genetic Screening: Normal Maternal Ultrasounds/Referrals: Normal Fetal Ultrasounds or other Referrals:  None Maternal Substance Abuse:  No Significant Maternal Medications:  None Significant Maternal Lab Results:  Lab values include: Group B Strep positive  No results found for this or any previous visit (from the past 24 hour(s)).  Patient Active Problem List   Diagnosis Date Noted  . Indication for care in labor or delivery 05/29/2017  . 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 IUGR (intrauterine growth restriction) affecting care of mother 05/29/2017  . Hyperglycemia 10/25/2015  . Hemorrhagic ovarian cyst 10/03/2011    Assessment: Melanie Rodriguez is a 31 y.o. [phone] at 7383w3d here for  IOL for cholestasis of pregnancy and IUGR.  #Labor: Induction protocol- start patient on cytotec 25 mcg vaginally #Pain: Epidural upon request  #FWB: Cat 1 #ID: GBS positive- PCN #MOF: breast #MOC:Paraguard #Circ:  No  [place] Shirley, DO Family Medicine Resident PGY-1  05/29/2017, 8:01 PM  OB FELLOW HISTORY AND PHYSICAL ATTESTATION  I have seen and examined this patient; I agree with above documentation in the resident's note.    Frederik PearJulie P Degele, MD OB Fellow 05/29/2017, 8:03 PM

## 2017-05-30 ENCOUNTER — Inpatient Hospital Stay (HOSPITAL_COMMUNITY): Payer: Medicaid Other | Admitting: Anesthesiology

## 2017-05-30 ENCOUNTER — Encounter (HOSPITAL_COMMUNITY): Payer: Self-pay | Admitting: *Deleted

## 2017-05-30 DIAGNOSIS — Z3A37 37 weeks gestation of pregnancy: Secondary | ICD-10-CM

## 2017-05-30 DIAGNOSIS — O365930 Maternal care for other known or suspected poor fetal growth, third trimester, not applicable or unspecified: Secondary | ICD-10-CM

## 2017-05-30 LAB — RPR: RPR Ser Ql: NONREACTIVE

## 2017-05-30 MED ORDER — IBUPROFEN 600 MG PO TABS
600.0000 mg | ORAL_TABLET | Freq: Four times a day (QID) | ORAL | Status: DC
  Administered 2017-05-30 – 2017-06-01 (×10): 600 mg via ORAL
  Filled 2017-05-30 (×10): qty 1

## 2017-05-30 MED ORDER — SODIUM CHLORIDE 0.9 % IV SOLN: 250.0000 mL | INTRAVENOUS | Status: DC | PRN

## 2017-05-30 MED ORDER — ONDANSETRON HCL 4 MG PO TABS: 4.0000 mg | ORAL_TABLET | ORAL | Status: DC | PRN

## 2017-05-30 MED ORDER — TETANUS-DIPHTH-ACELL PERTUSSIS 5-2.5-18.5 LF-MCG/0.5 IM SUSP: 0.5000 mL | Freq: Once | INTRAMUSCULAR | Status: DC

## 2017-05-30 MED ORDER — COCONUT OIL OIL: 1.0000 "application " | TOPICAL_OIL | Status: DC | PRN

## 2017-05-30 MED ORDER — SIMETHICONE 80 MG PO CHEW
80.0000 mg | CHEWABLE_TABLET | ORAL | Status: DC | PRN
Start: 2017-05-30 — End: 2017-06-01

## 2017-05-30 MED ORDER — DIBUCAINE 1 % RE OINT: 1.0000 "application " | TOPICAL_OINTMENT | RECTAL | Status: DC | PRN

## 2017-05-30 MED ORDER — DIPHENHYDRAMINE HCL 25 MG PO CAPS: 25.0000 mg | ORAL_CAPSULE | Freq: Four times a day (QID) | ORAL | Status: DC | PRN

## 2017-05-30 MED ORDER — LACTATED RINGERS IV SOLN
500.0000 mL | Freq: Once | INTRAVENOUS | Status: AC
  Administered 2017-05-30: 500 mL via INTRAVENOUS

## 2017-05-30 MED ORDER — DOCUSATE SODIUM 100 MG PO CAPS
100.0000 mg | ORAL_CAPSULE | Freq: Two times a day (BID) | ORAL | Status: DC
  Administered 2017-05-30 – 2017-06-01 (×4): 100 mg via ORAL
  Filled 2017-05-30 (×4): qty 1

## 2017-05-30 MED ORDER — OXYTOCIN 40 UNITS IN LACTATED RINGERS INFUSION - SIMPLE MED: 2.5000 [IU]/h | INTRAVENOUS | Status: DC | PRN

## 2017-05-30 MED ORDER — SODIUM CHLORIDE 0.9% FLUSH: 3.0000 mL | Freq: Two times a day (BID) | INTRAVENOUS | Status: DC

## 2017-05-30 MED ORDER — DIPHENHYDRAMINE HCL 50 MG/ML IJ SOLN: 12.5000 mg | INTRAMUSCULAR | Status: DC | PRN

## 2017-05-30 MED ORDER — SENNOSIDES-DOCUSATE SODIUM 8.6-50 MG PO TABS
2.0000 | ORAL_TABLET | ORAL | Status: DC
  Administered 2017-05-30 – 2017-05-31 (×2): 2 via ORAL
  Filled 2017-05-30 (×2): qty 2

## 2017-05-30 MED ORDER — ONDANSETRON HCL 4 MG/2ML IJ SOLN: 4.0000 mg | INTRAMUSCULAR | Status: DC | PRN

## 2017-05-30 MED ORDER — ACETAMINOPHEN 325 MG PO TABS
650.0000 mg | ORAL_TABLET | ORAL | Status: DC | PRN
  Administered 2017-05-30: 650 mg via ORAL
  Filled 2017-05-30: qty 2

## 2017-05-30 MED ORDER — PHENYLEPHRINE 40 MCG/ML (10ML) SYRINGE FOR IV PUSH (FOR BLOOD PRESSURE SUPPORT)
80.0000 ug | PREFILLED_SYRINGE | INTRAVENOUS | Status: DC | PRN
  Filled 2017-05-30: qty 10
  Filled 2017-05-30: qty 5

## 2017-05-30 MED ORDER — LIDOCAINE HCL (PF) 1 % IJ SOLN
INTRAMUSCULAR | Status: DC | PRN
  Administered 2017-05-30 (×2): 4 mL

## 2017-05-30 MED ORDER — SODIUM CHLORIDE 0.9% FLUSH: 3.0000 mL | INTRAVENOUS | Status: DC | PRN

## 2017-05-30 MED ORDER — MEASLES, MUMPS & RUBELLA VAC ~~LOC~~ INJ
0.5000 mL | INJECTION | Freq: Once | SUBCUTANEOUS | Status: DC
  Filled 2017-05-30: qty 0.5

## 2017-05-30 MED ORDER — OXYCODONE-ACETAMINOPHEN 5-325 MG PO TABS: 2.0000 | ORAL_TABLET | ORAL | Status: DC | PRN

## 2017-05-30 MED ORDER — WITCH HAZEL-GLYCERIN EX PADS: 1.0000 "application " | MEDICATED_PAD | CUTANEOUS | Status: DC | PRN

## 2017-05-30 MED ORDER — OXYCODONE-ACETAMINOPHEN 5-325 MG PO TABS: 1.0000 | ORAL_TABLET | ORAL | Status: DC | PRN

## 2017-05-30 MED ORDER — EPHEDRINE 5 MG/ML INJ
10.0000 mg | INTRAVENOUS | Status: DC | PRN
  Filled 2017-05-30: qty 2

## 2017-05-30 MED ORDER — MAGNESIUM HYDROXIDE 400 MG/5ML PO SUSP: 30.0000 mL | ORAL | Status: DC | PRN

## 2017-05-30 MED ORDER — PHENYLEPHRINE 40 MCG/ML (10ML) SYRINGE FOR IV PUSH (FOR BLOOD PRESSURE SUPPORT)
80.0000 ug | PREFILLED_SYRINGE | INTRAVENOUS | Status: DC | PRN
  Filled 2017-05-30: qty 5

## 2017-05-30 MED ORDER — FENTANYL 2.5 MCG/ML BUPIVACAINE 1/10 % EPIDURAL INFUSION (WH - ANES)
14.0000 mL/h | INTRAMUSCULAR | Status: DC | PRN
  Administered 2017-05-30: 14 mL/h via EPIDURAL
  Filled 2017-05-30: qty 100

## 2017-05-30 MED ORDER — FENTANYL 2.5 MCG/ML BUPIVACAINE 1/10 % EPIDURAL INFUSION (WH - ANES): 14.0000 mL/h | INTRAMUSCULAR | Status: DC | PRN

## 2017-05-30 MED ORDER — ZOLPIDEM TARTRATE 5 MG PO TABS
5.0000 mg | ORAL_TABLET | Freq: Every evening | ORAL | Status: DC | PRN
Start: 2017-05-30 — End: 2017-06-01

## 2017-05-30 MED ORDER — BENZOCAINE-MENTHOL 20-0.5 % EX AERO
1.0000 "application " | INHALATION_SPRAY | CUTANEOUS | Status: DC | PRN
  Administered 2017-05-30: 1 via TOPICAL
  Filled 2017-05-30: qty 56

## 2017-05-30 MED ORDER — PRENATAL MULTIVITAMIN CH
1.0000 | ORAL_TABLET | Freq: Every day | ORAL | Status: DC
  Administered 2017-05-30 – 2017-06-01 (×3): 1 via ORAL
  Filled 2017-05-30 (×3): qty 1

## 2017-05-30 NOTE — Progress Notes (Signed)
Patient wishes to order her own meals and stated that she will call us if needed. Eda H Royal Interpreter.

## 2017-05-30 NOTE — Discharge Instructions (Signed)
Instrucciones para la mamá sobre los cuidados en el hogar °(Home Care Instructions for Mom) °ACTIVIDAD °· Reanude sus actividades regulares de forma gradual. °· Descanse. Tome siestas cuando el bebé duerme. °· No levante objetos que pesen más de 10 libras (4,5 kg) hasta que el médico se lo autorice. °· Evite las actividades que demandan mucho esfuerzo y energía (que son extenuantes) hasta que el médico se lo autorice. Caminar a un ritmo tranquilo a moderado siempre es más seguro. °· Si tuvo un parto por cesárea: °? No pase la aspiradora, suba escaleras o conduzca un vehículo durante 4 o 6 semanas. °? Pídale a alguien que le brinde ayuda con las tareas domésticas hasta que pueda realizarlas por su cuenta. °? Haga ejercicios como se lo haya indicado el médico, si corresponde. ° °HEMORRAGIA VAGINAL °Probablemente continúe sangrando durante 4 o 6 semanas después del parto. Generalmente, la cantidad de sangre disminuye y el color se hace más claro con el transcurso del tiempo. Sin embargo, si usted está demasiado activa, el color de la sangre puede ser rojo brillante. Si necesita cambiarse la compresa higiénica en menos de una hora o tiene coágulos grandes: °· Permanezca acostada. °· Eleve los pies. °· Coloque compresas frías en la zona inferior del abdomen. °· Haga reposo. °· Comuníquese con su médico. °Si está amamantando, podría volver a tener su período entre las 8 semanas después del parto y el momento en que deje de amamantar. Si no está amamantando, volverá a tener su período 6 u 8 semanas después del parto. °CUIDADOS PERINEALES °La zona perineal o perineo, es la parte del cuerpo que se encuentra entre los muslos. Después del parto, esta zona necesita un cuidado especial. Siga las siguientes indicaciones como se lo haya indicado su médico. °· Tome baños de inmersión durante 15 o 20 minutos. °· Utilice apósitos o aerosoles analgésicos y cremas como se lo hayan indicado. °· No utilice tampones ni se haga duchas  vaginales hasta que el sangrado vaginal se haya detenido. °· Cada vez que vaya al baño: °? Use una botella perineal. °? Cámbiese el apósito. °? Use papel tisú en lugar de papel higiénico hasta que se cure la sutura. °· Haga ejercicios de Kegel todos los días. Los ejercicios Kegel ayudan a mantener los músculos que sostienen la vagina, la vejiga y los intestinos. Estos ejercicios se pueden realizar mientras está parada, sentada o acostada. Para hacer los ejercicios de Kegel: °? Tense los músculos del estómago y los que rodean el canal de parto. °? Mantenga esta posición durante unos segundos. °? Relájese. °? Repita hasta hacerlos 5 veces seguidas. °· Para evitar las hemorroides o que estas empeoren: °? Beba suficiente líquido para mantener la orina clara o de color amarillo pálido. °? Evite hacer fuerza al defecar. °? Tome los medicamentos y laxantes de venta libre como se lo haya indicado el médico. °CUIDADO DE LAS MAMAS °· Use un buen sostén. °· Evite tomar analgésicos de venta libre para las molestias de los pechos. °· Aplique hielo en los pechos para aliviar las molestias tanto como sea necesario: °? Ponga el hielo en una bolsa plástica. °? Coloque una toalla entre la piel y la bolsa de hielo. °? Aplique el hielo durante 20, o como se lo haya indicado el médico. ° °NUTRICIÓN °· Mantenga una dieta bien balanceada. °· No intente perder de peso rápidamente reduciendo el consumo de calorías. °· Tome sus vitaminas prenatales hasta el control de postparto o hasta que su médico se lo indique. ° °DEPRESIÓN POSTPARTO °  Puede sentir deseos de llorar sin motivo aparente y verse incapaz de enfrentarse a todos los cambios que implica tener un bebé. Este estado de ánimo se llama depresión postparto. La depresión postparto ocurre porque sus niveles hormonales sufren cambios después del parto. Si usted tiene depresión postparto, busque contención por parte de su pareja, sus amigos y su familia. Si la depresión no desaparece por  sí sola después de algunas semanas, concurra a su médico. °AUTOEXAMEN DE MAMAS °Realícese autoexámenes en el mismo momento cada mes. Si está amamantando, el mejor momento de controlar sus mamas es después de alimentar al bebé, cuando los pechos no están tan llenos. Si está amamantando y su período ya comenzó, controle sus mamas el día 5, 6 o 7 de su período. °Informe a su médico de cualquier protuberancia, bulto o secreción. Si está amamantando, las mamas normalmente tienen bultos. Esto es transitorio y no es un riesgo para la salud. °INTIMIDAD Y SEXUALIDAD °Debe evitar las relaciones sexuales durante al menos 3 o 4 semanas después del parto o hasta que el flujo de color rojo amarronado haya desaparecido completamente. Si no desea quedar embarazada nuevamente, use algún método anticonceptivo. Después del parto, puede quedar embarazada incluso si no ha tenido todavía el período. °SOLICITE ATENCIÓN MÉDICA SI: °· Se siente incapaz de controlar los cambios que implica tener un hijo y esos sentimientos no desaparecen después de algunas semanas. °· Detecta una protuberancia, bulto o secreción en sus mamas. ° °SOLICITE ATENCIÓN MÉDICA DE INMEDIATO SI: °· Debe cambiarse la compresa higiénica en 1 hora o menos. °· Tiene los siguientes síntomas: °? Dolor intenso o calambres en la parte inferior del abdomen. °? Una secreción vaginal con mal olor. °? Fiebre que no se alivia con los medicamentos. °? Una zona de la mama se pone roja y le causa dolor, y además usted tiene fiebre. °? Una pantorrilla enrojecida y con dolor. °? Repentino e intenso dolor en el pecho. °? Falta de aire. °? Micción dolorosa o con sangre. °? Problemas visuales. °· Vómitos durante 12 horas o más. °· Dolor de cabeza intenso. °· Tiene pensamientos serios acerca de lastimarse a usted misma o dañar al niño o a otra persona. ° °Esta información no tiene como fin reemplazar el consejo del médico. Asegúrese de hacerle al médico cualquier pregunta que  tenga. °Document Released: 10/10/2005 Document Revised: 02/01/2016 Document Reviewed: 04/13/2015 °Elsevier Interactive Patient Education © 2017 [employer] ° °

## 2017-05-30 NOTE — Lactation Note (Addendum)
This note was copied from a baby's chart. Lactation Consultation Note  Patient Name: Boy Cathi RoanLaura Botello Montelongo ZOXWR'UToday's Date: 05/30/2017 mom is and experienced breastfeeding mother 3 -  2-6 months.   baby is 6112 hours old, and has been to the breast last at 1400 for 15 mins per mom and 5 x;s  Prior to that 10 -30 mins.  LC discussed with mom while the 3-11p Spanish interpreter present the potential feeding behaviors  Of a Early term infant. Nutritive vs non - nutritive feeding patterns and to watch for hanging out at the  Breast latched, body signal of being hungry and full.  LC asked mom to call with feeding cues for feeding assess.  Mother informed of post-discharge support and given phone number to the lactation department, including services for phone call assistance; out-patient appointments; and breastfeeding support group. List of other breastfeeding resources in the community given in the handout. Encouraged mother to call for problems or concerns related to breastfeeding.   Mom aware to call for feeding assessment, and mentioned to mom either RN or LC can assess baby feeding.  Will be followed tomorrow.    Maternal Data    Feeding Feeding Type: Breast Fed Length of feed: 15 min (per mom , )  LATCH Score                   Interventions    Lactation Tools Discussed/Used     Consult Status      Matilde SprangMargaret Ann Florean Hoobler 05/30/2017, 3:33 PM

## 2017-05-30 NOTE — Progress Notes (Signed)
RN reviewed admission paperwork and book with patient with Eda Royal the spanish interpreter.  Patient voiced understanding and will request interpreter when needed.

## 2017-05-30 NOTE — Anesthesia Preprocedure Evaluation (Signed)
Anesthesia Evaluation  Patient identified by MRN, date of birth, ID band Patient awake    Reviewed: Allergy & Precautions, H&P , NPO status , Patient's Chart, lab work & pertinent test results  Airway Mallampati: II  TM Distance: >3 FB Neck ROM: Full    Dental no notable dental hx.    Pulmonary neg pulmonary ROS,    Pulmonary exam normal breath sounds clear to auscultation       Cardiovascular negative cardio ROS Normal cardiovascular exam Rhythm:Regular Rate:Normal     Neuro/Psych negative neurological ROS  negative psych ROS   GI/Hepatic negative GI ROS, Neg liver ROS,   Endo/Other  negative endocrine ROS  Renal/GU negative Renal ROS     Musculoskeletal negative musculoskeletal ROS (+)   Abdominal   Peds  Hematology negative hematology ROS (+)   Anesthesia Other Findings   Reproductive/Obstetrics (+) Pregnancy                            Anesthesia Physical  Anesthesia Plan  ASA: II  Anesthesia Plan: Epidural   Post-op Pain Management:    Induction:   PONV Risk Score and Plan:   Airway Management Planned:   Additional Equipment:   Intra-op Plan:   Post-operative Plan:   Informed Consent: I have reviewed the patients History and Physical, chart, labs and discussed the procedure including the risks, benefits and alternatives for the proposed anesthesia with the patient or authorized representative who has indicated his/her understanding and acceptance.     Plan Discussed with:   Anesthesia Plan Comments:         Anesthesia Quick Evaluation  

## 2017-05-30 NOTE — Anesthesia Procedure Notes (Signed)
Epidural Patient location during procedure: OB  Staffing Anesthesiologist: Latisha Lasch Performed: anesthesiologist   Preanesthetic Checklist Completed: patient identified, pre-op evaluation, timeout performed, IV checked, risks and benefits discussed and monitors and equipment checked  Epidural Patient position: sitting Prep: site prepped and draped and DuraPrep Patient monitoring: heart rate, continuous pulse ox and blood pressure Approach: midline Location: L2-L3 Injection technique: LOR air and LOR saline  Needle:  Needle type: Tuohy  Needle gauge: 17 G Needle length: 9 cm Needle insertion depth: 5 cm Catheter type: closed end flexible Catheter size: 19 Gauge Catheter at skin depth: 10 cm Test dose: negative  Assessment Sensory level: T8 Events: blood not aspirated, injection not painful, no injection resistance, negative IV test and no paresthesia  Additional Notes Reason for block:procedure for pain     

## 2017-05-30 NOTE — Anesthesia Postprocedure Evaluation (Signed)
Anesthesia Post Note  Patient: Vicente MassonLaura Botello Montelongo  Procedure(s) Performed: * No procedures listed *     Patient location during evaluation: Mother Baby Anesthesia Type: Epidural Level of consciousness: awake, awake and alert and oriented Pain management: pain level controlled Vital Signs Assessment: post-procedure vital signs reviewed and stable Respiratory status: spontaneous breathing, nonlabored ventilation and respiratory function stable Cardiovascular status: stable Postop Assessment: no headache, no backache, patient able to bend at knees, no signs of nausea or vomiting and adequate PO intake Anesthetic complications: no    Last Vitals:  Vitals:   05/30/17 0454 05/30/17 0557  BP: 116/61 116/63  Pulse: (!) 55 66  Resp: 16 16  Temp:  36.8 C    Last Pain:  Vitals:   05/30/17 0748  TempSrc:   PainSc: 0-No pain   Pain Goal: Patients Stated Pain Goal: 3 (05/30/17 0205)               Trell Secrist

## 2017-05-31 MED ORDER — MENTHOL 3 MG MT LOZG
1.0000 | LOZENGE | OROMUCOSAL | Status: DC | PRN
  Administered 2017-05-31: 3 mg via ORAL
  Filled 2017-05-31: qty 9

## 2017-05-31 NOTE — Progress Notes (Signed)
(  [facility] speaker phone)  Post Partum Day #1 Subjective: no complaints, up ad lib and tolerating PO; breastfeeding going well; plans on Paragard IUD pp   Objective: Blood pressure (!) 103/54, pulse 65, temperature 98.4 F (36.9 C), resp. rate 18, height 5\' 2"  (1.575 m), weight 75.3 kg (166 lb), unknown if currently breastfeeding.  Physical Exam:  General: alert, cooperative and no distress Lochia: appropriate Uterine Fundus: firm DVT Evaluation: No evidence of DVT seen on physical exam.   Recent Labs  05/29/17 1935  HGB 11.1*  HCT 33.3*    Assessment/Plan: Plan for discharge tomorrow   LOS: 2 days   Cam HaiSHAW, KIMBERLY CNM 05/31/2017, 7:32 AM

## 2017-06-01 DIAGNOSIS — Z3A37 37 weeks gestation of pregnancy: Secondary | ICD-10-CM

## 2017-06-01 DIAGNOSIS — O365930 Maternal care for other known or suspected poor fetal growth, third trimester, not applicable or unspecified: Secondary | ICD-10-CM

## 2017-06-01 MED ORDER — DOCUSATE SODIUM 100 MG PO CAPS: 100.0000 mg | ORAL_CAPSULE | Freq: Two times a day (BID) | ORAL | 0 refills | Status: DC

## 2017-06-01 MED ORDER — IBUPROFEN 600 MG PO TABS: 600.0000 mg | ORAL_TABLET | Freq: Four times a day (QID) | ORAL | 0 refills | Status: DC

## 2017-06-01 NOTE — Lactation Note (Signed)
This note was copied from a baby's chart. Lactation Consultation Note experienced BF mom's breast engorged. DEBP given to mom to pump. Mom BF to Lt. Breast, pumped to Rt. LC assisted in beast massage during pumping and BF. Assisted in football. Noted some softening. ICE packs given to mom, laid back in bed. Encouraged mom to pump at 0400 to empty breast more.  Baby syring finger fed colostrum 15 ml. Baby spit a little up after feeding.  Offered interpreter, mom stated she was ok and understood. Mom done good teach back to make sure of clarity of instructions and teaching. Encouraged mom to ask for interpreter at any time she didn't understand LC.  Reported to RN of consult. Encouraged mom to give supplement after every BF.  Patient Name: Boy Cathi RoanLaura Botello Montelongo ZHYQM'VToday's Date: 06/01/2017 Reason for consult: Follow-up assessment;Infant < 6lbs   Maternal Data    Feeding Feeding Type: Breast Milk Length of feed: 25 min  LATCH Score Latch: Grasps breast easily, tongue down, lips flanged, rhythmical sucking.  Audible Swallowing: Spontaneous and intermittent  Type of Nipple: Everted at rest and after stimulation  Comfort (Breast/Nipple): Soft / non-tender  Hold (Positioning): Assistance needed to correctly position infant at breast and maintain latch.  LATCH Score: 9  Interventions Interventions: Breast feeding basics reviewed;Support pillows;Assisted with latch;Position options;Expressed milk;Breast massage;Hand express;Breast compression;Adjust position;Ice;DEBP  Lactation Tools Discussed/Used Tools: Pump Breast pump type: Double-Electric Breast Pump   Consult Status Consult Status: Follow-up Date: 06/01/17 Follow-up type: In-patient    Charyl DancerCARVER, Savayah G 06/01/2017, 3:49 AM

## 2017-06-01 NOTE — Discharge Summary (Signed)
OB Discharge Summary     Patient Name: Melanie Rodriguez DOB: 11/09/1985 MRN: [id number]  Date of admission: 05/29/2017 Delivering MD: John GiovanniOX, COREY P   Date of discharge: 06/01/2017  Admitting diagnosis: induction Intrauterine pregnancy: 6669w4d     Secondary diagnosis:  Active Problems:   Indication for care in labor or delivery   Cholestasis of pregnancy   IUGR (intrauterine growth restriction) affecting care of mother      Discharge diagnosis: Term Pregnancy Delivered                                                                                                Post partum procedures:None  Augmentation: AROM, Pitocin, Cytotec and Foley Balloon  Complications: None  Hospital course:  Induction of Labor With Vaginal Delivery   31 y.o. yo [id number] at 339w4d was admitted to the hospital 05/29/2017 for induction of labor.  Indication for induction: ICP, IUGR.  Patient had an uncomplicated labor course as follows: Membrane Rupture Time/Date: 12:11 AM ,05/30/2017   Intrapartum Procedures: Episiotomy: None [1]                                         Lacerations:  1st degree [2]  Patient had delivery of a Viable infant.  Information for the patient's newborn:  Melanie Rodriguez, Melanie Rodriguez [[id number]][[id number]]  Delivery Method: Vaginal, Spontaneous Delivery (Filed from Delivery Summary)   05/30/2017  Details of delivery can be found in separate delivery note.  Patient had a routine postpartum course. Patient is discharged home 06/01/17.  Physical exam  Vitals:   05/30/17 1800 05/31/17 0544 05/31/17 1756 06/01/17 0516  BP: 118/66 (!) 103/54 134/70 124/69  Pulse: 63 65 (!) 57 (!) 58  Resp: 16 18 17 18   Temp: 98.4 F (36.9 C) 98.4 F (36.9 C) 98.5 F (36.9 C)   TempSrc:   Oral   Weight:      Height:       General: alert, cooperative and no distress Lochia: appropriate Uterine Fundus: firm Incision: N/A DVT Evaluation: No evidence of DVT seen on physical exam. Labs: Lab Results   Component Value Date   WBC 7.8 05/29/2017   HGB 11.1 (L) 05/29/2017   HCT 33.3 (L) 05/29/2017   MCV 82.6 05/29/2017   PLT 227 05/29/2017   CMP Latest Ref Rng & Units 10/28/2015  Glucose 65 - 99 mg/dL 130(Q135(H)  BUN 6 - 20 mg/dL 11  Creatinine 6.570.44 - 8.461.00 mg/dL 9.620.60  Sodium 952135 - 841145 mmol/L 141  Potassium 3.5 - 5.1 mmol/L 3.6  Chloride 101 - 111 mmol/L 101  CO2 19 - 32 mEq/L -  Calcium 8.4 - 10.5 mg/dL -  Total Protein 6.0 - 8.3 g/dL -  Total Bilirubin 0.2 - 1.2 mg/dL -  Alkaline Phos 39 - 324117 U/L -  AST 0 - 37 U/L -  ALT 0 - 35 U/L -    Discharge instruction: per After Visit Summary and "Baby and Me Booklet".  After visit meds:  Allergies as of 06/01/2017   No Known Allergies     Medication List    TAKE these medications   docusate sodium 100 MG capsule Commonly known as:  COLACE Take 1 capsule (100 mg total) by mouth 2 (two) times daily.   ibuprofen 600 MG tablet Commonly known as:  ADVIL,MOTRIN Take 1 tablet (600 mg total) by mouth every 6 (six) hours.       Diet: routine diet  Activity: Advance as tolerated. Pelvic rest for 6 weeks.   Outpatient follow up:6 weeks Follow up Appt:No future appointments. Follow up Visit:No Follow-up on file.  Postpartum contraception: IUD Paragard  Newborn Data: Live born female  Birth Weight: 5 lb 5 oz (2410 g) APGAR: 7, 9  Baby Feeding: Breast Disposition:home with mother,Spanish Speaking. Interpreter contacted, but declined to be present for interview   06/01/2017 John Giovanni, MD  OB FELLOW DISCHARGE ATTESTATION  I have seen and examined this patient and agree with above documentation in the resident's note.   Frederik Pear, MD OB Fellow

## 2017-06-01 NOTE — Lactation Note (Addendum)
This note was copied from a baby's chart. Lactation Consultation Note  Patient Name: Melanie Rodriguez Cathi RoanLaura Botello Montelongo ZOXWR'UToday's Date: 06/01/2017 Reason for consult: Follow-up assessment;Infant weight loss;Early term 37-38.6wks;Other (Comment) (6 % weight loss , resolving engorgement )  Baby is 57 hours old , and has been exclusively breast fed. And moms milk is in , resolving engorgement.  Today discussed with mom due tot he baby being at 6% weight loss and below 5 pounds the feeding plan will change  Until the weight increases over 6 pounds. LC explained to mom to feed the baby on the 1st breast 15 - 20 mins , and then  Supplement with EBM from a bottle , start at 15 ml and increase to 30 ml . Post pump both breast for 10 -15 mins , save milk for the next feeding. And the next feeding switch the breast to latch and follow the same regimen.  LC discussed with mom due to the baby being early term infant, and less than 5 pounds , 6 % weight loss it was very important  To conserve the baby's energy, and the supplementing after feeding would be easy the calories the baby doesn't have to work hard for. Therefore the energy and weight would be enhanced steadily.  LC called WIC and asked the supervisor to have peer support to call mom to set up an appt. [facility].  [facility] and mom accepted offer , $30.00 cash obtained.  LC [facility] on the [facility] DEBP. LC also had observed mom pumping both breast while the LC was showing her how to PACE feed. The #24 Flanges appeared to be slightly snug. Per mom comfortable and mom continue to pump with the #24 Flanges . Reminded mom if the #24 Flange is uncomfortable to use the #27.  Sore nipple and engorgement prevention and tx reviewed.  Mom has shells if needed.  [facility] On Wednesday 8/15 at 10:00 - mom requested time and it was placed in  The appt. Basket for the Clinic.  LC praised mom for her  exclusive breast feeding and focused on the positives and mom aware of the above LC Plan.  Mother informed of post-discharge support and given phone number to the lactation department, including services for phone call assistance; out-patient appointments; and breastfeeding support group. List of other breastfeeding resources in the community given in the handout. Encouraged mother to call for problems or concerns related to breastfeeding.   Maternal Data Has patient been taught Hand Expression?: Yes  Feeding Feeding Type: Breast Milk Length of feed: 15 min  LATCH Score Latch: Grasps breast easily, tongue down, lips flanged, rhythmical sucking.  Audible Swallowing: Spontaneous and intermittent  Type of Nipple: Everted at rest and after stimulation  Comfort (Breast/Nipple): Filling, red/small blisters or bruises, mild/mod discomfort  Hold (Positioning): No assistance needed to correctly position infant at breast.  LATCH Score: 9  Interventions Interventions: Breast feeding basics reviewed;Skin to skin  Lactation Tools Discussed/Used Tools: Shells;Pump;Flanges Flange Size: 24;27 Shell Type: Inverted Breast pump type: Double-Electric Breast Pump;Manual WIC Program: Yes (LC [facility] up [facility]t. )   Consult Status Consult Status: Follow-up (also [facility] for [facility] ) Date: 06/07/17 [facility] requested 10:00 am via EPIC basket with clinic )    Matilde SprangMargaret Ann Caileb Rhue 06/01/2017, 11:37 AM

## 2017-06-01 NOTE — Lactation Note (Signed)
This note was copied from a baby's chart. Lactation Consultation Note  Patient Name: Melanie Rodriguez WUJWJ'XToday's Date: 06/01/2017  Due to Epic going down at 2:45 pm , unable to chart in the computer and see the paper progress note in the dyad's chart.   Maternal Data    Feeding Feeding Type: Breast Fed  LATCH Score                   Interventions    Lactation Tools Discussed/Used     Consult Status      Matilde SprangMargaret Ann Jeroline Wolbert 06/01/2017, 7:36 AM

## 2017-08-07 ENCOUNTER — Ambulatory Visit (INDEPENDENT_AMBULATORY_CARE_PROVIDER_SITE_OTHER): Payer: Self-pay | Admitting: Internal Medicine

## 2017-08-07 ENCOUNTER — Encounter: Payer: Self-pay | Admitting: Internal Medicine

## 2017-08-07 VITALS — BP 136/88 | HR 70 | Resp 12 | Ht 62.0 in | Wt 160.0 lb

## 2017-08-07 DIAGNOSIS — R030 Elevated blood-pressure reading, without diagnosis of hypertension: Secondary | ICD-10-CM

## 2017-08-07 DIAGNOSIS — K0889 Other specified disorders of teeth and supporting structures: Secondary | ICD-10-CM

## 2017-08-07 NOTE — Progress Notes (Signed)
   Subjective:    Patient ID: Melanie Rodriguez, female    DOB: [birth year], 31 y.o.   MRN: [id number]  HPI  Right upper tooth pain.  Has been told she needs a crown on two teeth next to each other--reportedly cracked.  Goes to an orthodontist in [place], but to have a crown put on, will cost her $1,000 with them.  She currently has braces.  States her brother helped her with the cost.   Has a 34 month old infant, Simonne Come, now.  Taking Tylenol as needed for pain   No outpatient prescriptions have been marked as taking for the 08/07/17 encounter (Office Visit) with Julieanne Manson, MD.    No Known Allergies   Review of Systems     Objective:   Physical Exam   NAD HEENT:  PERRL, EOMI, TMs pearly gray.  Has braces--difficult to see any breakage in teeth--has a large filling in one of the teeth.  No surrouding erythema. Neck:  Supple, No adenopathy Chest:  CTA CV:  RRR without murmur or rub, radial pulses normal and equal        Assessment & Plan:  At risk for dental loss/dental pain:  Referral to dental clinic to assess for placement of crowns.  BP elevated initially, but dropped to acceptable level before discharge.

## 2020-07-07 ENCOUNTER — Other Ambulatory Visit: Payer: Self-pay

## 2020-07-07 DIAGNOSIS — Z20822 Contact with and (suspected) exposure to covid-19: Secondary | ICD-10-CM

## 2020-07-09 LAB — NOVEL CORONAVIRUS, NAA: SARS-CoV-2, NAA: NOT DETECTED

## 2020-07-09 LAB — SARS-COV-2, NAA 2 DAY TAT

## 2020-07-27 ENCOUNTER — Other Ambulatory Visit: Payer: Self-pay

## 2020-07-27 ENCOUNTER — Emergency Department (HOSPITAL_COMMUNITY)
Admission: EM | Admit: 2020-07-27 | Discharge: 2020-07-27 | Disposition: A | Discharge status: Home / Self Care | Payer: Self-pay | Attending: Emergency Medicine | Admitting: Emergency Medicine

## 2020-07-27 ENCOUNTER — Encounter (HOSPITAL_COMMUNITY): Payer: Self-pay

## 2020-07-27 DIAGNOSIS — L03314 Cellulitis of groin: Secondary | ICD-10-CM | POA: Insufficient documentation

## 2020-07-27 MED ORDER — CLINDAMYCIN HCL 300 MG PO CAPS
450.0000 mg | ORAL_CAPSULE | Freq: Once | ORAL | Status: AC
  Administered 2020-07-27: 17:00:00 450 mg via ORAL
  Filled 2020-07-27: qty 1

## 2020-07-27 MED ORDER — CLINDAMYCIN HCL 150 MG PO CAPS
450.0000 mg | ORAL_CAPSULE | Freq: Three times a day (TID) | ORAL | 0 refills | Status: AC
Start: 2020-07-27 — End: 2020-08-03

## 2020-07-27 NOTE — Discharge Instructions (Addendum)
-  Prescription has been sent to your pharmacy for clindamycin.  This is used to treat skin infections and cellulitis.  Take as prescribed.  Pick up the prescription you should ask the pharmacist if they recommend a probiotic.  You can also take you for this medicine.  -You can take ibuprofen as needed for pain and swelling.  Take as prescribed.  Take with food so does not cause an upset stomach.  Return to the emergency department if you have any fever, chills, worsening pain or if the redness starts streaking or extends outside of the area marked.  Follow-up with the women's clinic or your primary care doctor for wound check in 2 days.

## 2020-07-27 NOTE — ED Triage Notes (Signed)
Patient c/o right groin swelling x 4 days.  Patient denies any N/v/D. patient states pain radiates into the right thigh.

## 2020-07-27 NOTE — ED Provider Notes (Signed)
[facility]-EMERGENCY DEPT Provider Note   CSN: [id number] Arrival date & time: 07/27/20  1417     History Chief Complaint  Patient presents with  . Groin Swelling    Melanie Rodriguez is a 34 y.o. female with past medical history significant for hyperglycemia and pelvic abscess.  Abdominal surgical history includes appendectomy.  HPI Patient presents to emergency department today with chief complaint of right groin swelling x4 days.  Patient states she first noticed a small bump after shaving.  It has progressively gotten bigger and is causing a throbbing pain and the pain radiates to her thigh.   She rates pain 8 of 10 in severity.  She has been taking Tylenol with minimal symptom improvement.  She denies any purulent drainage. She denies any fever, chills, abdominal pain, nausea, emesis, pelvic pain, vaginal discharge, vaginal bleeding.        Past Medical History:  Diagnosis Date  . Hyperglycemia 10/2015  . Pelvic abscess 07/2011   Complication of appendicitis--ultimately underwent percutaneous drainag and also had ureteral stent placed temporarily due to obstruction from abscess in right pelvis    Patient Active Problem List   Diagnosis Date Noted  . Indication for care in labor or delivery 05/29/2017  . 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 IUGR (intrauterine growth restriction) affecting care of mother 05/29/2017  . Hyperglycemia 10/25/2015  . Hemorrhagic ovarian cyst 10/03/2011    Past Surgical History:  Procedure Laterality Date  . APPENDECTOMY  08/12/2011   s/p abscess w/drain -drain d/c 12/27  . CYSTOSCOPY  10/24/2011   Procedure: CYSTOSCOPY;  Surgeon: Lindaann Slough, MD;  Location: [facility];  Service: Urology;  Laterality: Right;  cysto and stent placement c arm     OB History    Gravida  4   Para  4   Term  4   Preterm      AB      Living  4     SAB      TAB      Ectopic      Multiple  0     Live Births  4           Family History  Problem Relation Age of Onset  . Diabetes Mother   . Hearing loss Mother   . Cancer Father        lymphoma--not sure which type.    Social History   Tobacco Use  . Smoking status: Never Smoker  . Smokeless tobacco: Never Used  Vaping Use  . Vaping Use: Never used  Substance Use Topics  . Alcohol use: No  . Drug use: No    Home Medications Prior to Admission medications   Medication Sig Start Date End Date Taking? Authorizing Provider  clindamycin (CLEOCIN) 150 MG capsule Take 3 capsules (450 mg total) by mouth every 8 (eight) hours for 7 days. 07/27/20 08/03/20  Algie Cales E, PA-C  docusate sodium (COLACE) 100 MG capsule Take 1 capsule (100 mg total) by mouth 2 (two) times daily. Patient not taking: Reported on 08/07/2017 06/01/17   Cox, Mariann Laster, MD  ibuprofen (ADVIL,MOTRIN) 600 MG tablet Take 1 tablet (600 mg total) by mouth every 6 (six) hours. Patient not taking: Reported on 08/07/2017 06/01/17   Cox, Mariann Laster, MD    Allergies    Patient has no known allergies.  Review of Systems   Review of Systems  All other systems are reviewed and are negative for  acute change except as noted in the HPI.   Physical Exam Updated Vital Signs BP 127/84 (BP Location: Left Arm)   Pulse 88   Temp 98.6 F (37 C) (Oral)   Resp 16   Ht 5\' 5"  (1.651 m)   Wt 73.9 kg   LMP 07/27/2020   SpO2 100%   BMI 27.12 kg/m   Physical Exam Vitals and nursing note reviewed.  Constitutional:      General: She is not in acute distress.    Appearance: She is not ill-appearing.  HENT:     Head: Normocephalic and atraumatic.     Right Ear: Tympanic membrane and external ear normal.     Left Ear: Tympanic membrane and external ear normal.     Nose: Nose normal.     Mouth/Throat:     Mouth: Mucous membranes are moist.     Pharynx: Oropharynx is clear.  Eyes:     General: No scleral icterus.       Right eye: No discharge.        Left eye:  No discharge.     Extraocular Movements: Extraocular movements intact.     Conjunctiva/sclera: Conjunctivae normal.     Pupils: Pupils are equal, round, and reactive to light.  Neck:     Vascular: No JVD.  Cardiovascular:     Rate and Rhythm: Normal rate and regular rhythm.     Pulses: Normal pulses.          Radial pulses are 2+ on the right side and 2+ on the left side.     Heart sounds: Normal heart sounds.  Pulmonary:     Comments: Lungs clear to auscultation in all fields. Symmetric chest rise. No wheezing, rales, or rhonchi. Abdominal:     Comments: Abdomen is soft, non-distended, and non-tender in all quadrants. No rigidity, no guarding. No peritoneal signs.  Genitourinary:   Musculoskeletal:        General: Normal range of motion.     Cervical back: Normal range of motion.  Skin:    General: Skin is warm and dry.     Capillary Refill: Capillary refill takes less than 2 seconds.  Neurological:     Mental Status: She is oriented to person, place, and time.     GCS: GCS eye subscore is 4. GCS verbal subscore is 5. GCS motor subscore is 6.     Comments: Fluent speech, no facial droop.  Psychiatric:        Behavior: Behavior normal.     ED Results / Procedures / Treatments   Labs (all labs ordered are listed, but only abnormal results are displayed) Labs Reviewed - No data to display  EKG None  Radiology No results found.  Procedures Procedures (including critical care time)  Medications Ordered in ED Medications  clindamycin (CLEOCIN) capsule 450 mg (has no administration in time range)    ED Course  I have reviewed the triage vital signs and the nursing notes.  Pertinent labs & imaging results that were available during my care of the patient were reviewed by me and considered in my medical decision making (see chart for details).    MDM Rules/Calculators/A&P                          History provided by patient with additional history obtained from  chart review.    Patient has hyperglycemia listed in her problem list.  Looking in chart  review she had a normal A1c in 2017.  She is not diabetic and was not given a diagnosis of prediabetes.  Pt is without gross abscess for which I&D would be possible. Erythema and induration in right groin and does not extend to genitals.  Area marked and pt encouraged to return if redness begins to streak, extends beyond the markings, and/or fever or nausea/vomiting develop.  Pt is alert, oriented, NAD, afebrile, non tachycardic, nonseptic and nontoxic appearing.  Pt to be d/c on oral antibiotics with strict f/u instructions. Had lengthy discussion about the importance of warm compresses. Given information for women's clinic for outpatient follow up if needed.   Portions of this note were generated with Scientist, clinical (histocompatibility and immunogenetics). Dictation errors may occur despite best attempts at proofreading.    Final Clinical Impression(s) / ED Diagnoses Final diagnoses:  Cellulitis of right groin    Rx / DC Orders ED Discharge Orders         Ordered    clindamycin (CLEOCIN) 150 MG capsule  Every 8 hours        07/27/20 1659           Jamier Urbas, [place], PA-C 07/27/20 1723    Linwood Dibbles, MD 07/28/20 1556

## 2020-10-19 ENCOUNTER — Ambulatory Visit: Payer: Self-pay | Admitting: Critical Care Medicine

## 2020-10-25 NOTE — Progress Notes (Signed)
Subjective:    Patient ID: Melanie Rodriguez, female    DOB: 1986/01/30, 35 y.o.   MRN: [id number]  35 y.o.F here to est care this visit is accomplished with [employer] interpreter for Spanish  This patient is seen today to establish care and on arrival complains of dizziness and nausea that is intermittent along with chronic headache.  She missed her menstrual period in December and has been having menorrhagia with her menstrual period in November and again starting this month.  [facility] but not [facility] in since 2018.  She states she has episodes of emesis with nausea and severe heartburn.  Sometimes there is blood mixed with the emesis.  She did have a negative home pregnancy test yesterday and on arrival urine pregnancy test here in the clinic is negative.  The patient has had 4 children by vaginal deliveries no miscarriages no abortions.  She still remains sexually active and does not use protection.  She denies any vaginal discharge at this time.  The patient is due a Pap smear and has chronic pelvic pain.  She had an IUD in the past but this has been removed.  She complains of also chronic sinus congestion sinus pressure nasal congestion and postnasal drip along with rhinorrhea  The patient also complains of bloating after eating.  The patient's had hyperglycemia during pregnancy.  She is also a history of hemorrhagic ovarian cyst.  Also had cholestasis of pregnancy in the past.  She is status post appendectomy.   Patient denies any alcohol or tobacco use she had an orange card in the past this is expired she wishes to reapply.  She also notes some itching in the eyes rhinitis and mood changes       Past Medical History:  Diagnosis Date  . 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 Hemorrhagic ovarian cyst 10/03/2011   On CT 09/21/11   . Hyperglycemia 10/2015  . Pelvic abscess 07/2011   Complication of appendicitis--ultimately underwent  percutaneous drainag and also had ureteral stent placed temporarily due to obstruction from abscess in right pelvis     Family History  Problem Relation Age of Onset  . Diabetes Mother   . Hearing loss Mother   . Cancer Father        lymphoma--not sure which type.     Social History   Socioeconomic History  . Marital status: Single    Spouse name: Not on file  . Number of children: 3  . Years of education: 10  . Highest education level: Not on file  Occupational History  . 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 Smoking status: Never Smoker  . Smokeless tobacco: Never Used  Vaping Use  . Vaping Use: Never used  Substance and Sexual Activity  . Alcohol use: No  . Drug use: No  . Sexual activity: Yes    Birth control/protection: I.U.D.    Comment: Mirena IUD removed June of 2017 due to menorrhagia.    Other Topics Concern  . Not on file  Social History Narrative   Originally from [place].   [place]   Lives at home with her 3 sons and her parents, who are separated, but come to live with her family on and off.   Social Determinants of Health   Financial Resource Strain: Not on file  Food Insecurity: Not on file  Transportation Needs: Not  on file  Physical Activity: Not on file  Stress: Not on file  Social Connections: Not on file  Intimate Partner Violence: Not on file     No Known Allergies   Outpatient Medications Prior to Visit  Medication Sig Dispense Refill  . docusate sodium (COLACE) 100 MG capsule Take 1 capsule (100 mg total) by mouth 2 (two) times daily. (Patient not taking: No sig reported) 10 capsule 0  . ibuprofen (ADVIL,MOTRIN) 600 MG tablet Take 1 tablet (600 mg total) by mouth every 6 (six) hours. (Patient not taking: No sig reported) 30 tablet 0   No facility-administered medications prior to visit.    Review of Systems  Constitutional: Negative.  Negative for fatigue.  HENT: Positive for  congestion, postnasal drip, rhinorrhea and sinus pressure. Negative for dental problem, ear discharge, ear pain, hearing loss, sinus pain, sneezing and sore throat.   Eyes: Negative.  Negative for visual disturbance.  Respiratory: Negative.   Cardiovascular: Negative.   Gastrointestinal: Positive for abdominal pain, nausea and vomiting. Negative for blood in stool, constipation, diarrhea and rectal pain.       Bloating with eating GERD Blood with emesis  Endocrine: Negative.   Genitourinary: Positive for menstrual problem and pelvic pain. Negative for decreased urine volume, difficulty urinating, dyspareunia, dysuria, hematuria, urgency, vaginal bleeding, vaginal discharge and vaginal pain.       Notes heavy menses with bleeding  Musculoskeletal: Positive for back pain.       Wrist and legs pain  Skin: Positive for rash.       Rash itching on back of neck  Neurological: Positive for dizziness, light-headedness and headaches.  Psychiatric/Behavioral: Positive for dysphoric mood and sleep disturbance. Negative for self-injury and suicidal ideas. The patient is nervous/anxious.        Objective:   Physical Exam Vitals:   10/27/20 1541  BP: 116/79  Pulse: 68  Resp: 16  SpO2: 98%  Weight: 173 lb (78.5 kg)    Gen: Pleasant, well-nourished, in no distress,  normal affect  ENT: No lesions,  mouth clear,  oropharynx clear, 3+postnasal drip, significant turbinate edema bilaterally without purulence  Neck: No JVD, no TMG, no carotid bruits  Lungs: No use of accessory muscles, no dullness to percussion, clear without rales or rhonchi  Cardiovascular: RRR, heart sounds normal, no murmur or gallops, no peripheral edema  Abdomen: soft tender left lower quadrant and suprapubic area as well no rebound or guarding, no HSM,  BS normal  Musculoskeletal: No deformities, no cyanosis or clubbing  Neuro: alert, non focal  Skin: Warm, no lesions or rashes  No results found.         Assessment & Plan:  I personally reviewed all images and lab data in the [facility] system as well as any outside material available during this office visit and agree with the  radiology impressions.   Gastroesophageal reflux disease with esophagitis and hemorrhage Reflux disease with some blood with emesis but no evidence of acute pain on exam  We will give patient reflux diet can begin Protonix 40 mg daily  We will check H. pylori immunoglobulin G level and CBC  Menorrhagia with irregular cycle Patient needs a Pap smear and repeat evaluation of uterus will refer to gynecology with menorrhagia with irregular cycle  Pelvic pain As per menorrhagia assessment  Urinary frequency Check urinalysis doubt urinary tract infection  Allergic rhinitis Unspecified allergic rhinitis with nasal turbinate edema also with eye irritation but currently no evidence of allergic  conjunctivitis on exam  Begin Flonase 2 sprays each nostril daily   Diagnoses and all orders for this visit:  Pelvic pain -     Comprehensive metabolic panel -     CBC with Differential/Platelet -     Cancel: Ambulatory referral to Gynecology -     Ambulatory referral to Gynecology  Menorrhagia with irregular cycle -     CBC with Differential/Platelet -     Cancel: Ambulatory referral to Gynecology -     Ambulatory referral to Gynecology -     POCT urine pregnancy  Urinary frequency -     Urinalysis  Need for hepatitis C screening test -     HCV Ab w Reflex to Quant PCR  Gastroesophageal reflux disease with esophagitis and hemorrhage -     H. pylori antibody, IgG  Need for immunization against influenza -     Flu Vaccine QUAD 36+ mos IM  Allergic rhinitis, unspecified seasonality, unspecified trigger  Other orders -     Discontinue: fluticasone (FLONASE) 50 MCG/ACT nasal spray; Place 2 sprays into both nostrils daily. -     pantoprazole (PROTONIX) 40 MG tablet; Take 1 tablet (40 mg total) by mouth daily before  breakfast. -     fluticasone (FLONASE) 50 MCG/ACT nasal spray; Place 2 sprays into both nostrils daily.   Note patient is already received Covid vaccination series she is due for booster the patient could not remember the dates of her vaccine I will have the nurse call her tomorrow as she did not have her vaccine card with her or we will check the state database  Patient did receive flu vaccine at this visit chck hep C level

## 2020-10-26 ENCOUNTER — Ambulatory Visit: Payer: Self-pay | Admitting: Critical Care Medicine

## 2020-10-27 ENCOUNTER — Ambulatory Visit: Payer: Self-pay | Attending: Critical Care Medicine | Admitting: Critical Care Medicine

## 2020-10-27 ENCOUNTER — Encounter: Payer: Self-pay | Admitting: Critical Care Medicine

## 2020-10-27 ENCOUNTER — Other Ambulatory Visit: Payer: Self-pay | Admitting: Critical Care Medicine

## 2020-10-27 ENCOUNTER — Other Ambulatory Visit: Payer: Self-pay

## 2020-10-27 VITALS — BP 116/79 | HR 68 | Resp 16 | Wt 173.0 lb

## 2020-10-27 DIAGNOSIS — R102 Pelvic and perineal pain: Secondary | ICD-10-CM

## 2020-10-27 DIAGNOSIS — J309 Allergic rhinitis, unspecified: Secondary | ICD-10-CM | POA: Insufficient documentation

## 2020-10-27 DIAGNOSIS — K2101 Gastro-esophageal reflux disease with esophagitis, with bleeding: Secondary | ICD-10-CM | POA: Insufficient documentation

## 2020-10-27 DIAGNOSIS — R350 Frequency of micturition: Secondary | ICD-10-CM | POA: Insufficient documentation

## 2020-10-27 DIAGNOSIS — Z23 Encounter for immunization: Secondary | ICD-10-CM

## 2020-10-27 DIAGNOSIS — N921 Excessive and frequent menstruation with irregular cycle: Secondary | ICD-10-CM | POA: Insufficient documentation

## 2020-10-27 DIAGNOSIS — Z1159 Encounter for screening for other viral diseases: Secondary | ICD-10-CM

## 2020-10-27 LAB — POCT URINE PREGNANCY: Preg Test, Ur: NEGATIVE

## 2020-10-27 MED ORDER — FLUTICASONE PROPIONATE 50 MCG/ACT NA SUSP: 2.0000 | Freq: Every day | NASAL | 6 refills | Status: DC

## 2020-10-27 MED ORDER — PANTOPRAZOLE SODIUM 40 MG PO TBEC: 40.0000 mg | DELAYED_RELEASE_TABLET | Freq: Every day | ORAL | 3 refills | Status: DC

## 2020-10-27 NOTE — Assessment & Plan Note (Signed)
Patient needs a Pap smear and repeat evaluation of uterus will refer to gynecology with menorrhagia with irregular cycle

## 2020-10-27 NOTE — Assessment & Plan Note (Signed)
Unspecified allergic rhinitis with nasal turbinate edema also with eye irritation but currently no evidence of allergic conjunctivitis on exam  Begin Flonase 2 sprays each nostril daily

## 2020-10-27 NOTE — Assessment & Plan Note (Signed)
Check urinalysis doubt urinary tract infection

## 2020-10-27 NOTE — Assessment & Plan Note (Signed)
As per menorrhagia assessment 

## 2020-10-27 NOTE — Patient Instructions (Addendum)
Labs today include metabolic panel blood count urinalysis hepatitis C study and H pylorii study  Referral to gynecology will be made for your regular menstrual periods pelvic pain Pap smear  Begin Flonase 2 sprays each nostril daily for nasal congestion  Begin protonix 40 mg daily take 1/2-hour before you eat and then eat a meal  A flu vaccine was given  We will call you with results of your labs  Please consider getting a Covid vaccine BOOSTER  below as we can find information to obtain this COVID-19 Vaccine Information can be found at: PodExchange.nl For questions related to vaccine distribution or appointments, please email [email or web address] or call [phone].    Return to see Dr. Delford Field in 2 months  Follow diet below: __________________________________________________________________________________________________________ Los laboratorios de hoy incluyen el estudio de hepatitis C de anlisis de [place] con recuento sanguneo de Designer, television/film set y [place] de H pylorii  Se har una derivacin a ginecologa para sus perodos menstruales regulares, dolor plvico, frotis de Papanicolaou  Comience con Flonase 2 atomizaciones diarias en cada fosa nasal para la congestin nasal  Comience con omeprazol 20 mg al da, tome 1/2 hora antes de comer y luego coma una comida.  Te llamaremos con los resultados de tus laboratorios.  Considere la posibilidad de obtener una vacuna Covid a continuacin, ya que podemos encontrar informacin para obtener esta Puede encontrar informacin sobre la vacuna COVID-19 en: PodExchange.nl Si tiene preguntas relacionadas con la distribucin de la vacuna o las citas, enve un correo electrnico a [email or web address] o llame al [phone].   Regrese para ver al Dr. Delford Field en 2 meses  Siga la dieta a  continuacin:   Opciones de alimentos para pacientes adultos con enfermedad de reflujo gastroesofgico Food Choices for Gastroesophageal Reflux Disease, Adult Si tiene enfermedad de reflujo gastroesofgico (ERGE), los alimentos que consume y los hbitos de alimentacin son Engineer, production. Elegir los alimentos adecuados puede ayudar a Paramedic las molestias ocasionadas por la [employer]. Considere la posibilidad de trabajar con un especialista en dieta y nutricin (nutricionista) que lo ayude a Software engineer saludables. Qu pautas generales debo seguir?  Plan de alimentacin  Elija alimentos saludables con bajo contenido de grasa, como frutas, verduras, cereales integrales, productos lcteos descremados, carne magra de vaca, de pescado y de ave.  Haga comidas pequeas con frecuencia en lugar de tres comidas abundantes al da. Coma lentamente, en un ambiente distendido. Evite agacharse o recostarse hasta despus de 2 o 3horas de haber comido.  Limite los alimentos con alto contenido graso como las carnes grasas o los alimentos fritos.  Limite el consumo de [place], [place] y [place] a menos de 8 cucharaditas al Futures trader.  Evite lo siguiente: ? Consumir alimentos que le ocasionen sntomas. Pueden ser distintos para cada persona. Lleve un registro de los alimentos para identificar aquellos que le ocasionen sntomas. ? Consumir alcohol. ? Beber grandes cantidades de lquido con las comidas. ? Comer 2 o 3 horas antes de acostarse.  Cocine los alimentos utilizando mtodos que no sean la fritura. Esto puede Writer, Technical sales engineer y hervir. Estilo de vida  Mantenga un peso saludable. Pregunte a su mdico cul es el peso saludable para usted. Si debe perder peso, hable con su mdico para hacerlo de manera segura.  Realice actividad fsica durante, al menos, 30 minutos 5 das por semana o ms, o segn lo indicado por su mdico.  Evite usar ropa ajustada alrededor de la cintura y Naval architect.  No  consuma ningn producto que  contenga nicotina o tabaco, como cigarrillos y cigarrillos electrnicos. Si necesita ayuda para dejar de fumar, consulte al mdico.  Duerma con la cabecera de la cama elevada. Use una cua debajo del colchn o bloques debajo del armazn de la cama para Pharmacologist la cabecera de la cama elevada. Qu alimentos no se recomiendan? Esta podra no ser [employer]. Hable con el nutricionista sobre las mejores opciones alimenticias para usted. Carbohidratos Pasteles o panes sin levadura con grasa agregada. Tostadas francesas. Verduras Verduras fritas en abundante aceite. Papas fritas. Cualquier verdura que est preparada con grasa agregada. Cualquier verdura que le ocasione sntomas. Para algunas personas, estas pueden incluir tomates y productos con tomate, [place], cebollas y [place], y rbanos picantes. Frutas Cualquier fruta que est preparada con grasa agregada. Cualquier fruta que le ocasione sntomas. Para algunas personas, estas pueden incluir, las frutas ctricas como naranja, pomelo, pia y limn. Carnes y otros alimentos ricos en protenas Carnes de alto contenido graso como carne grasa de vaca o cerdo, salchichas, costillas, [place], salchicha, salame y tocino. Carnes o protenas fritas, lo que incluye pescado frito y pollo frito. Nueces y [place] de frutos secos. Lcteos Leche entera y [place] con chocolate. [employer]. Crema. Helados. Queso crema. Batidos con [employer]. Bebidas Caf y t negro, con o sin cafena. Bebidas carbonatadas. Refrescos. Bebidas energizantes. Jugo de fruta hecho con frutas cidas (como naranja o pomelo). Jugo de tomate. Bebidas alcohlicas. Grasas y [employer]. Margarina. Lardo. Mantequilla clarificada. Dulces y postres Chocolate y cacao. Rosquillas. Condimentos y otros alimentos [place]. Menta y mentol. Cualquier condimento, hierbas o aderezos que le ocasionen sntomas. Para algunas personas, esto puede incluir curry, salsa  picante o aderezos para ensalada a base de vinagre. Resumen  Si tiene enfermedad de reflujo gastroesofgico (ERGE), las elecciones de alimentos y [place] de vida son muy importantes para ayudar a Paramedic las molestias de la [place].  Haga comidas pequeas con frecuencia en lugar de tres comidas abundantes al da. Coma lentamente, en un ambiente distendido. Evite agacharse o recostarse hasta despus de 2 o 3horas de haber comido.  Limite los alimentos con alto contenido graso como la carne grasa o los alimentos fritos. Esta informacin no tiene Theme park manager el consejo del mdico. Asegrese de hacerle al mdico cualquier pregunta que tenga. Document Revised: 01/30/2017 Document Reviewed: 08/14/2013 Elsevier Patient Education  2020 [employer].

## 2020-10-27 NOTE — Progress Notes (Signed)
Dizzy and nausea, intermittently 5/10 discomfort "Pain between eyes" occasionally   No menstraul cycle in December.  Had menstrual cycle November 6 and started cycle today for January.  Pregnancy test at home are negative

## 2020-10-27 NOTE — Assessment & Plan Note (Signed)
Reflux disease with some blood with emesis but no evidence of acute pain on exam  We will give patient reflux diet can begin Protonix 40 mg daily  We will check H. pylori immunoglobulin G level and CBC

## 2020-10-28 ENCOUNTER — Encounter: Payer: Self-pay | Admitting: *Deleted

## 2020-10-28 LAB — CBC WITH DIFFERENTIAL/PLATELET
Lymphs: 42 %
Monocytes: 7 %
Platelets: 285 10*3/uL (ref 150–450)
RBC: 4.66 x10E6/uL (ref 3.77–5.28)

## 2020-10-28 LAB — COMPREHENSIVE METABOLIC PANEL
Creatinine, Ser: 0.55 mg/dL — ABNORMAL LOW (ref 0.57–1.00)
GFR calc Af Amer: 140 mL/min/{1.73_m2} (ref >59)
GFR calc non Af Amer: 121 mL/min/{1.73_m2} (ref >59)
Sodium: 138 mmol/L (ref 134–144)

## 2020-10-28 LAB — URINALYSIS

## 2020-10-28 MED FILL — FLUTICASONE PROP 50 MCG SPR: 50 | 30 days supply | Qty: 16 | Fill #0

## 2020-10-28 MED FILL — PANTOPRAZOLE SOD DR 40 MG T: 40 | 30 days supply | Qty: 30 | Fill #0

## 2020-10-29 LAB — CBC WITH DIFFERENTIAL/PLATELET
Basophils Absolute: 0 10*3/uL (ref 0.0–0.2)
Basos: 1 %
EOS (ABSOLUTE): 0.3 10*3/uL (ref 0.0–0.4)
Eos: 3 %
Hematocrit: 38 % (ref 34.0–46.6)
Hemoglobin: 13.4 g/dL (ref 11.1–15.9)
Immature Grans (Abs): 0 10*3/uL (ref 0.0–0.1)
Immature Granulocytes: 0 %
Lymphocytes Absolute: 3.5 10*3/uL — ABNORMAL HIGH (ref 0.7–3.1)
MCH: 28.8 pg (ref 26.6–33.0)
MCHC: 35.3 g/dL (ref 31.5–35.7)
MCV: 82 fL (ref 79–97)
Monocytes Absolute: 0.6 10*3/uL (ref 0.1–0.9)
Neutrophils Absolute: 4 10*3/uL (ref 1.4–7.0)
Neutrophils: 47 %
RDW: 14.1 % (ref 11.7–15.4)
WBC: 8.3 10*3/uL (ref 3.4–10.8)

## 2020-10-29 LAB — COMPREHENSIVE METABOLIC PANEL
ALT: 32 IU/L (ref 0–32)
AST: 29 IU/L (ref 0–40)
Albumin/Globulin Ratio: 1.1 — ABNORMAL LOW (ref 1.2–2.2)
Albumin: 4 g/dL (ref 3.8–4.8)
Alkaline Phosphatase: 88 IU/L (ref 44–121)
BUN/Creatinine Ratio: 18 (ref 9–23)
BUN: 10 mg/dL (ref 6–20)
Bilirubin Total: 0.3 mg/dL (ref 0.0–1.2)
CO2: 21 mmol/L (ref 20–29)
Calcium: 9.3 mg/dL (ref 8.7–10.2)
Chloride: 100 mmol/L (ref 96–106)
Globulin, Total: 3.5 g/dL (ref 1.5–4.5)
Glucose: 95 mg/dL (ref 65–99)
Potassium: 4.2 mmol/L (ref 3.5–5.2)
Total Protein: 7.5 g/dL (ref 6.0–8.5)

## 2020-10-29 LAB — H. PYLORI ANTIBODY, IGG

## 2020-10-29 LAB — URINALYSIS
Bilirubin, UA: NEGATIVE
Glucose, UA: NEGATIVE
Ketones, UA: NEGATIVE
Leukocytes,UA: NEGATIVE
Nitrite, UA: NEGATIVE
Protein,UA: NEGATIVE
Specific Gravity, UA: 1.021 (ref 1.005–1.030)
Urobilinogen, Ur: 0.2 mg/dL (ref 0.2–1.0)
pH, UA: 6.5 (ref 5.0–7.5)

## 2020-10-29 LAB — HCV AB W REFLEX TO QUANT PCR: HCV Ab: <0.1 s/co ratio (ref 0.0–0.9)

## 2020-10-29 LAB — HCV INTERPRETATION

## 2020-11-04 ENCOUNTER — Ambulatory Visit: Payer: Self-pay

## 2020-11-10 ENCOUNTER — Ambulatory Visit: Payer: Self-pay | Attending: Critical Care Medicine

## 2020-11-10 ENCOUNTER — Other Ambulatory Visit: Payer: Self-pay

## 2020-12-29 ENCOUNTER — Other Ambulatory Visit: Payer: Self-pay | Admitting: Critical Care Medicine

## 2020-12-29 ENCOUNTER — Encounter: Payer: Self-pay | Admitting: Critical Care Medicine

## 2020-12-29 ENCOUNTER — Ambulatory Visit: Payer: Self-pay | Attending: Critical Care Medicine | Admitting: Critical Care Medicine

## 2020-12-29 ENCOUNTER — Other Ambulatory Visit: Payer: Self-pay

## 2020-12-29 VITALS — BP 122/80 | HR 75 | Wt 172.2 lb

## 2020-12-29 DIAGNOSIS — K2101 Gastro-esophageal reflux disease with esophagitis, with bleeding: Secondary | ICD-10-CM

## 2020-12-29 DIAGNOSIS — R102 Pelvic and perineal pain: Secondary | ICD-10-CM

## 2020-12-29 DIAGNOSIS — N921 Excessive and frequent menstruation with irregular cycle: Secondary | ICD-10-CM

## 2020-12-29 DIAGNOSIS — J309 Allergic rhinitis, unspecified: Secondary | ICD-10-CM

## 2020-12-29 MED ORDER — PANTOPRAZOLE SODIUM 40 MG PO TBEC: 40.0000 mg | DELAYED_RELEASE_TABLET | Freq: Every day | ORAL | 3 refills | Status: DC

## 2020-12-29 MED FILL — PANTOPRAZOLE SOD DR 40 MG T: 40 | 30 days supply | Qty: 30 | Fill #0

## 2020-12-29 NOTE — Assessment & Plan Note (Signed)
Pending gynecology appointment March 10

## 2020-12-29 NOTE — Patient Instructions (Addendum)
COVID-19 Vaccine Information can be found at: PodExchange.nl For questions related to vaccine distribution or appointments, please email [email or web address] or call [phone].    No change in medications  You have an appointment March 10 with Scheryl Darter of gynecology room remind him you need a Pap smear when you see him  Please get your Covid booster above is where you can obtain it call the number  I am going to call you about the clinic you are going to for the liposuction in [place] just want to check it out and make sure it is reliable  Return to see Dr. Delford Field 4 months  La informacin sobre la vacuna COVID-19 se puede encontrar en: PodExchange.nl Para preguntas relacionadas con la distribucin de vacunas o citas, enve un correo electrnico a [email or web address] o llame al [phone].   Sin cambios en los medicamentos  Tiene una cita el 10 de marzo con Scheryl Darter de la sala de ginecologa, recurdele que necesita una prueba de Papanicolaou cuando lo vea.  Obtenga su refuerzo Covid arriba es donde puede obtenerlo llame al nmero  Voy a llamarlo acerca de la clnica a la que asistir para la liposuccin en [place], solo quiero verificarla y asegurarme de que sea confiable.  Volver a ver al Dr. Delford Field 4 meses

## 2020-12-29 NOTE — Progress Notes (Signed)
Subjective:    Patient ID: Melanie Rodriguez, female    DOB: 16-Nov-1985, 35 y.o.   MRN: [id number]  35 y.o.F here to est care this visit is accomplished with [employer] interpreter for Spanish 10/27/20 This patient is seen today to establish care and on arrival complains of dizziness and nausea that is intermittent along with chronic headache.  She missed her menstrual period in December and has been having menorrhagia with her menstrual period in November and again starting this month.  [facility] but not [facility] in since 2018.  She states she has episodes of emesis with nausea and severe heartburn.  Sometimes there is blood mixed with the emesis.  She did have a negative home pregnancy test yesterday and on arrival urine pregnancy test here in the clinic is negative.  The patient has had 4 children by vaginal deliveries no miscarriages no abortions.  She still remains sexually active and does not use protection.  She denies any vaginal discharge at this time.  The patient is due a Pap smear and has chronic pelvic pain.  She had an IUD in the past but this has been removed.  She complains of also chronic sinus congestion sinus pressure nasal congestion and postnasal drip along with rhinorrhea  The patient also complains of bloating after eating.  The patient's had hyperglycemia during pregnancy.  She is also a history of hemorrhagic ovarian cyst.  Also had cholestasis of pregnancy in the past.  She is status post appendectomy.   Patient denies any alcohol or tobacco use she had an orange card in the past this is expired she wishes to reapply.  She also notes some itching in the eyes rhinitis and mood changes  12/29/2020 This patient is seen today in return follow-up in this visit was assisted with language barrier using Spanish interpreter Misty Stanley.  This patient does have an upcoming gynecology appointment March 10 with Dr. Debroah Loop for Pap smear and evaluation of  pelvic pain and dysfunctional bleeding.  Patient also has questions about getting labs to clear her for planned liposuction and breast plant implantations.  She is planning to have this done in [place] at a clinic there.  Patient has no other complaints at this time.  Reflux symptoms are improved on Protonix.  The patient has yet to receive her Covid booster and needed information as to how to obtain the booster.      Past Medical History:  Diagnosis Date  . 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 Hemorrhagic ovarian cyst 10/03/2011   On CT 09/21/11   . Hyperglycemia 10/2015  . Pelvic abscess 07/2011   Complication of appendicitis--ultimately underwent percutaneous drainag and also had ureteral stent placed temporarily due to obstruction from abscess in right pelvis     Family History  Problem Relation Age of Onset  . Diabetes Mother   . Hearing loss Mother   . Cancer Father        lymphoma--not sure which type.     Social History   Socioeconomic History  . Marital status: Single    Spouse name: Not on file  . Number of children: 3  . Years of education: 10  . Highest education level: Not on file  Occupational History  . 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 Smoking status: Never Smoker  . Smokeless tobacco: Never Used  Vaping Use  . Vaping Use: Never used  Substance and Sexual Activity  . Alcohol use: No  . Drug use: No  . Sexual activity: Yes    Birth control/protection: I.U.D.    Comment: Mirena IUD removed June of 2017 due to menorrhagia.    Other Topics Concern  . Not on file  Social History Narrative   Originally from [place].   [place]   Lives at home with her 3 sons and her parents, who are separated, but come to live with her family on and off.   Social Determinants of Health   Financial Resource Strain: Not on file  Food Insecurity: Not on file  Transportation Needs: Not on file  Physical  Activity: Not on file  Stress: Not on file  Social Connections: Not on file  Intimate Partner Violence: Not on file     No Known Allergies   Outpatient Medications Prior to Visit  Medication Sig Dispense Refill  . fluticasone (FLONASE) 50 MCG/ACT nasal spray Place 2 sprays into both nostrils daily. 16 g 6  . pantoprazole (PROTONIX) 40 MG tablet Take 1 tablet (40 mg total) by mouth daily before breakfast. 30 tablet 3   No facility-administered medications prior to visit.    Review of Systems  Constitutional: Negative.  Negative for fatigue.  HENT: Negative for congestion, dental problem, ear discharge, ear pain, hearing loss, rhinorrhea, sinus pressure, sinus pain, sneezing and sore throat.   Eyes: Negative.  Negative for visual disturbance.  Respiratory: Negative.   Cardiovascular: Negative.   Gastrointestinal: Negative for abdominal pain, blood in stool, constipation, diarrhea, nausea, rectal pain and vomiting.        GERD improved   Endocrine: Negative.   Genitourinary: Positive for menstrual problem and pelvic pain. Negative for decreased urine volume, difficulty urinating, dyspareunia, dysuria, hematuria, urgency, vaginal bleeding, vaginal discharge and vaginal pain.       Notes heavy menses with bleeding  Musculoskeletal: Negative for back pain.  Skin: Negative for rash.  Neurological: Negative for dizziness, light-headedness and headaches.  Psychiatric/Behavioral: Negative for dysphoric mood, self-injury, sleep disturbance and suicidal ideas. The patient is not nervous/anxious.        Objective:   Physical Exam Vitals:   12/29/20 1538  BP: 122/80  Pulse: 75  SpO2: 96%  Weight: 172 lb 3.2 oz (78.1 kg)    Gen: Pleasant, well-nourished, in no distress,  normal affect  ENT: No lesions,  mouth clear,  oropharynx clear, normal  Neck: No JVD, no TMG, no carotid bruits  Lungs: No use of accessory muscles, no dullness to percussion, clear without rales or  rhonchi  Cardiovascular: RRR, heart sounds normal, no murmur or gallops, no peripheral edema  Abdomen: soft NT no rebound or guarding, no HSM,  BS normal  Musculoskeletal: No deformities, no cyanosis or clubbing  Neuro: alert, non focal  Skin: Warm, no lesions or rashes  No results found.        Assessment & Plan:  I personally reviewed all images and lab data in the [facility] system as well as any outside material available during this office visit and agree with the  radiology impressions.   Gastroesophageal reflux disease with esophagitis and hemorrhage Markedly better on proton pump inhibitor continue same  Menorrhagia with irregular cycle Pending gynecology appointment March 10  Allergic rhinitis Improved with Flonase and antihistamine therapy  I told the patient I would be looking into the plastic surgery clinic she is going to in [place] to make sure it is qualified and  I told her I had be willing to get labs for her preop but I need to check the clinic out first  Patient given information to obtain a COVID booster vaccine

## 2020-12-29 NOTE — Assessment & Plan Note (Signed)
Markedly better on proton pump inhibitor continue same

## 2020-12-29 NOTE — Assessment & Plan Note (Signed)
Improved with Flonase and antihistamine therapy

## 2020-12-31 ENCOUNTER — Ambulatory Visit (INDEPENDENT_AMBULATORY_CARE_PROVIDER_SITE_OTHER): Payer: Self-pay | Admitting: Obstetrics & Gynecology

## 2020-12-31 ENCOUNTER — Other Ambulatory Visit: Payer: Self-pay | Admitting: Obstetrics & Gynecology

## 2020-12-31 ENCOUNTER — Encounter: Payer: Self-pay | Admitting: Obstetrics & Gynecology

## 2020-12-31 ENCOUNTER — Other Ambulatory Visit (HOSPITAL_COMMUNITY)
Admission: RE | Admit: 2020-12-31 | Discharge: 2020-12-31 | Disposition: A | Discharge status: Home / Self Care | Payer: Self-pay | Source: Ambulatory Visit | Attending: Obstetrics & Gynecology | Admitting: Obstetrics & Gynecology

## 2020-12-31 ENCOUNTER — Other Ambulatory Visit: Payer: Self-pay

## 2020-12-31 VITALS — BP 126/79 | HR 73 | Wt 171.0 lb

## 2020-12-31 DIAGNOSIS — N921 Excessive and frequent menstruation with irregular cycle: Secondary | ICD-10-CM

## 2020-12-31 DIAGNOSIS — Z124 Encounter for screening for malignant neoplasm of cervix: Secondary | ICD-10-CM | POA: Insufficient documentation

## 2020-12-31 DIAGNOSIS — Z0000 Encounter for general adult medical examination without abnormal findings: Secondary | ICD-10-CM

## 2020-12-31 DIAGNOSIS — R102 Pelvic and perineal pain: Secondary | ICD-10-CM

## 2020-12-31 DIAGNOSIS — Z3202 Encounter for pregnancy test, result negative: Secondary | ICD-10-CM

## 2020-12-31 DIAGNOSIS — Z01419 Encounter for gynecological examination (general) (routine) without abnormal findings: Secondary | ICD-10-CM

## 2020-12-31 LAB — POCT PREGNANCY, URINE: Preg Test, Ur: NEGATIVE

## 2020-12-31 MED ORDER — NORGESTIMATE-ETH ESTRADIOL 0.25-35 MG-MCG PO TABS
1.0000 | ORAL_TABLET | Freq: Every day | ORAL | 11 refills | Status: DC
Start: 2020-12-31 — End: 2020-12-31

## 2020-12-31 MED FILL — MONO-LINYAH 28 TABLET: 0.25-35 | 28 days supply | Qty: 28 | Fill #0

## 2020-12-31 NOTE — Patient Instructions (Signed)
Informacin sobre los anticonceptivos orales Oral Contraception Information Los anticonceptivos orales (ACO) son medicamentos que se toman por va oral para evitar un embarazo. Funcionan de las siguientes maneras:  Evitando que los ovarios liberen vulos.  Engrosando la mucosidad de la parte inferior del tero (cuello uterino). Esto evita que los espermatozoides entren en el tero.  Adelgazando el revestimiento del tero (endometrio). Esto evita que el vulo fecundado se implante en el endometrio. Los ACO son muy efectivos cuando se toman exactamente como se prescriben. Sin embargo, no evitan las infecciones de transmisin sexual (ITS). El uso de condones junto con los ACO ayuda a prevenir ese tipo de enfermedades. Qu sucede antes de comenzar a tomar ACO? Antes de comenzar a tomar ACO:  Quizs le harn un examen fsico, anlisis de sangre y prueba de Papanicolaou.  El mdico se asegurar de que usted sea una buena candidata para usar anticonceptivos orales. Los ACO no son una buena opcin para ciertas mujeres, como: ? Mujeres que fuman y tienen ms de 35 aos. ? Mujeres que tienen o han tenido algunas afecciones, por ejemplo:  Antecedentes de presin arterial alta.  Trombosis venosa profunda.  Embolia pulmonar.  Accidente cerebrovascular.  Enfermedad cardiovascular.  Enfermedad vascular perifrica. Consulte al mdico acerca de los posibles efectos secundarios de los ACO que podra recetarle. Tenga en cuenta que puede llevar de 2 a 3meses que el organismo se adapte a los cambios en los niveles hormonales. Tipos de anticonceptivos orales Las pldoras anticonceptivas contienen las hormonas estrgeno y progestina (progesterona sinttica) o progestina sola. La pldora combinada Este tipo de pldora contiene estrgeno y progestina.  Las pldoras anticonceptivas convencionales vienen en envases de 21 o 28pldoras. ? Algunos envases con pldoras para 28 das contienen estrgeno y  progestina durante los primeros 21 a 24das. Los comprimidos sin hormonas, llamados placebos, se toman durante los ltimos 4 a 7das. Debera tener el sangrado menstrual durante el tiempo en que toma los placebos. ? Con los envases de 21 comprimidos, no se toman pldoras durante 7das. El sangrado menstrual se produce durante estos das. (Algunas personas prefieren tomar una pldora durante 28das para ayudar a establecer una rutina).  Las pldoras anticonceptivas de intervalo extendido vienen en envases de 91pldoras. Los primeros 84comprimidos tienen estrgeno y progestina. Las ltimas 7pldoras son placebos. El sangrado menstrual se produce durante los das en que se toma placebo. Con este programa, el sangrado menstrual se produce una vez cada 3meses.  Las pldoras anticonceptivas continuas vienen en envases de 28pldoras. Todas las pldoras del envase contienen estrgeno y progestina. Con este programa, no se produce el sangrado menstrual regular, pero puede haber manchas o sangrado irregular. Pastillas de progestina sola Este tipo de pldora se denomina a menudo "minipldora" y contiene solo la hormona progestina. Viene en paquetes de 28 pldoras. En algunos envases, las ltimas 4pldoras son placebos. La pldora debe tomarse a la misma hora todos los das. Esto es muy importante para evitar el embarazo. Es posible que el sangrado menstrual no sea regular o predecible.   Cules son las ventajas? Los anticonceptivos orales proporcionan un mtodo anticonceptivo confiable y continuo si se toman segn las indicaciones. Pueden tratar o disminuir los sntomas de lo siguiente:  Clicos durante los perodos menstruales.  Ciclos menstruales o sangrado irregulares.  Flujo menstrual abundante.  Sangrado uterino anormal.  Acn, segn el tipo de pldora.  Sndrome del ovario poliqustico (SOP).  Endometriosis.  Anemia por deficiencia de hierro.  Sntomas premenstruales, incluidos  irritabilidad, depresin o ansiedad intensas.   Tambin pueden:  Reducir el riesgo de cncer de endometrio y de ovario.  Usarse como anticonceptivo de Associate Professor.  Evitar los [employer] ectpicos y las infecciones de las trompas de [place]. Qu puede hacer que los ACO sean menos eficaces? Los ACO pueden ser menos eficaces si:  Se olvida de tomar la [employer]. En el caso de las pldoras de progestina sola, es especialmente importante tomar la pldora a la misma hora Management consultant. Incluso tomarla 3horas tarde puede aumentar el riesgo de [place].  Tiene una enfermedad estomacal o intestinal que reduce la capacidad del cuerpo para absorber la pldora.  Toma los ACO junto con otros medicamentos que los hacen menos efectivos, como antibiticos, ciertos medicamentos para el VIH y algunos medicamentos para las convulsiones.  Toma ACO que han vencido.  Se olvida de reiniciar la toma de la pldora despus de 7das de no tomarla. Esto se refiere a los envases de 21pldoras. Cules son los efectos secundarios y los riesgos? Los ACO a Designer, television/film set causar [employer], como:  Dolor de [place].  Depresin.  Dificultad para dormir.  Nuseas y vmitos.  Dolor a Multimedia programmer.  Sangrado o manchado irregulares durante los primeros meses.  Meteorismo o retencin de lquidos.  Aumento de la presin arterial. Las pldoras combinadas pueden aumentar ligeramente el riesgo de:  Cogulos de [place].  Infarto de miocardio.  Accidente cerebrovascular. Siga estas instrucciones en su casa: Siga las indicaciones del mdico acerca de cmo comenzar a Building services engineer de ACO. En funcin de cundo comienza a Chief of Staff, es posible que deba usar un mtodo anticonceptivo de respaldo, como preservativos, durante la primera semana. Asegrese de saber qu hacer si se olvida de tomar la pldora. Resumen  Los anticonceptivos orales (ACO) son medicamentos que se toman  por va oral para Catering manager. Son muy efectivos cuando se toman exactamente como se indica.  Los ACO contienen una combinacin de las hormonas estrgeno y progestina (progesterona sinttica) o progestina sola.  Antes de comenzar a tomar anticonceptivos orales, debe hacerse un examen fsico, anlisis de [employer] y Neomia Dear prueba de Papanicolaou. El mdico se asegurar de que usted sea [place] buena candidata para usar anticonceptivos orales.  La pldora combinada puede venir en paquetes de 21, 28 o 91das. Las pldoras de progestina sola vienen en envases de 28pldoras.  Los ACO a Designer, television/film set causar [employer], como dolor de [place], nuseas, dolor a la palpacin en las mamas o sangrado irregular. Esta informacin no tiene Theme park manager el consejo del mdico. Asegrese de hacerle al mdico cualquier pregunta que tenga. Document Revised: 08/19/2020 Document Reviewed: 08/19/2020 Elsevier Patient Education  2021 [employer].

## 2020-12-31 NOTE — Progress Notes (Signed)
Patient ID: Melanie Rodriguez, female   DOB: [birth year], 35 y.o.   MRN: [id number]  Chief Complaint  Patient presents with  . Pelvic Pain  . Menorrhagia    HPI Melanie Rodriguez is a 35 y.o. female.  [id number] Patient's last menstrual period was 11/27/2020 (exact date). She uses condoms for [facility]. She has experienced irregular menses, pelvic pain and heavy menses about every 2-3 months since delivery 2018. She had Paragard years before and had dysmenorrhea and heavy flow. No plan to conceive again. HPI  Past Medical History:  Diagnosis Date  . 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 Hemorrhagic ovarian cyst 10/03/2011   On CT 09/21/11   . Hyperglycemia 10/2015  . Pelvic abscess 07/2011   Complication of appendicitis--ultimately underwent percutaneous drainag and also had ureteral stent placed temporarily due to obstruction from abscess in right pelvis    Past Surgical History:  Procedure Laterality Date  . APPENDECTOMY  08/12/2011   s/p abscess w/drain -drain d/c 12/27  . CYSTOSCOPY  10/24/2011   Procedure: CYSTOSCOPY;  Surgeon: Lindaann Slough, MD;  Location: [facility];  Service: Urology;  Laterality: Right;  cysto and stent placement c arm   Abdominoplasty 3 years ago Family History  Problem Relation Age of Onset  . Diabetes Mother   . Hearing loss Mother   . Cancer Father        lymphoma--not sure which type.    Social History Social History   Tobacco Use  . Smoking status: Never Smoker  . Smokeless tobacco: Never Used  Vaping Use  . Vaping Use: Never used  Substance Use Topics  . Alcohol use: No  . Drug use: No    No Known Allergies  Current Outpatient Medications  Medication Sig Dispense Refill  . acetaminophen (TYLENOL) 500 MG tablet Take 1,000 mg by mouth every 6 (six) hours as needed.    . fluticasone (FLONASE) 50 MCG/ACT nasal spray Place 2 sprays into both nostrils daily. 16 g 6  . Ibuprofen (MOTRIN PO) Take by mouth.    .  norgestimate-ethinyl estradiol (ORTHO-CYCLEN) 0.25-35 MG-MCG tablet Take 1 tablet by mouth daily. 28 tablet 11  . pantoprazole (PROTONIX) 40 MG tablet Take 1 tablet (40 mg total) by mouth daily before breakfast. 30 tablet 3   No current facility-administered medications for this visit.    Review of Systems Review of Systems  Constitutional: Negative.   Respiratory: Negative.   Gastrointestinal: Negative.   Genitourinary: Positive for menstrual problem and pelvic pain. Negative for dysuria, vaginal bleeding and vaginal discharge.    Blood pressure 126/79, pulse 73, weight 171 lb (77.6 kg), last menstrual period 11/27/2020, not currently breastfeeding.  Physical Exam Physical Exam Vitals and nursing note reviewed. Exam conducted with a chaperone present.  Constitutional:      Appearance: Normal appearance.  Cardiovascular:     Rate and Rhythm: Normal rate.  Pulmonary:     Effort: Pulmonary effort is normal.  Abdominal:     General: Abdomen is flat.  Genitourinary:    General: Normal vulva.     Vagina: Normal.     Cervix: Normal.     Uterus: Normal.      Adnexa: Right adnexa normal and left adnexa normal.  Neurological:     Mental Status: She is alert.  Psychiatric:        Mood and Affect: Mood normal.        Behavior: Behavior normal.     Data Reviewed   Assessment  Pap smear for cervical cancer screening - Plan: Cytology - PAP( [place])  Menorrhagia with irregular cycle - Plan: norgestimate-ethinyl estradiol (ORTHO-CYCLEN) 0.25-35 MG-MCG tablet  Pelvic pain - Plan: norgestimate-ethinyl estradiol (ORTHO-CYCLEN) 0.25-35 MG-MCG tablet  Well woman exam with routine gynecological exam    Plan Meds ordered this encounter  Medications  . norgestimate-ethinyl estradiol (ORTHO-CYCLEN) 0.25-35 MG-MCG tablet    Sig: Take 1 tablet by mouth daily.    Dispense:  28 tablet    Refill:  11   RTC to review her progress in 6 months    Scheryl Darter 01/01/2021, 8:40  AM

## 2021-01-01 ENCOUNTER — Ambulatory Visit: Payer: Self-pay | Attending: Internal Medicine

## 2021-01-01 DIAGNOSIS — Z23 Encounter for immunization: Secondary | ICD-10-CM

## 2021-01-01 NOTE — Progress Notes (Signed)
   Covid-19 Vaccination Clinic  Name:  Melanie Rodriguez    MRN: [id number] DOB: [birth year]  01/01/2021  [name] was observed post Covid-19 immunization for 15 minutes without incident. She was provided with Vaccine Information Sheet and instruction to access the V-Safe system.   [name] was instructed to call 911 with any severe reactions post vaccine: Marland Kitchen Difficulty breathing  . Swelling of face and throat  . A fast heartbeat  . A bad rash all over body  . Dizziness and weakness   Immunizations Administered    Name Date Dose VIS Date Route   PFIZER Comrnaty(Gray TOP) Covid-19 Vaccine 01/01/2021  4:34 PM 0.3 mL 10/01/2020 Intramuscular   Manufacturer: [employer], [employer]   Lot: [id number]   NDC: [id number]

## 2021-01-04 LAB — CYTOLOGY - PAP
Comment: NEGATIVE
Diagnosis: UNDETERMINED — AB
High risk HPV: NEGATIVE

## 2021-01-13 MED FILL — PANTOPRAZOLE SOD DR 40 MG T: 40 | 30 days supply | Qty: 30 | Fill #0

## 2021-01-13 MED FILL — MONO-LINYAH 28 TABLET: 0.25-35 | 28 days supply | Qty: 28 | Fill #0

## 2021-01-14 ENCOUNTER — Encounter: Payer: Self-pay | Admitting: Critical Care Medicine

## 2021-01-14 ENCOUNTER — Ambulatory Visit: Payer: Self-pay | Attending: Critical Care Medicine | Admitting: Critical Care Medicine

## 2021-01-14 ENCOUNTER — Other Ambulatory Visit: Payer: Self-pay

## 2021-01-14 DIAGNOSIS — Z91199 Patient's noncompliance with other medical treatment and regimen due to unspecified reason: Secondary | ICD-10-CM

## 2021-01-14 DIAGNOSIS — Z5329 Procedure and treatment not carried out because of patient's decision for other reasons: Secondary | ICD-10-CM

## 2021-01-14 NOTE — Progress Notes (Deleted)
Subjective:    Patient ID: Melanie Rodriguez, female    DOB: 05-Jul-1986, 35 y.o.   MRN: [id number]  35 y.o.F here to est care this visit is accomplished with [employer] interpreter for Spanish 10/27/20 This patient is seen today to establish care and on arrival complains of dizziness and nausea that is intermittent along with chronic headache.  She missed her menstrual period in December and has been having menorrhagia with her menstrual period in November and again starting this month.  [facility] but not [facility] in since [facility].  She states she has episodes of emesis with nausea and severe heartburn.  Sometimes there is blood mixed with the emesis.  She did have a negative home pregnancy test yesterday and on arrival urine pregnancy test here in the clinic is negative.  The patient has had 4 children by vaginal deliveries no miscarriages no abortions.  She still remains sexually active and does not use protection.  She denies any vaginal discharge at this time.  The patient is due a Pap smear and has chronic pelvic pain.  She had an IUD in the past but this has been removed.  She complains of also chronic sinus congestion sinus pressure nasal congestion and postnasal drip along with rhinorrhea  The patient also complains of bloating after eating.  The patient's had hyperglycemia during pregnancy.  She is also a history of hemorrhagic ovarian cyst.  Also had cholestasis of pregnancy in the past.  She is status post appendectomy.   Patient denies any alcohol or tobacco use she had an orange card in the past this is expired she wishes to reapply.  She also notes some itching in the eyes rhinitis and mood changes  12/29/2020 This patient is seen today in return follow-up in this visit was assisted with language barrier using Spanish interpreter Misty Stanley.  This patient does have an upcoming gynecology appointment March 10 with Dr. Debroah Loop for Pap smear and evaluation of  pelvic pain and dysfunctional bleeding.  Patient also has questions about getting labs to clear her for planned liposuction and breast plant implantations.  She is planning to have this done in [place] at a clinic there.  Patient has no other complaints at this time.  Reflux symptoms are improved on Protonix.  The patient has yet to receive her Covid booster and needed information as to how to obtain the booster.  01/14/2021  Gastroesophageal reflux disease with esophagitis and hemorrhage Markedly better on proton pump inhibitor continue same  Menorrhagia with irregular cycle Pending gynecology appointment March 10  Allergic rhinitis Improved with Flonase and antihistamine therapy  I told the patient I would be looking into the plastic surgery clinic she is going to in [place] to make sure it is qualified and I told her I had be willing to get labs for her preop but I need to check the clinic out first  Patient given information to obtain a COVID booster vaccine       Past Medical History:  Diagnosis Date  . 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 Hemorrhagic ovarian cyst 10/03/2011   On CT 09/21/11   . Hyperglycemia 10/2015  . Pelvic abscess 07/2011   Complication of appendicitis--ultimately underwent percutaneous drainag and also had ureteral stent placed temporarily due to obstruction from abscess in right pelvis     Family History  Problem Relation Age of Onset  . Diabetes Mother   . Hearing loss Mother   .  Cancer Father        lymphoma--not sure which type.     Social History   Socioeconomic History  . Marital status: Single    Spouse name: Not on file  . Number of children: 3  . Years of education: 10  . Highest education level: Not on file  Occupational History  . 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 Smoking status: Never Smoker  . Smokeless tobacco: Never Used  Vaping Use  . Vaping Use: Never used  Substance and  Sexual Activity  . Alcohol use: No  . Drug use: No  . Sexual activity: Yes    Birth control/protection: I.U.D.    Comment: Mirena IUD removed June of 2017 due to menorrhagia.    Other Topics Concern  . Not on file  Social History Narrative   Originally from [place].   [place]   Lives at home with her 3 sons and her parents, who are separated, but come to live with her family on and off.   Social Determinants of Health   Financial Resource Strain: Not on file  Food Insecurity: Food Insecurity Present  . Worried About Programme researcher, broadcasting/film/video in the Last Year: Sometimes true  . Ran Out of Food in the Last Year: Never true  Transportation Needs: No Transportation Needs  . Lack of Transportation (Medical): No  . Lack of Transportation (Non-Medical): No  Physical Activity: Not on file  Stress: Not on file  Social Connections: Not on file  Intimate Partner Violence: Not on file     No Known Allergies   Outpatient Medications Prior to Visit  Medication Sig Dispense Refill  . acetaminophen (TYLENOL) 500 MG tablet Take 1,000 mg by mouth every 6 (six) hours as needed.    . fluticasone (FLONASE) 50 MCG/ACT nasal spray Place 2 sprays into both nostrils daily. 16 g 6  . Ibuprofen (MOTRIN PO) Take by mouth.    . norgestimate-ethinyl estradiol (ORTHO-CYCLEN) 0.25-35 MG-MCG tablet Take 1 tablet by mouth daily. 28 tablet 11  . pantoprazole (PROTONIX) 40 MG tablet Take 1 tablet (40 mg total) by mouth daily before breakfast. 30 tablet 3   No facility-administered medications prior to visit.    Review of Systems  Constitutional: Negative.  Negative for fatigue.  HENT: Negative for congestion, dental problem, ear discharge, ear pain, hearing loss, rhinorrhea, sinus pressure, sinus pain, sneezing and sore throat.   Eyes: Negative.  Negative for visual disturbance.  Respiratory: Negative.   Cardiovascular: Negative.   Gastrointestinal: Negative for abdominal pain, blood in stool,  constipation, diarrhea, nausea, rectal pain and vomiting.        GERD improved   Endocrine: Negative.   Genitourinary: Positive for menstrual problem and pelvic pain. Negative for decreased urine volume, difficulty urinating, dyspareunia, dysuria, hematuria, urgency, vaginal bleeding, vaginal discharge and vaginal pain.       Notes heavy menses with bleeding  Musculoskeletal: Negative for back pain.  Skin: Negative for rash.  Neurological: Negative for dizziness, light-headedness and headaches.  Psychiatric/Behavioral: Negative for dysphoric mood, self-injury, sleep disturbance and suicidal ideas. The patient is not nervous/anxious.        Objective:   Physical Exam There were no vitals filed for this visit.  Gen: Pleasant, well-nourished, in no distress,  normal affect  ENT: No lesions,  mouth clear,  oropharynx clear, normal  Neck: No JVD, no TMG, no carotid bruits  Lungs: No use  of accessory muscles, no dullness to percussion, clear without rales or rhonchi  Cardiovascular: RRR, heart sounds normal, no murmur or gallops, no peripheral edema  Abdomen: soft NT no rebound or guarding, no HSM,  BS normal  Musculoskeletal: No deformities, no cyanosis or clubbing  Neuro: alert, non focal  Skin: Warm, no lesions or rashes  No results found.        Assessment & Plan:  I personally reviewed all images and lab data in the [facility] system as well as any outside material available during this office visit and agree with the  radiology impressions.   No problem-specific Assessment & Plan notes found for this encounter.  I told the patient I would be looking into the plastic surgery clinic she is going to in [place] to make sure it is qualified and I told her I had be willing to get labs for her preop but I need to check the clinic out first  Patient given information to obtain a COVID booster vaccine

## 2021-01-14 NOTE — Progress Notes (Signed)
Patient ID: Melanie Rodriguez, female   DOB: December 22, 1985, 35 y.o.   MRN: [id number] The patient was a no-show did not answer the phone

## 2021-01-16 NOTE — Progress Notes (Signed)
Subjective:    Patient ID: Melanie Rodriguez, female    DOB: 08-May-1986, 35 y.o.   MRN: [id number]  35 y.o.F here to est care this visit is accomplished with [employer] interpreter for Spanish 10/27/20 This patient is seen today to establish care and on arrival complains of dizziness and nausea that is intermittent along with chronic headache.  She missed her menstrual period in December and has been having menorrhagia with her menstrual period in November and again starting this month.  [facility] but not [facility] in since 2018.  She states she has episodes of emesis with nausea and severe heartburn.  Sometimes there is blood mixed with the emesis.  She did have a negative home pregnancy test yesterday and on arrival urine pregnancy test here in the clinic is negative.  The patient has had 4 children by vaginal deliveries no miscarriages no abortions.  She still remains sexually active and does not use protection.  She denies any vaginal discharge at this time.  The patient is due a Pap smear and has chronic pelvic pain.  She had an IUD in the past but this has been removed.  She complains of also chronic sinus congestion sinus pressure nasal congestion and postnasal drip along with rhinorrhea  The patient also complains of bloating after eating.  The patient's had hyperglycemia during pregnancy.  She is also a history of hemorrhagic ovarian cyst.  Also had cholestasis of pregnancy in the past.  She is status post appendectomy.   Patient denies any alcohol or tobacco use she had an orange card in the past this is expired she wishes to reapply.  She also notes some itching in the eyes rhinitis and mood changes  12/29/2020 This patient is seen today in return follow-up in this visit was assisted with language barrier using Spanish interpreter Misty Stanley.  This patient does have an upcoming gynecology appointment March 10 with Dr. Debroah Loop for Pap smear and evaluation of  pelvic pain and dysfunctional bleeding.  Patient also has questions about getting labs to clear her for planned liposuction and breast plant implantations.  She is planning to have this done in [place] at a clinic there.  Patient has no other complaints at this time.  Reflux symptoms are improved on Protonix.  The patient has yet to receive her Covid booster and needed information as to how to obtain the booster.  01/18/21 This patient is here for preop clearance for planned liposuction procedure and breast augmentation.  Patient has no specific complaints.  She brings a form from a cosmetic center in [place] the needs to be filled out.  She needs an EKG and a set of labs for this procedure. Patient was just seen by gynecology and had a Pap smear performed started on birth control pills which she is holding until she has a surgical procedure.  Patient has no specific complaints at this time.  Her reflux symptoms are better.  Note EKG is done on arrival and was completely normal.     Past Medical History:  Diagnosis Date  . 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 Hemorrhagic ovarian cyst 10/03/2011   On CT 09/21/11   . Hyperglycemia 10/2015  . Pelvic abscess 07/2011   Complication of appendicitis--ultimately underwent percutaneous drainag and also had ureteral stent placed temporarily due to obstruction from abscess in right pelvis     Family History  Problem Relation Age of Onset  . Diabetes  Mother   . Hearing loss Mother   . Cancer Father        lymphoma--not sure which type.     Social History   Socioeconomic History  . Marital status: Single    Spouse name: Not on file  . Number of children: 3  . Years of education: 10  . Highest education level: Not on file  Occupational History  . 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 Smoking status: Never Smoker  . Smokeless tobacco: Never Used  Vaping Use  . Vaping Use: Never used   Substance and Sexual Activity  . Alcohol use: No  . Drug use: No  . Sexual activity: Yes    Birth control/protection: I.U.D.    Comment: Mirena IUD removed June of 2017 due to menorrhagia.    Other Topics Concern  . Not on file  Social History Narrative   Originally from [place].   [place]   Lives at home with her 3 sons and her parents, who are separated, but come to live with her family on and off.   Social Determinants of Health   Financial Resource Strain: Not on file  Food Insecurity: Food Insecurity Present  . Worried About Programme researcher, broadcasting/film/video in the Last Year: Sometimes true  . Ran Out of Food in the Last Year: Never true  Transportation Needs: No Transportation Needs  . Lack of Transportation (Medical): No  . Lack of Transportation (Non-Medical): No  Physical Activity: Not on file  Stress: Not on file  Social Connections: Not on file  Intimate Partner Violence: Not on file     No Known Allergies   Outpatient Medications Prior to Visit  Medication Sig Dispense Refill  . acetaminophen (TYLENOL) 500 MG tablet Take 1,000 mg by mouth every 6 (six) hours as needed.    . fluticasone (FLONASE) 50 MCG/ACT nasal spray Place 2 sprays into both nostrils daily. 16 g 6  . Ibuprofen (MOTRIN PO) Take by mouth.    . pantoprazole (PROTONIX) 40 MG tablet Take 1 tablet (40 mg total) by mouth daily before breakfast. 30 tablet 3  . norgestimate-ethinyl estradiol (ORTHO-CYCLEN) 0.25-35 MG-MCG tablet Take 1 tablet by mouth daily. (Patient not taking: Reported on 01/18/2021) 28 tablet 11   No facility-administered medications prior to visit.    Review of Systems  Constitutional: Negative.  Negative for fatigue.  HENT: Negative for congestion, dental problem, ear discharge, ear pain, hearing loss, rhinorrhea, sinus pressure, sinus pain, sneezing and sore throat.   Eyes: Negative.  Negative for visual disturbance.  Respiratory: Negative.   Cardiovascular: Negative.    Gastrointestinal: Negative for abdominal pain, blood in stool, constipation, diarrhea, nausea, rectal pain and vomiting.        GERD improved   Endocrine: Negative.   Genitourinary: Positive for menstrual problem. Negative for decreased urine volume, difficulty urinating, dyspareunia, dysuria, hematuria, pelvic pain, urgency, vaginal bleeding, vaginal discharge and vaginal pain.       Notes heavy menses with bleeding  Musculoskeletal: Negative for back pain.  Skin: Negative for rash.  Neurological: Negative for dizziness, light-headedness and headaches.  Psychiatric/Behavioral: Negative for dysphoric mood, self-injury, sleep disturbance and suicidal ideas. The patient is not nervous/anxious.        Objective:   Physical Exam Vitals:   01/18/21 1030  BP: 114/72  Pulse: 65  SpO2: 100%  Weight: 172 lb 12.8 oz (78.4 kg)  Height: 5\' 5"  (1.651  m)    Gen: Pleasant, well-nourished, in no distress,  normal affect  ENT: No lesions,  mouth clear,  oropharynx clear, normal  Neck: No JVD, no TMG, no carotid bruits  Lungs: No use of accessory muscles, no dullness to percussion, clear without rales or rhonchi  Cardiovascular: RRR, heart sounds normal, no murmur or gallops, no peripheral edema  Abdomen: soft NT no rebound or guarding, no HSM,  BS normal  Musculoskeletal: No deformities, no cyanosis or clubbing  Neuro: alert, non focal  Skin: Warm, no lesions or rashes  No results found.        Assessment & Plan:  I personally reviewed all images and lab data in the [facility] system as well as any outside material available during this office visit and agree with the  radiology impressions.   Encounter for preoperative examination for general surgical procedure Patient's physical exam is unremarkable.  EKG showed sinus bradycardia only otherwise normal.  The preop clearance needs to include a metabolic panel blood count qualitative hCG HIV PT PTT INR urinalysis and urine culture we  will obtain all of these and once results are available we will send this with a medical clearance documentation to the cosmetic surgery Center of the patient's request in [place]  The patient is medically cleared for planned liposuction surgery and breast implantation procedures  Gastroesophageal reflux disease with esophagitis and hemorrhage Continue oral proton pump inhibitor   Diagnoses and all orders for this visit:  Encounter for preoperative examination for general surgical procedure -     Comprehensive metabolic panel -     CBC with Differential/Platelet -     Protime-INR -     EKG 12-Lead -     hCG, serum, qualitative -     APTT -     Urinalysis -     Urine Culture -     HIV Antibody (routine testing w rflx)  Preop cardiovascular exam -     EKG 12-Lead  Encounter for screening for HIV -     HIV Antibody (routine testing w rflx)  Gastroesophageal reflux disease with esophagitis and hemorrhage

## 2021-01-18 ENCOUNTER — Other Ambulatory Visit: Payer: Self-pay

## 2021-01-18 ENCOUNTER — Ambulatory Visit: Payer: Self-pay | Attending: Critical Care Medicine | Admitting: Critical Care Medicine

## 2021-01-18 ENCOUNTER — Encounter: Payer: Self-pay | Admitting: Critical Care Medicine

## 2021-01-18 VITALS — BP 114/72 | HR 65 | Ht 65.0 in | Wt 172.8 lb

## 2021-01-18 DIAGNOSIS — R001 Bradycardia, unspecified: Secondary | ICD-10-CM | POA: Insufficient documentation

## 2021-01-18 DIAGNOSIS — Z79899 Other long term (current) drug therapy: Secondary | ICD-10-CM | POA: Insufficient documentation

## 2021-01-18 DIAGNOSIS — Z114 Encounter for screening for human immunodeficiency virus [HIV]: Secondary | ICD-10-CM | POA: Insufficient documentation

## 2021-01-18 DIAGNOSIS — Z5941 Food insecurity: Secondary | ICD-10-CM | POA: Insufficient documentation

## 2021-01-18 DIAGNOSIS — Z01818 Encounter for other preprocedural examination: Secondary | ICD-10-CM | POA: Insufficient documentation

## 2021-01-18 DIAGNOSIS — Z9049 Acquired absence of other specified parts of digestive tract: Secondary | ICD-10-CM | POA: Insufficient documentation

## 2021-01-18 DIAGNOSIS — Z791 Long term (current) use of non-steroidal anti-inflammatories (NSAID): Secondary | ICD-10-CM | POA: Insufficient documentation

## 2021-01-18 DIAGNOSIS — Z793 Long term (current) use of hormonal contraceptives: Secondary | ICD-10-CM | POA: Insufficient documentation

## 2021-01-18 DIAGNOSIS — K2101 Gastro-esophageal reflux disease with esophagitis, with bleeding: Secondary | ICD-10-CM | POA: Insufficient documentation

## 2021-01-18 DIAGNOSIS — Z01810 Encounter for preprocedural cardiovascular examination: Secondary | ICD-10-CM

## 2021-01-18 NOTE — Assessment & Plan Note (Signed)
Patient's physical exam is unremarkable.  EKG showed sinus bradycardia only otherwise normal.  The preop clearance needs to include a metabolic panel blood count qualitative hCG HIV PT PTT INR urinalysis and urine culture we will obtain all of these and once results are available we will send this with a medical clearance documentation to the cosmetic surgery Center of the patient's request in [place]  The patient is medically cleared for planned liposuction surgery and breast implantation procedures

## 2021-01-18 NOTE — Patient Instructions (Signed)
You are medically cleared for your upcoming plastic surgical procedures pending the results of your lab reports  We will call you with your lab results  We will send a copy of the EKG and your lab results and you completed medical clearance report to your surgeon in [place]  No change in medications for now  Return to see Dr. Delford Field 4 months  Est mdicamente autorizado para sus prximos procedimientos quirrgicos plsticos en espera de los resultados de sus informes de laboratorio.  Le llamaremos con los resultados de su laboratorio.  Le enviaremos una copia del electrocardiograma y [place] de su laboratorio y su informe de aprobacin mdica completo a su Designer, multimedia.  No hay cambios en los medicamentos por Environmental education officer a ver al Dr. Delford Field 4 meses

## 2021-01-18 NOTE — Assessment & Plan Note (Signed)
Continue oral proton pump inhibitor

## 2021-01-18 NOTE — Progress Notes (Signed)
Having surgery needs pre op.

## 2021-01-19 LAB — CBC WITH DIFFERENTIAL/PLATELET
Basophils Absolute: 0 10*3/uL (ref 0.0–0.2)
Basos: 1 %
EOS (ABSOLUTE): 0.2 10*3/uL (ref 0.0–0.4)
Eos: 3 %
Hematocrit: 37.9 % (ref 34.0–46.6)
Hemoglobin: 12.7 g/dL (ref 11.1–15.9)
Immature Grans (Abs): 0 10*3/uL (ref 0.0–0.1)
Immature Granulocytes: 0 %
Lymphocytes Absolute: 2.6 10*3/uL (ref 0.7–3.1)
Lymphs: 36 %
MCH: 28.3 pg (ref 26.6–33.0)
MCHC: 33.5 g/dL (ref 31.5–35.7)
MCV: 84 fL (ref 79–97)
Monocytes Absolute: 0.5 10*3/uL (ref 0.1–0.9)
Monocytes: 7 %
Neutrophils Absolute: 3.8 10*3/uL (ref 1.4–7.0)
Neutrophils: 53 %
Platelets: 266 10*3/uL (ref 150–450)
RBC: 4.49 x10E6/uL (ref 3.77–5.28)
RDW: 13.2 % (ref 11.7–15.4)
WBC: 7.2 10*3/uL (ref 3.4–10.8)

## 2021-01-19 LAB — PROTIME-INR
INR: 1 (ref 0.9–1.2)
Prothrombin Time: 10.2 s (ref 9.1–12.0)

## 2021-01-19 LAB — COMPREHENSIVE METABOLIC PANEL
ALT: 32 IU/L (ref 0–32)
AST: 29 IU/L (ref 0–40)
Albumin/Globulin Ratio: 1.4 (ref 1.2–2.2)
Albumin: 4.5 g/dL (ref 3.8–4.8)
Alkaline Phosphatase: 81 IU/L (ref 44–121)
BUN/Creatinine Ratio: 17 (ref 9–23)
BUN: 9 mg/dL (ref 6–20)
Bilirubin Total: 0.6 mg/dL (ref 0.0–1.2)
CO2: 21 mmol/L (ref 20–29)
Calcium: 9.3 mg/dL (ref 8.7–10.2)
Chloride: 105 mmol/L (ref 96–106)
Creatinine, Ser: 0.54 mg/dL — ABNORMAL LOW (ref 0.57–1.00)
Globulin, Total: 3.3 g/dL (ref 1.5–4.5)
Glucose: 84 mg/dL (ref 65–99)
Potassium: 4.1 mmol/L (ref 3.5–5.2)
Sodium: 140 mmol/L (ref 134–144)
Total Protein: 7.8 g/dL (ref 6.0–8.5)
eGFR: 122 mL/min/{1.73_m2} (ref >59)

## 2021-01-19 LAB — URINALYSIS
Bilirubin, UA: NEGATIVE
Glucose, UA: NEGATIVE
Ketones, UA: NEGATIVE
Nitrite, UA: NEGATIVE
Protein,UA: NEGATIVE
RBC, UA: NEGATIVE
Specific Gravity, UA: 1.02 (ref 1.005–1.030)
Urobilinogen, Ur: 0.2 mg/dL (ref 0.2–1.0)
pH, UA: 6 (ref 5.0–7.5)

## 2021-01-19 LAB — HCG, SERUM, QUALITATIVE: hCG,Beta Subunit,Qual,Serum: NEGATIVE m[IU]/mL (ref <6)

## 2021-01-19 LAB — APTT: aPTT: 28 s (ref 24–33)

## 2021-01-19 LAB — HIV ANTIBODY (ROUTINE TESTING W REFLEX): HIV Screen 4th Generation wRfx: NONREACTIVE

## 2021-01-20 LAB — URINE CULTURE

## 2021-01-22 ENCOUNTER — Telehealth: Payer: Self-pay

## 2021-01-22 NOTE — Telephone Encounter (Signed)
-----   Message from Storm Frisk, MD sent at 01/20/2021  8:34 AM EDT ----- Urine culture neg,   pls send this to the clinic in [facility]

## 2021-01-22 NOTE — Telephone Encounter (Signed)
-----   Message from Storm Frisk, MD sent at 01/19/2021  6:39 AM EDT ----- Let pt know all labs normal.  Will be printing labs off and attaching to med clearance form and sending to the provider in [place]

## 2021-01-22 NOTE — Telephone Encounter (Signed)
Patient name and DOB has been verified Patient was informed of lab results. Patient had no questions.  

## 2021-01-22 NOTE — Telephone Encounter (Signed)
-----   Message from Storm Frisk, MD sent at 01/19/2021  8:38 AM EDT ----- Pls print all labs done yesterday

## 2021-01-26 ENCOUNTER — Telehealth: Payer: Self-pay

## 2021-01-26 NOTE — Telephone Encounter (Signed)
There is no indication for this unless it is preop for her breast reduction surgery in which case her surgeon in [place] should be ordering that

## 2021-01-26 NOTE — Telephone Encounter (Signed)
Copied from CRM [id number]. Topic: General - Inquiry >> Jan 26, 2021  9:53 AM Elliot Gault wrote: Reason for CRM: patient requesting breast ultrasound orders and would like a follow up call when orders have been placed.

## 2021-02-03 ENCOUNTER — Telehealth: Payer: Self-pay | Admitting: Critical Care Medicine

## 2021-02-03 DIAGNOSIS — Z01818 Encounter for other preprocedural examination: Secondary | ICD-10-CM

## 2021-02-03 NOTE — Telephone Encounter (Signed)
Patient was called and given number to schedule ultrasound.

## 2021-02-03 NOTE — Telephone Encounter (Signed)
A Dr Darene Lamer from [facility] wanting a preop breast u/s.  I told pt I would order as a favor to her

## 2021-02-08 ENCOUNTER — Other Ambulatory Visit: Payer: Self-pay | Admitting: Critical Care Medicine

## 2021-05-02 NOTE — Progress Notes (Signed)
Subjective:    Patient ID: Melanie Rodriguez, female    DOB: [birth year], 35 y.o.   MRN: [id number]  35 y.o.F here to est care this visit is accomplished with [employer] interpreter for Spanish 10/27/20 This patient is seen today to establish care and on arrival complains of dizziness and nausea that is intermittent along with chronic headache.  She missed her menstrual period in December and has been having menorrhagia with her menstrual period in November and again starting this month.  She is a [facility] but not seen in since 2018.  She states she has episodes of emesis with nausea and severe heartburn.  Sometimes there is blood mixed with the emesis.  She did have a negative home pregnancy test yesterday and on arrival urine pregnancy test here in the clinic is negative.  The patient has had 4 children by vaginal deliveries no miscarriages no abortions.  She still remains sexually active and does not use protection.  She denies any vaginal discharge at this time.  The patient is due a Pap smear and has chronic pelvic pain.  She had an IUD in the past but this has been removed.  She complains of also chronic sinus congestion sinus pressure nasal congestion and postnasal drip along with rhinorrhea  The patient also complains of bloating after eating.  The patient's had hyperglycemia during pregnancy.  She is also a history of hemorrhagic ovarian cyst.  Also had cholestasis of pregnancy in the past.  She is status post appendectomy.   Patient denies any alcohol or tobacco use she had an orange card in the past this is expired she wishes to reapply.  She also notes some itching in the eyes rhinitis and mood changes  12/29/2020 This patient is seen today in return follow-up in this visit was assisted with language barrier using Spanish interpreter Misty Stanley.  This patient does have an upcoming gynecology appointment March 10 with Dr. Debroah Loop for Pap smear and evaluation of  pelvic pain and dysfunctional bleeding.  Patient also has questions about getting labs to clear her for planned liposuction and breast plant implantations.  She is planning to have this done in [place] at a clinic there.  Patient has no other complaints at this time.  Reflux symptoms are improved on Protonix.  The patient has yet to receive her Covid booster and needed information as to how to obtain the booster.  01/18/21 This patient is here for preop clearance for planned liposuction procedure and breast augmentation.  Patient has no specific complaints.  She brings a form from a cosmetic center in [place] the needs to be filled out.  She needs an EKG and a set of labs for this procedure. Patient was just seen by gynecology and had a Pap smear performed started on birth control pills which she is holding until she has a surgical procedure.  Patient has no specific complaints at this time.  Her reflux symptoms are better.  Note EKG is done on arrival and was completely normal.  7/11 Patient returns in follow-up and she did undergo breast augmentation procedure in [place] did well with this she is requesting today a dental referral Patient's reflux symptoms are improved    Past Medical History:  Diagnosis Date   Cholestasis of pregnancy 05/29/2017   Hemorrhagic ovarian cyst 10/03/2011   On CT 09/21/11    Hyperglycemia 10/2015   Pelvic abscess 07/2011   Complication of appendicitis--ultimately underwent percutaneous  drainag and also had ureteral stent placed temporarily due to obstruction from abscess in right pelvis     Family History  Problem Relation Age of Onset   Diabetes Mother    Hearing loss Mother    Cancer Father        lymphoma--not sure which type.     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    Comment: Mirena IUD removed June of 2017 due to menorrhagia.    Other Topics Concern   Not on file  Social History Narrative   Originally from [place].   [place]   Lives at home with her 3 sons and her parents, who are separated, but come to live with her family on and off.   Social Determinants of Health   Financial Resource Strain: Not on file  Food Insecurity: Food Insecurity Present   Worried About Running Out of Food in the Last Year: Sometimes true   Ran Out of Food in the Last Year: Never true  Transportation Needs: No Transportation Needs   Lack of Transportation (Medical): No   Lack of Transportation (Non-Medical): No  Physical Activity: Not on file  Stress: Not on file  Social Connections: Not on file  Intimate Partner Violence: Not on file     No Known Allergies   Outpatient Medications Prior to Visit  Medication Sig Dispense Refill   acetaminophen (TYLENOL) 500 MG tablet Take 1,000 mg by mouth every 6 (six) hours as needed.     fluticasone (FLONASE) 50 MCG/ACT nasal spray PLACE 2 SPRAYS INTO BOTH NOSTRILS DAILY. 16 g 6   Ibuprofen (MOTRIN PO) Take by mouth.     norgestimate-ethinyl estradiol (ORTHO-CYCLEN) 0.25-35 MG-MCG tablet TAKE 1 TABLET BY MOUTH DAILY. 28 tablet 11   pantoprazole (PROTONIX) 40 MG tablet TAKE 1 TABLET (40 MG TOTAL) BY MOUTH DAILY BEFORE BREAKFAST. (Patient not taking: Reported on 05/03/2021) 30 tablet 3   No facility-administered medications prior to visit.    Review of Systems  HENT:  Positive for dental problem.   Respiratory: Negative.    Gastrointestinal:  Positive for abdominal distention and abdominal pain.       Gerd   Genitourinary:  Negative for pelvic pain.  Musculoskeletal:  Positive for gait problem.      Objective:   Physical Exam Vitals:   05/03/21 1530  BP: 111/68  Pulse: 68  SpO2: 99%  Weight: 170 lb (77.1 kg)  Height: 5\' 5"  (1.651 m)    Gen: Pleasant, well-nourished, in no distress,  normal affect  ENT: No lesions,  mouth clear,  oropharynx clear, normal  Neck: No JVD, no TMG, no carotid bruits  Lungs: No use of accessory muscles, no dullness to percussion, clear without rales or rhonchi evidence of breast augmentation seen  Cardiovascular: RRR, heart sounds normal, no murmur or gallops, no peripheral edema  Abdomen: soft NT no rebound or guarding, no HSM,  BS normal  Musculoskeletal: No deformities, no cyanosis or clubbing  Neuro: alert, non focal  Skin: Warm, no lesions or rashes  No results found.        Assessment & Plan:  I personally reviewed all images and lab data in the [facility] system as well as any outside material available during this office visit and agree with the  radiology impressions.   S/P breast augmentation Patient's had successful breast augmentation without complications  Allergic rhinitis Continue Flonase  Gastroesophageal reflux disease with esophagitis and hemorrhage Given patient reflux diet continue proton pump inhibitor  Menorrhagia with irregular cycle Patient is on birth control pills still having irregularities in menses have asked the patient to return to gynecology  Periodontal disease Referral to dentistry made   Luther was seen today for gastroesophageal reflux.  Diagnoses and all orders for this visit:  Gastroesophageal reflux disease with esophagitis and hemorrhage  Periodontal disease -     Ambulatory referral to Dentistry  S/P breast augmentation  Allergic rhinitis, unspecified seasonality, unspecified trigger  Menorrhagia with irregular cycle

## 2021-05-03 ENCOUNTER — Other Ambulatory Visit: Payer: Self-pay

## 2021-05-03 ENCOUNTER — Encounter: Payer: Self-pay | Admitting: Critical Care Medicine

## 2021-05-03 ENCOUNTER — Ambulatory Visit: Payer: Self-pay | Attending: Critical Care Medicine | Admitting: Critical Care Medicine

## 2021-05-03 VITALS — BP 111/68 | HR 68 | Ht 65.0 in | Wt 170.0 lb

## 2021-05-03 DIAGNOSIS — Z9882 Breast implant status: Secondary | ICD-10-CM | POA: Insufficient documentation

## 2021-05-03 DIAGNOSIS — N921 Excessive and frequent menstruation with irregular cycle: Secondary | ICD-10-CM

## 2021-05-03 DIAGNOSIS — J309 Allergic rhinitis, unspecified: Secondary | ICD-10-CM

## 2021-05-03 DIAGNOSIS — K2101 Gastro-esophageal reflux disease with esophagitis, with bleeding: Secondary | ICD-10-CM

## 2021-05-03 DIAGNOSIS — K056 Periodontal disease, unspecified: Secondary | ICD-10-CM | POA: Insufficient documentation

## 2021-05-03 NOTE — Assessment & Plan Note (Signed)
Patient is on birth control pills still having irregularities in menses have asked the patient to return to gynecology

## 2021-05-03 NOTE — Assessment & Plan Note (Signed)
Referral to dentistry made 

## 2021-05-03 NOTE — Assessment & Plan Note (Signed)
-   Continue Flonase  °

## 2021-05-03 NOTE — Assessment & Plan Note (Signed)
Patient's had successful breast augmentation without complications

## 2021-05-03 NOTE — Patient Instructions (Signed)
No change in medications you have refills on all your medicines at our pharmacy  Please reconnect with Dr. Debroah Loop for another appointment in gynecology  Follow a healthy diet as outlined on the attachment to improve your digestion  A referral to dentistry will be made [facility] on [address]  Return to see Dr. Delford Field 6 months  No hay cambios en los medicamentos que tiene recargas en todos sus medicamentos en nuestra farmacia  Vuelva a conectarse con el Dr. Debroah Loop para otra cita en ginecologa.  Siga una dieta saludable como se describe en el archivo adjunto para mejorar su digestin.  Se har una remisin a la [facility] [place] en [employer] [address]  Volver a ver al Dr. Delford Field 6 meses

## 2021-05-03 NOTE — Assessment & Plan Note (Signed)
Given patient reflux diet continue proton pump inhibitor

## 2021-05-10 ENCOUNTER — Ambulatory Visit: Payer: Self-pay

## 2021-05-17 ENCOUNTER — Ambulatory Visit: Payer: Self-pay | Attending: Critical Care Medicine

## 2021-05-17 ENCOUNTER — Other Ambulatory Visit: Payer: Self-pay

## 2021-06-02 ENCOUNTER — Telehealth: Payer: Self-pay | Admitting: Critical Care Medicine

## 2021-06-02 NOTE — Telephone Encounter (Signed)
Pt was sent a letter from financial dept. Inform them, that the application they submitted was incomplete, since they were missing some documentation at the time of the appointment, Pt need to reschedule and resubmit all new papers and application for [employer] and [employer], P.S. old documents has been sent back by mail to the Pt and Pt. need to make a new appt. 

## 2021-10-08 ENCOUNTER — Telehealth: Payer: Self-pay | Admitting: Critical Care Medicine

## 2021-10-08 NOTE — Telephone Encounter (Signed)
Due to no encounters I was unable to figure out who and why they called

## 2021-10-08 NOTE — Telephone Encounter (Signed)
Copied from CRM [id number]. Topic: General - Other >> Oct 07, 2021 11:19 AM Gaetana Michaelis A wrote: Reason for CRM: The patient received a missed phone call from the office  There were no open notes or encounters at the time of call with agent  Please contact further when possible

## 2021-10-08 NOTE — Telephone Encounter (Signed)
I return Pt call, schedule a financial appt 10/13/21 °

## 2021-10-13 ENCOUNTER — Ambulatory Visit: Payer: Self-pay | Attending: Critical Care Medicine

## 2021-10-13 ENCOUNTER — Other Ambulatory Visit: Payer: Self-pay

## 2021-11-03 ENCOUNTER — Ambulatory Visit: Payer: Self-pay | Admitting: Critical Care Medicine

## 2021-11-10 ENCOUNTER — Telehealth: Payer: Self-pay | Admitting: Critical Care Medicine

## 2021-11-10 ENCOUNTER — Other Ambulatory Visit: Payer: Self-pay

## 2021-11-10 ENCOUNTER — Ambulatory Visit: Payer: Self-pay | Attending: Critical Care Medicine | Admitting: Critical Care Medicine

## 2021-11-10 ENCOUNTER — Encounter: Payer: Self-pay | Admitting: Critical Care Medicine

## 2021-11-10 VITALS — BP 128/85 | HR 63 | Resp 16 | Wt 172.6 lb

## 2021-11-10 DIAGNOSIS — K2101 Gastro-esophageal reflux disease with esophagitis, with bleeding: Secondary | ICD-10-CM

## 2021-11-10 DIAGNOSIS — K056 Periodontal disease, unspecified: Secondary | ICD-10-CM

## 2021-11-10 DIAGNOSIS — R102 Pelvic and perineal pain: Secondary | ICD-10-CM

## 2021-11-10 DIAGNOSIS — Z9882 Breast implant status: Secondary | ICD-10-CM

## 2021-11-10 DIAGNOSIS — F4323 Adjustment disorder with mixed anxiety and depressed mood: Secondary | ICD-10-CM

## 2021-11-10 DIAGNOSIS — N921 Excessive and frequent menstruation with irregular cycle: Secondary | ICD-10-CM

## 2021-11-10 DIAGNOSIS — Z23 Encounter for immunization: Secondary | ICD-10-CM

## 2021-11-10 NOTE — Assessment & Plan Note (Signed)
Stable this time continue Protonix refill sent

## 2021-11-10 NOTE — Patient Instructions (Addendum)
Keep appointment with the breast reconstructive surgeon at [facility] as we discussed  Referral to [facility] was made for dental care  Referral back to Dr. Debroah Loop or gynecology was made  Stay on pantoprazole for now on your birth control medications refills are available  You received a flu vaccine this visit  Return to Dr. Delford Field 3 months  Referral to our clinical social worker [place] was made for behavioral therapy  Mantenga una cita con el cirujano reconstructivo de senos en [place] como discutimos  Se hizo una remisin al servicio [facility] [place] para [facility] dental  Se hizo una remisin al Dr. Debroah Loop o al gineclogo  Contine con pantoprazol por ahora en sus medicamentos anticonceptivos. Hay renovaciones disponibles.  Recibi una vacuna contra la influenza en esta visita  Regreso al Dr. Delford Field 3 meses  Se hizo referencia a nuestro trabajador social clnico Asante para terapia conductual.

## 2021-11-10 NOTE — Progress Notes (Signed)
Established Patient Office Visit  Subjective:  Patient ID: Melanie Rodriguez, female    DOB: 01-08-1986  Age: 36 y.o. MRN: [id number]  CC: fatigue , breast pain  HPI Melanie Rodriguez presents for primary care follow-up.  Spanish interpretation provided by [employer] interpreter [phone].  On arrival blood pressure is 128/85.  Patient complains of chronic fatigue pulsations in the left breast and cramping the breast after having bilateral silicone surgical implants placed earlier in 2022 in [place].  She feels hot tired and fatigue as well.  There is no evidence that she has had a leak yet in the implants.  She is yet to achieve a gynecology follow-up for menstrual irregularities and menorrhagia.  She is on birth control medications have not been successful in changing the course of those symptoms.  She does have reflux symptoms on a proton pump inhibitor only at this time doing well with this.  She did except and did receive the flu vaccine at this visit.     Past Surgical History:  Procedure Laterality Date   APPENDECTOMY  08/12/2011   s/p abscess w/drain -drain d/c 12/27   CYSTOSCOPY  10/24/2011   Procedure: CYSTOSCOPY;  Surgeon: Hanley Ben, MD;  Location: [facility];  Service: Urology;  Laterality: Right;  cysto and stent placement c arm    Family History  Problem Relation Age of Onset   Diabetes Mother    Hearing loss Mother    Cancer Father        lymphoma--not sure which type.    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    Comment: Mirena IUD removed June of 2017 due to menorrhagia.    Other Topics Concern   Not on file  Social History Narrative   Originally from [place].   Came to [place]. In 2006   Lives at home with her 3 sons and her parents, who are separated, but come to live with her family on and off.   Social Determinants of Health   Financial Resource Strain: Not on file  Food Insecurity: Food Insecurity Present   Worried About [place] in the Last Year: Sometimes true   Ran Out of Food in the Last Year: Never true  Transportation Needs: No Transportation Needs   Lack of Transportation (Medical): No   Lack of Transportation (Non-Medical): No  Physical Activity: Not on file  Stress: Not on file  Social Connections: Not on file  Intimate Partner Violence: Not on file    Outpatient Medications Prior to Visit  Medication Sig Dispense Refill   norgestimate-ethinyl estradiol (ORTHO-CYCLEN) 0.25-35 MG-MCG tablet TAKE 1 TABLET BY MOUTH DAILY. 28 tablet 11   pantoprazole (PROTONIX) 40 MG tablet TAKE 1 TABLET (40 MG TOTAL) BY MOUTH DAILY BEFORE BREAKFAST. 30 tablet 3   acetaminophen (TYLENOL) 500 MG tablet Take 1,000 mg by mouth every 6 (six) hours as needed. (Patient not taking: Reported  on 11/10/2021)     fluticasone (FLONASE) 50 MCG/ACT nasal spray PLACE 2 SPRAYS INTO BOTH NOSTRILS DAILY. 16 g 6   Ibuprofen (MOTRIN PO) Take by mouth. (Patient not taking: Reported on 11/10/2021)     No facility-administered medications prior to visit.    No Known Allergies  ROS Review of Systems  Constitutional:  Negative for chills, diaphoresis and fever.  HENT:  Negative for congestion, hearing loss, nosebleeds, sore throat and tinnitus.   Eyes:  Negative for photophobia and redness.  Respiratory:  Negative for cough, shortness of breath, wheezing and stridor.        Pulsation on left breast and hypersensitivity on nipple  Cardiovascular:   Negative for chest pain, palpitations and leg swelling.  Gastrointestinal:  Negative for abdominal pain, blood in stool, constipation, diarrhea, nausea and vomiting.  Endocrine: Negative for polydipsia.  Genitourinary:  Positive for dyspareunia, menstrual problem, vaginal bleeding and vaginal discharge. Negative for dysuria, flank pain, frequency, hematuria and urgency.       Menorrhagia Still irreg menses  Musculoskeletal:  Negative for back pain, myalgias and neck pain.  Skin:  Negative for rash.  Allergic/Immunologic: Negative for environmental allergies.  Neurological:  Negative for dizziness, tremors, seizures, weakness and headaches.  Hematological:  Does not bruise/bleed easily.  Psychiatric/Behavioral:  Positive for dysphoric mood. Negative for suicidal ideas. The patient is not nervous/anxious.        Feels guilty due to the procedure, feels depressed, not needed. And constant headache     Objective:    Physical Exam Vitals reviewed.  Constitutional:      Appearance: Normal appearance. She is well-developed. She is obese. She is not diaphoretic.  HENT:     Head: Normocephalic and atraumatic.     Nose: No nasal deformity, septal deviation, mucosal edema or rhinorrhea.     Right Sinus: No maxillary sinus tenderness or frontal sinus tenderness.     Left Sinus: No maxillary sinus tenderness or frontal sinus tenderness.     Mouth/Throat:     Pharynx: No oropharyngeal exudate.  Eyes:     General: No scleral icterus.    Conjunctiva/sclera: Conjunctivae normal.     Pupils: Pupils are equal, round, and reactive to light.  Neck:     Thyroid: No thyromegaly.     Vascular: No carotid bruit or JVD.     Trachea: Trachea normal. No tracheal tenderness or tracheal deviation.  Cardiovascular:     Rate and Rhythm: Normal rate and regular rhythm.     Chest Wall: PMI is not displaced.     Pulses: Normal pulses. No decreased pulses.     Heart sounds: Normal heart sounds, S1 normal and S2  normal. Heart sounds not distant. No murmur heard. No systolic murmur is present.  No diastolic murmur is present.    No friction rub. No gallop. No S3 or S4 sounds.  Pulmonary:     Effort: No tachypnea, accessory muscle usage or respiratory distress.     Breath sounds: No stridor. No decreased breath sounds, wheezing, rhonchi or rales.  Chest:     Chest wall: No tenderness.  Breasts:    Right: No swelling, bleeding, inverted nipple, mass, nipple discharge, skin change or tenderness.     Left: No swelling, bleeding, inverted nipple, mass, nipple discharge, skin change or tenderness.     Comments: Silicone breast implants are easily palpable in both breast and did not appear to be deflated or leaking Abdominal:     General: Bowel  sounds are normal. There is no distension.     Palpations: Abdomen is soft. Abdomen is not rigid.     Tenderness: There is no abdominal tenderness. There is no guarding or rebound.  Musculoskeletal:        General: Normal range of motion.     Cervical back: Normal range of motion and neck supple. No edema, erythema or rigidity. No muscular tenderness. Normal range of motion.  Lymphadenopathy:     Head:     Right side of head: No submental or submandibular adenopathy.     Left side of head: No submental or submandibular adenopathy.     Cervical: No cervical adenopathy.  Skin:    General: Skin is warm and dry.     Coloration: Skin is not pale.     Findings: No rash.     Nails: There is no clubbing.  Neurological:     Mental Status: She is alert and oriented to person, place, and time.     Sensory: No sensory deficit.  Psychiatric:        Attention and Perception: Attention and perception normal.        Mood and Affect: Mood is depressed.        Speech: Speech normal.        Behavior: Behavior normal.        Thought Content: Thought content normal.        Cognition and Memory: Cognition and memory normal.        Judgment: Judgment normal.    BP 128/85     Pulse 63    Resp 16    Wt 172 lb 9.6 oz (78.3 kg)    SpO2 98%    BMI 28.72 kg/m  Wt Readings from Last 3 Encounters:  11/10/21 172 lb 9.6 oz (78.3 kg)  05/03/21 170 lb (77.1 kg)  01/18/21 172 lb 12.8 oz (78.4 kg)     Health Maintenance Due  Topic Date Due   COVID-19 Vaccine (4 - Booster for Pfizer series) 02/26/2021    There are no preventive care reminders to display for this patient.  Lab Results  Component Value Date   TSH 1.816 12/24/2014   Lab Results  Component Value Date   WBC 7.2 01/18/2021   HGB 12.7 01/18/2021   HCT 37.9 01/18/2021   MCV 84 01/18/2021   PLT 266 01/18/2021   Lab Results  Component Value Date   NA 140 01/18/2021   K 4.1 01/18/2021   CO2 21 01/18/2021   GLUCOSE 84 01/18/2021   BUN 9 01/18/2021   CREATININE 0.54 (L) 01/18/2021   BILITOT 0.6 01/18/2021   ALKPHOS 81 01/18/2021   AST 29 01/18/2021   ALT 32 01/18/2021   PROT 7.8 01/18/2021   ALBUMIN 4.5 01/18/2021   CALCIUM 9.3 01/18/2021   EGFR 122 01/18/2021   Lab Results  Component Value Date   CHOL 182 10/06/2016   Lab Results  Component Value Date   HDL 36 (L) 10/06/2016   Lab Results  Component Value Date   LDLCALC 77 10/06/2016   Lab Results  Component Value Date   TRIG 346 (H) 10/06/2016   No results found for: CHOLHDL Lab Results  Component Value Date   HGBA1C 5.3 10/06/2016      Assessment & Plan:   Problem List Items Addressed This Visit       Digestive   Gastroesophageal reflux disease with esophagitis and hemorrhage    Stable this time continue Protonix  refill sent      Periodontal disease    Plan dental referral not she has the orange card      Relevant Orders   Ambulatory referral to Dentistry     Other   Menorrhagia with irregular cycle    Persistent menorrhagia plan will be for the patient to be referred to gynecology for evaluation       Relevant Orders   Ambulatory referral to Gynecology   Pelvic pain   Relevant Orders   Ambulatory  referral to Gynecology   S/P breast augmentation    Patient appears to be having potential silicone implant syndrome plan for this patient is to be referred to a breast implant surgeon for evaluation of any complications of current implants      Adjustment reaction with anxiety and depression    Patient is very anxious and depressed over the fact that she undertook the breast implants and has had a poor outcome  Referral to clinical social work made      Other Visit Diagnoses     Need for immunization against influenza    -  Primary   Relevant Orders   Flu Vaccine QUAD 53moIM (Fluarix, Fluzone & Alfiuria Quad PF) (Completed)       No orders of the defined types were placed in this encounter.   Follow-up: Return in about 3 months (around 02/08/2022).    PAsencion Noble MD

## 2021-11-10 NOTE — Assessment & Plan Note (Signed)
Plan dental referral not she has the orange card

## 2021-11-10 NOTE — Assessment & Plan Note (Signed)
Patient appears to be having potential silicone implant syndrome plan for this patient is to be referred to a breast implant surgeon for evaluation of any complications of current implants

## 2021-11-10 NOTE — Assessment & Plan Note (Signed)
Persistent menorrhagia plan will be for the patient to be referred to gynecology for evaluation

## 2021-11-10 NOTE — Assessment & Plan Note (Signed)
Patient is very anxious and depressed over the fact that she undertook the breast implants and has had a poor outcome  Referral to clinical social work made

## 2021-11-10 NOTE — Telephone Encounter (Signed)
This patient is suffering from grief loss and regret over having undergone a breast augmentation silicone implant in [place].  She paid cash for this.  She is having lots of some implant syndrome symptoms and may want these removed again.  She has quite a bit of depression and anxiety over this can you please see her for this condition I do not think she needs medication she is not suicidal

## 2021-12-27 NOTE — Telephone Encounter (Signed)
I attempted to call pt via spanish interpreter [phone]) no answer, left vm.

## 2022-01-14 NOTE — Telephone Encounter (Signed)
I attempted to reach pt again, no answer, left vm.

## 2022-02-08 ENCOUNTER — Encounter: Payer: Self-pay | Admitting: Critical Care Medicine

## 2022-02-08 ENCOUNTER — Other Ambulatory Visit: Payer: Self-pay

## 2022-02-08 ENCOUNTER — Ambulatory Visit: Payer: Self-pay | Attending: Critical Care Medicine | Admitting: Critical Care Medicine

## 2022-02-08 VITALS — BP 122/82 | HR 67 | Wt 177.6 lb

## 2022-02-08 DIAGNOSIS — N921 Excessive and frequent menstruation with irregular cycle: Secondary | ICD-10-CM

## 2022-02-08 DIAGNOSIS — K056 Periodontal disease, unspecified: Secondary | ICD-10-CM

## 2022-02-08 DIAGNOSIS — F4323 Adjustment disorder with mixed anxiety and depressed mood: Secondary | ICD-10-CM

## 2022-02-08 DIAGNOSIS — R102 Pelvic and perineal pain: Secondary | ICD-10-CM

## 2022-02-08 MED ORDER — FLUTICASONE PROPIONATE 50 MCG/ACT NA SUSP
2.0000 | Freq: Every day | NASAL | 6 refills | Status: DC
  Filled 2022-02-08: qty 16, 30d supply, fill #0

## 2022-02-08 MED ORDER — NORGESTIMATE-ETH ESTRADIOL 0.25-35 MG-MCG PO TABS: 1.0000 | ORAL_TABLET | Freq: Every day | ORAL | 11 refills | Status: DC

## 2022-02-08 MED ORDER — FLUTICASONE PROPIONATE 50 MCG/ACT NA SUSP: 2.0000 | Freq: Every day | NASAL | 6 refills | Status: DC

## 2022-02-08 MED ORDER — PANTOPRAZOLE SODIUM 40 MG PO TBEC: DELAYED_RELEASE_TABLET | ORAL | 3 refills | Status: DC

## 2022-02-08 MED ORDER — PANTOPRAZOLE SODIUM 40 MG PO TBEC
DELAYED_RELEASE_TABLET | ORAL | 3 refills | Status: DC
  Filled 2022-02-08: qty 30, 30d supply, fill #0

## 2022-02-08 MED ORDER — NORGESTIMATE-ETH ESTRADIOL 0.25-35 MG-MCG PO TABS
1.0000 | ORAL_TABLET | Freq: Every day | ORAL | 11 refills | Status: DC
Start: 2022-02-08 — End: 2022-12-09
  Filled 2022-02-08: qty 28, 28d supply, fill #0

## 2022-02-08 NOTE — Patient Instructions (Addendum)
Refills on medications sent to our pharmacy ? ?Referral back to gynecology will be made again ? ?Keep your appointment in June with dental ? ?Return to see Dr. Delford Field 6 months ? ? ?Resurtidos de medicamentos enviados a nuestra farmacia ? ?Se volver? a derivar a ginecolog?a ? ?Acude a tu cita en junio con dental ? ?Volver a ver al Dr. Delford Field 6 meses ? ?

## 2022-02-08 NOTE — Assessment & Plan Note (Signed)
As per menorrhagia assessment 

## 2022-02-08 NOTE — Assessment & Plan Note (Signed)
The patient is more comfortable with her breast implants and is not having any further symptoms from the implants she does not wish to have them removed because it is too expensive ?

## 2022-02-08 NOTE — Assessment & Plan Note (Signed)
Patient has existing appointment with dental ?

## 2022-02-08 NOTE — Progress Notes (Signed)
? ?Established Patient Office Visit ? ?Subjective:  ?Patient ID: Melanie Rodriguez, female    DOB: [birth year]  Age: 36 y.o. MRN: [id number] ? ?CC: Breast pain follow-up ? ?HPI ?11/10/21 ?Melanie Rodriguez presents for primary care follow-up.  Spanish interpretation provided by [employer] interpreter [phone].  On arrival blood pressure is 128/85.  Patient complains of chronic fatigue pulsations in the left breast and cramping the breast after having bilateral silicone surgical implants placed earlier in 2022 in [place].  She feels hot tired and fatigue as well.  There is no evidence that she has had a leak yet in the implants.  She is yet to achieve a gynecology follow-up for menstrual irregularities and menorrhagia.  She is on birth control medications have not been successful in changing the course of those symptoms.  She does have reflux symptoms on a proton pump inhibitor only at this time doing well with this.  She did except and did receive the flu vaccine at this visit. ?  ?02/08/2022 this visit was assisted by video interpreter Isabelle Course for Spanish [phone] ?Primary care follow-up of breast pain she presented for in January of this year.  She had previously had surgical implants in 2022 for breast augmentation.  She has since seen plastic surgery and the expense of having the implants removed was too high for the patient to afford as she is self-pay.  She is actually becoming more comfortable with the implants and is not having further symptoms at this time from them.  Patient is on birth control medications started by Dr. Debroah Loop in March 2022 for menorrhagia with regular menstrual cycle.  She is yet to get her return visit with Dr. Debroah Loop.  She still having irregular periods and excess bleeding when she has the periods.  She had a heavy period in January and then another 129 days ago. ? ?Referral was given in January but she has not received a phone call.  She does have a dental appointment  coming up in June.  She has no other complaints at this time. ? ?Past Surgical History:  ?Procedure Laterality Date  ? APPENDECTOMY  08/12/2011  ? s/p abscess w/drain -drain d/c 12/27  ? CYSTOSCOPY  10/24/2011  ? Procedure: CYSTOSCOPY;  Surgeon: Lindaann Slough, MD;  Location: [facility];  Service: Urology;  Laterality: Right;  cysto and stent placement c arm  ? ? ?Family History  ?Problem Relation Age of Onset  ? Diabetes Mother   ? Hearing loss Mother   ? Cancer Father   ?     lymphoma--not sure which type.  ? ? ?Social History  ? ?Socioeconomic History  ? Marital status: Single  ?  Spouse name: Not on file  ? Number of children: 3  ? Years of education: 10  ? Highest education level: Not on file  ?Occupational History  ? 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 Smoking status: Never  ? Smokeless tobacco: Never  ?Vaping Use  ? Vaping Use: Never used  ?Substance and Sexual Activity  ? Alcohol use: No  ? Drug use: No  ? Sexual activity: Yes  ?  Birth control/protection: I.U.D.  ?  Comment: Mirena IUD removed June of 2017 due to menorrhagia.    ?Other Topics Concern  ? Not on file  ?Social History Narrative  ? Originally from [place].  ? [place] to [place]  ? Lives at home with her 3 sons  and her parents, who are separated, but come to live with her family on and off.  ? ?Social Determinants of Health  ? ?Financial Resource Strain: Not on file  ?Food Insecurity: Not on file  ?Transportation Needs: Not on file  ?Physical Activity: Not on file  ?Stress: Not on file  ?Social Connections: Not on file  ?Intimate Partner Violence: Not on file  ? ? ?Outpatient Medications Prior to Visit  ?Medication Sig Dispense Refill  ? fluticasone (FLONASE) 50 MCG/ACT nasal spray PLACE 2 SPRAYS INTO BOTH NOSTRILS DAILY. 16 g 6  ? norgestimate-ethinyl estradiol (ORTHO-CYCLEN) 0.25-35 MG-MCG tablet TAKE 1 TABLET BY MOUTH DAILY. 28 tablet 11  ? pantoprazole (PROTONIX) 40 MG tablet  TAKE 1 TABLET (40 MG TOTAL) BY MOUTH DAILY BEFORE BREAKFAST. 30 tablet 3  ? acetaminophen (TYLENOL) 500 MG tablet Take 1,000 mg by mouth every 6 (six) hours as needed. (Patient not taking: Reported on 11/10/2021)    ? Ibuprofen (MOTRIN PO) Take by mouth. (Patient not taking: Reported on 11/10/2021)    ? ?No facility-administered medications prior to visit.  ? ? ?No Known Allergies ? ?ROS ?Review of Systems  ?Constitutional:  Negative for chills, diaphoresis and fever.  ?HENT:  Negative for congestion, hearing loss, nosebleeds, sore throat and tinnitus.   ?Eyes:  Negative for photophobia and redness.  ?Respiratory:  Negative for cough, shortness of breath, wheezing and stridor.   ?     Pulsation on left breast and hypersensitivity on nipple  ?Cardiovascular:  Negative for chest pain, palpitations and leg swelling.  ?Gastrointestinal:  Negative for abdominal pain, blood in stool, constipation, diarrhea, nausea and vomiting.  ?Endocrine: Negative for polydipsia.  ?Genitourinary:  Positive for menstrual problem, vaginal bleeding and vaginal discharge. Negative for dyspareunia, dysuria, flank pain, frequency, hematuria and urgency.  ?     Menorrhagia ?Still irreg menses  ?Musculoskeletal:  Negative for back pain, myalgias and neck pain.  ?Skin:  Negative for rash.  ?Allergic/Immunologic: Negative for environmental allergies.  ?Neurological:  Negative for dizziness, tremors, seizures, weakness and headaches.  ?Hematological:  Does not bruise/bleed easily.  ?Psychiatric/Behavioral:  Negative for dysphoric mood and suicidal ideas. The patient is not nervous/anxious.   ? ?  ?Objective:  ?  ?Physical Exam ?Vitals reviewed.  ?Constitutional:   ?   Appearance: Normal appearance. She is well-developed. She is obese. She is not diaphoretic.  ?HENT:  ?   Head: Normocephalic and atraumatic.  ?   Nose: No nasal deformity, septal deviation, mucosal edema or rhinorrhea.  ?   Right Sinus: No maxillary sinus tenderness or frontal sinus  tenderness.  ?   Left Sinus: No maxillary sinus tenderness or frontal sinus tenderness.  ?   Mouth/Throat:  ?   Pharynx: No oropharyngeal exudate.  ?Eyes:  ?   General: No scleral icterus. ?   Conjunctiva/sclera: Conjunctivae normal.  ?   Pupils: Pupils are equal, round, and reactive to light.  ?Neck:  ?   Thyroid: No thyromegaly.  ?   Vascular: No carotid bruit or JVD.  ?   Trachea: Trachea normal. No tracheal tenderness or tracheal deviation.  ?Cardiovascular:  ?   Rate and Rhythm: Normal rate and regular rhythm.  ?   Chest Wall: PMI is not displaced.  ?   Pulses: Normal pulses. No decreased pulses.  ?   Heart sounds: Normal heart sounds, S1 normal and S2 normal. Heart sounds not distant. No murmur heard. ?No systolic murmur is present.  ?No diastolic murmur is present.  ?  No friction rub. No gallop. No S3 or S4 sounds.  ?Pulmonary:  ?   Effort: No tachypnea, accessory muscle usage or respiratory distress.  ?   Breath sounds: No stridor. No decreased breath sounds, wheezing, rhonchi or rales.  ?Chest:  ?   Chest wall: No tenderness.  ?Breasts: ?   Right: No swelling, bleeding, inverted nipple, mass, nipple discharge, skin change or tenderness.  ?   Left: No swelling, bleeding, inverted nipple, mass, nipple discharge, skin change or tenderness.  ?Abdominal:  ?   General: Bowel sounds are normal. There is no distension.  ?   Palpations: Abdomen is soft. Abdomen is not rigid.  ?   Tenderness: There is no abdominal tenderness. There is no guarding or rebound.  ?Musculoskeletal:     ?   General: Normal range of motion.  ?   Cervical back: Normal range of motion and neck supple. No edema, erythema or rigidity. No muscular tenderness. Normal range of motion.  ?Lymphadenopathy:  ?   Head:  ?   Right side of head: No submental or submandibular adenopathy.  ?   Left side of head: No submental or submandibular adenopathy.  ?   Cervical: No cervical adenopathy.  ?Skin: ?   General: Skin is warm and dry.  ?   Coloration: Skin  is not pale.  ?   Findings: No rash.  ?   Nails: There is no clubbing.  ?Neurological:  ?   Mental Status: She is alert and oriented to person, place, and time.  ?   Sensory: No sensory deficit.  ?Psychiat

## 2022-02-08 NOTE — Assessment & Plan Note (Signed)
Continue with current birth control medications however will have her referred to gynecology ?

## 2022-02-14 ENCOUNTER — Other Ambulatory Visit: Payer: Self-pay

## 2022-03-28 ENCOUNTER — Institutional Professional Consult (permissible substitution): Payer: Self-pay | Admitting: Plastic Surgery

## 2022-04-25 ENCOUNTER — Institutional Professional Consult (permissible substitution): Payer: Self-pay | Admitting: Plastic Surgery

## 2022-06-29 ENCOUNTER — Ambulatory Visit: Payer: Self-pay | Admitting: *Deleted

## 2022-06-29 NOTE — Telephone Encounter (Signed)
  Chief Complaint: covid sx. Interpreter Melanie Rodriguez ID# [phone] assist with call. Symptoms: chest discomfort, dizziness, blurred vision, headache, nausea, chills. Shortness of breath pressure in chest, feels like burning in chest . Pain in bones  Frequency: started last night  Pertinent Negatives: Patient denies chest pain now but reports pressure in chest.  Disposition: [x] ED /[] Urgent Care (no appt availability in office) / [] Appointment(In office/virtual)/ []  [place]/ [] Home Care/ [] Refused Recommended Disposition /[] [place] [facility]/ []  Follow-up with PCP Additional Notes:   Patient has not taken covid test at this time.     Reason for Disposition  Chest pain or pressure  (Exception: MILD central chest pain, present only when coughing.)  Answer Assessment - Initial Assessment Questions 1. COVID-19 DIAGNOSIS: "How do you know that you have COVID?" (e.g., positive lab test or self-test, diagnosed by doctor or NP/PA, symptoms after exposure).     Has not taken at home test at this time  2. COVID-19 EXPOSURE: "Was there any known exposure to COVID before the symptoms began?" CDC Definition of close contact: within 6 feet (2 meters) for a total of 15 minutes or more over a 24-hour period.      na 3. ONSET: "When did the COVID-19 symptoms start?"      Yesterday night  4. WORST SYMPTOM: "What is your worst symptom?" (e.g., cough, fever, shortness of breath, muscle aches)     Pain in bones, chest discomfort, dizziness, blurred vision, headache, chills, shortness of breath  5. COUGH: "Do you have a cough?" If Yes, ask: "How bad is the cough?"       na 6. FEVER: "Do you have a fever?" If Yes, ask: "What is your temperature, how was it measured, and when did it start?"     na 7. RESPIRATORY STATUS: "Describe your breathing?" (e.g., normal; shortness of breath, wheezing, unable to speak)      *No Answer* 8. BETTER-SAME-WORSE: "Are you getting better, staying the same or getting  worse compared to yesterday?"  If getting worse, ask, "In what way?"     Sounds worse  9. OTHER SYMPTOMS: "Do you have any other symptoms?"  (e.g., chills, fatigue, headache, loss of smell or taste, muscle pain, sore throat)     See above  10. HIGH RISK DISEASE: "Do you have any chronic medical problems?" (e.g., asthma, heart or lung disease, weak immune system, obesity, etc.)       na 11. VACCINE: "Have you had the COVID-19 vaccine?" If Yes, ask: "Which one, how many shots, when did you get it?"       na 12. PREGNANCY: "Is there any chance you are pregnant?" "When was your last menstrual period?"       na 13. O2 SATURATION MONITOR:  "Do you use an oxygen saturation monitor (pulse oximeter) at home?" If Yes, ask "What is your reading (oxygen level) today?" "What is your usual oxygen saturation reading?" (e.g., 95%)       na  Protocols used: Coronavirus (COVID-19) Diagnosed or Suspected-A-AH

## 2022-08-10 ENCOUNTER — Ambulatory Visit: Payer: Self-pay | Admitting: Physician Assistant

## 2022-08-10 ENCOUNTER — Ambulatory Visit: Payer: Self-pay | Admitting: Critical Care Medicine

## 2022-08-18 ENCOUNTER — Ambulatory Visit: Payer: Self-pay | Admitting: Physician Assistant

## 2022-09-21 ENCOUNTER — Ambulatory Visit: Payer: Self-pay | Admitting: Physician Assistant

## 2022-10-19 ENCOUNTER — Encounter: Payer: Self-pay | Admitting: Physician Assistant

## 2022-10-19 ENCOUNTER — Ambulatory Visit: Payer: Self-pay | Attending: Critical Care Medicine | Admitting: Physician Assistant

## 2022-10-19 VITALS — BP 148/89 | HR 61 | Wt 175.6 lb

## 2022-10-19 DIAGNOSIS — N921 Excessive and frequent menstruation with irregular cycle: Secondary | ICD-10-CM

## 2022-10-19 DIAGNOSIS — Z603 Acculturation difficulty: Secondary | ICD-10-CM

## 2022-10-19 DIAGNOSIS — R030 Elevated blood-pressure reading, without diagnosis of hypertension: Secondary | ICD-10-CM

## 2022-10-19 DIAGNOSIS — Z789 Other specified health status: Secondary | ICD-10-CM

## 2022-10-19 DIAGNOSIS — R42 Dizziness and giddiness: Secondary | ICD-10-CM

## 2022-10-19 LAB — POCT URINE PREGNANCY: Preg Test, Ur: NEGATIVE

## 2022-10-19 NOTE — Progress Notes (Signed)
Patient ID: Melanie Rodriguez, female   DOB: 07-26-1986, 36 y.o.   MRN: [id number]     Melanie Rodriguez, is a 36 y.o. female  [id number]  [id number]  DOB - 05-18-1986  Chief Complaint  Patient presents with   Menorrhagia   Dizziness       Subjective:   Melanie Rodriguez is a 36 y.o. female here today heavy and irregular periods. Previously taking OCP rx by Dr Delford Field for about 2 months.  She did not feel like they helped.  Last took them 3 years ago.    C/o heavy periods.  LMP 09/12/2022.  Lasted 7 days and very heavy.  Clots.  Only heavy about 4/7 days.  No period in December yet.  October period was normal and around 08/12/2022.  She is having occasional dizziness.  No vision changes.  No HA.  No urinary s/sx.    Antonio  with [employer] interpreting.    No problems updated.  ALLERGIES: No Known Allergies  PAST MEDICAL HISTORY: Past Medical History:  Diagnosis Date   Cholestasis of pregnancy 05/29/2017   Hemorrhagic ovarian cyst 10/03/2011   On CT 09/21/11    Hyperglycemia 10/2015   Pelvic abscess 07/2011   Complication of appendicitis--ultimately underwent percutaneous drainag and also had ureteral stent placed temporarily due to obstruction from abscess in right pelvis    MEDICATIONS AT HOME: Prior to Admission medications   Medication Sig Start Date End Date Taking? Authorizing Provider  acetaminophen (TYLENOL) 500 MG tablet Take 1,000 mg by mouth every 6 (six) hours as needed. Patient not taking: Reported on 11/10/2021    [provider]  fluticasone (FLONASE) 50 MCG/ACT nasal spray PLACE 2 SPRAYS INTO BOTH NOSTRILS DAILY. Patient not taking: Reported on 10/19/2022 02/08/22 02/08/23  Storm Frisk, MD  Ibuprofen (MOTRIN PO) Take by mouth. Patient not taking: Reported on 11/10/2021    [provider]  norgestimate-ethinyl estradiol (ORTHO-CYCLEN) 0.25-35 MG-MCG tablet TAKE 1 TABLET BY MOUTH DAILY. Patient not taking: Reported  on 10/19/2022 02/08/22 02/08/23  Storm Frisk, MD  pantoprazole (PROTONIX) 40 MG tablet TAKE 1 TABLET (40 MG TOTAL) BY MOUTH DAILY BEFORE BREAKFAST. Patient not taking: Reported on 10/19/2022 02/08/22 02/08/23  Storm Frisk, MD    ROS: Neg HEENT Neg resp Neg cardiac Neg GI Neg MS Neg psych Neg neuro  Objective:   Vitals:   10/19/22 0935  BP: (!) 145/93  Pulse: 61  SpO2: 97%  Weight: 175 lb 9.6 oz (79.7 kg)   Exam General appearance : Awake, alert, not in any distress. Speech Clear. Not toxic looking HEENT: Atraumatic and Normocephalic Neck: Supple, no JVD. No cervical lymphadenopathy.  Chest: Good air entry bilaterally, CTAB.  No rales/rhonchi/wheezing CVS: S1 S2 regular, no murmurs.  Extremities: B/L Lower Ext shows no edema, both legs are warm to touch Neurology: Awake alert, and oriented X 3, CN II-XII intact, Non focal Skin: No Rash  Data Review Lab Results  Component Value Date   HGBA1C 5.3 10/06/2016    Assessment & Plan   1. Menorrhagia with irregular cycle - CBC with Differential/Platelet - TSH - Iron, TIBC and Ferritin Panel - POCT urine pregnancy  2. Dizziness Drink 80-100 ounces water daily.  - Ambulatory referral to Gynecology - CBC with Differential/Platelet - TSH - Iron, TIBC and Ferritin Panel  3. Elevated blood pressure reading Check BP 3 to 5 times weekly.  If numbers are consistently >130/85, please schedule and appt in 6 weeks  4.  Language barrier [employer] "Antonio" interpreters used and additional time performing visit was required.     Return in about 6 months (around 04/20/2023) for PCP for chronic conditions.  The patient was given clear instructions to go to ER or return to medical center if symptoms don't improve, worsen or new problems develop. The patient verbalized understanding. The patient was told to call to get lab results if they haven't heard anything in the next week.      Georgian Co, PA-C [facility] and [facility] [place], [place] [phone]   10/19/2022, 9:51 AM

## 2022-10-19 NOTE — Patient Instructions (Addendum)
Drink 80-100 ounces water daily.    Check BP 3 to 5 times weekly.  If numbers are consistently >130/85, please schedule and appt in 6 weeks

## 2022-10-20 ENCOUNTER — Other Ambulatory Visit: Payer: Self-pay | Admitting: Physician Assistant

## 2022-10-20 DIAGNOSIS — R7989 Other specified abnormal findings of blood chemistry: Secondary | ICD-10-CM

## 2022-10-20 DIAGNOSIS — E611 Iron deficiency: Secondary | ICD-10-CM

## 2022-10-20 LAB — IRON,TIBC AND FERRITIN PANEL
Ferritin: 16 ng/mL (ref 15–150)
Iron Saturation: 6 % — CL (ref 15–55)
Iron: 30 ug/dL (ref 27–159)
Total Iron Binding Capacity: 480 ug/dL — ABNORMAL HIGH (ref 250–450)
UIBC: 450 ug/dL — ABNORMAL HIGH (ref 131–425)

## 2022-10-20 LAB — CBC WITH DIFFERENTIAL/PLATELET
Basophils Absolute: 0 10*3/uL (ref 0.0–0.2)
Basos: 0 %
EOS (ABSOLUTE): 0.3 10*3/uL (ref 0.0–0.4)
Eos: 5 %
Hematocrit: 37.1 % (ref 34.0–46.6)
Hemoglobin: 11.9 g/dL (ref 11.1–15.9)
Immature Grans (Abs): 0 10*3/uL (ref 0.0–0.1)
Immature Granulocytes: 0 %
Lymphocytes Absolute: 3 10*3/uL (ref 0.7–3.1)
Lymphs: 44 %
MCH: 26.9 pg (ref 26.6–33.0)
MCHC: 32.1 g/dL (ref 31.5–35.7)
MCV: 84 fL (ref 79–97)
Monocytes Absolute: 0.6 10*3/uL (ref 0.1–0.9)
Monocytes: 8 %
Neutrophils Absolute: 3 10*3/uL (ref 1.4–7.0)
Neutrophils: 43 %
Platelets: 276 10*3/uL (ref 150–450)
RBC: 4.43 x10E6/uL (ref 3.77–5.28)
RDW: 12 % (ref 11.7–15.4)
WBC: 6.9 10*3/uL (ref 3.4–10.8)

## 2022-10-20 LAB — TSH: TSH: 5.92 u[IU]/mL — ABNORMAL HIGH (ref 0.450–4.500)

## 2022-10-20 MED ORDER — LEVOTHYROXINE SODIUM 25 MCG PO TABS: 25.0000 ug | ORAL_TABLET | Freq: Every day | ORAL | 3 refills | Status: DC

## 2022-10-20 MED ORDER — IRON (FERROUS SULFATE) 325 (65 FE) MG PO TABS: 325.0000 mg | ORAL_TABLET | Freq: Every day | ORAL | 1 refills | Status: DC

## 2022-12-09 ENCOUNTER — Ambulatory Visit: Payer: Self-pay

## 2022-12-09 ENCOUNTER — Ambulatory Visit (HOSPITAL_COMMUNITY)
Admission: EM | Admit: 2022-12-09 | Discharge: 2022-12-09 | Disposition: A | Discharge status: Home / Self Care | Payer: Self-pay | Attending: Internal Medicine | Admitting: Internal Medicine

## 2022-12-09 ENCOUNTER — Encounter (HOSPITAL_COMMUNITY): Payer: Self-pay

## 2022-12-09 DIAGNOSIS — B029 Zoster without complications: Secondary | ICD-10-CM

## 2022-12-09 MED ORDER — METHYLPREDNISOLONE 4 MG PO TBPK: ORAL_TABLET | ORAL | 0 refills | Status: DC

## 2022-12-09 MED ORDER — VALACYCLOVIR HCL 1 G PO TABS: 1000.0000 mg | ORAL_TABLET | Freq: Three times a day (TID) | ORAL | 0 refills | Status: AC

## 2022-12-09 NOTE — ED Triage Notes (Signed)
Pt is here for a rash on stomach and back x 8days causing pain and discomfort.

## 2022-12-09 NOTE — Telephone Encounter (Signed)
Noted.  Patient ED.

## 2022-12-09 NOTE — ED Provider Notes (Signed)
[place]    CSN: [id number] Arrival date & time: 12/09/22  1114      History   Chief Complaint Chief Complaint  Patient presents with   Rash    HPI Melanie Rodriguez is a 37 y.o. female who presents with rash onset of R mid thorax one week ago, and new one on back today. She had been feeling a tingle and burning sensation before the rash. She admits of being stressed. The pain is moderate and had to take Tylenol pm to sleep one night which helped.    Past Medical History:  Diagnosis Date   Cholestasis of pregnancy 05/29/2017   Hemorrhagic ovarian cyst 10/03/2011   On CT 09/21/11    Hyperglycemia 10/2015   Pelvic abscess [id number]   Complication of appendicitis--ultimately underwent percutaneous drainag and also had ureteral stent placed temporarily due to obstruction from abscess in right pelvis    Patient Active Problem List   Diagnosis Date Noted   Adjustment reaction with anxiety and depression 11/10/2021   S/P breast augmentation 05/03/2021   Periodontal disease 05/03/2021   Menorrhagia with irregular cycle 10/27/2020   Pelvic pain 10/27/2020   Gastroesophageal reflux disease with esophagitis and hemorrhage 10/27/2020   Allergic rhinitis 10/27/2020    Past Surgical History:  Procedure Laterality Date   APPENDECTOMY  08/12/2011   s/p abscess w/drain -drain d/c 12/27   CYSTOSCOPY  10/24/2011   Procedure: CYSTOSCOPY;  Surgeon: Hanley Ben, MD;  Location: [facility];  Service: Urology;  Laterality: Right;  cysto and stent placement c arm    OB History     Gravida  4   Para  4   Term  4   Preterm      AB      Living  4      SAB      IAB      Ectopic      Multiple  0   Live Births  4            Home Medications    Prior to Admission medications   Medication Sig Start Date End Date Taking? Authorizing Provider  Iron, Ferrous Sulfate, 325 (65 Fe) MG TABS Take 325 mg by mouth daily. 10/20/22  Yes  Argentina Donovan, PA-C  levothyroxine (SYNTHROID) 25 MCG tablet Take 1 tablet (25 mcg total) by mouth daily. 10/20/22  Yes Freeman Caldron M, PA-C  methylPREDNISolone (MEDROL DOSEPAK) 4 MG TBPK tablet Take as directed per pack 12/09/22  Yes Rodriguez-Southworth, Sunday Spillers, PA-C  valACYclovir (VALTREX) 1000 MG tablet Take 1 tablet (1,000 mg total) by mouth 3 (three) times daily for 7 days. 12/09/22 12/16/22 Yes Rodriguez-Southworth, Sunday Spillers, PA-C    Family History Family History  Problem Relation Age of Onset   Diabetes Mother    Hearing loss Mother    Cancer Father        lymphoma--not sure which type.    Social History Social History   Tobacco Use   Smoking status: Never   Smokeless tobacco: Never  Vaping Use   Vaping Use: Never used  Substance Use Topics   Alcohol use: No   Drug use: No     Allergies   Patient has no known allergies.   Review of Systems Review of Systems  Constitutional:  Negative for fever.  Skin:  Positive for rash.     Physical Exam Triage Vital Signs ED Triage Vitals  Enc Vitals Group     BP  12/09/22 1153 (!) 147/87     Pulse Rate 12/09/22 1153 79     Resp 12/09/22 1153 16     Temp 12/09/22 1153 98 F (36.7 C)     Temp Source 12/09/22 1153 Oral     SpO2 12/09/22 1153 98 %     Weight --      Height --      Head Circumference --      Peak Flow --      Pain Score 12/09/22 1151 6     Pain Loc --      Pain Edu? --      Excl. in [place]? --    No data found.  Updated Vital Signs BP (!) 147/87 (BP Location: Right Arm)   Pulse 79   Temp 98 F (36.7 C) (Oral)   Resp 16   SpO2 98%   Visual Acuity Right Eye Distance:   Left Eye Distance:   Bilateral Distance:    Right Eye Near:   Left Eye Near:    Bilateral Near:     Physical Exam Vitals and nursing note reviewed.  Constitutional:      General: She is not in acute distress.    Appearance: She is obese. She is not toxic-appearing.  HENT:     Right Ear: External ear normal.     Left  Ear: External ear normal.  Eyes:     General: No scleral icterus.    Conjunctiva/sclera: Conjunctivae normal.  Musculoskeletal:        General: Normal range of motion.     Cervical back: Neck supple.  Skin:    Findings: Rash present.     Comments: Has cluster of vesicles on R anterior rib area below breast, and another faint patch of slightly erythematous base of papules around the same dermatome T7  Neurological:     Mental Status: She is alert and oriented to person, place, and time.     Gait: Gait normal.  Psychiatric:        Mood and Affect: Mood normal.        Behavior: Behavior normal.        Thought Content: Thought content normal.        Judgment: Judgment normal.      UC Treatments / Results  Labs (all labs ordered are listed, but only abnormal results are displayed) Labs Reviewed - No data to display  EKG   Radiology No results found.  Procedures Procedures (including critical care time)  Medications Ordered in UC Medications - No data to display  Initial Impression / Assessment and Plan / UC Course  I have reviewed the triage vital signs and the nursing notes.  Herpes zoster  I placed her on Valtrex and Medrol as noted. May take Tylenol prn pain.   Final Clinical Impressions(s) / UC Diagnoses   Final diagnoses:  Herpes zoster without complication     Discharge Instructions      Puede tomar Tylenol 500 mg -1000 mg cada 6 horas para el dolor     ED Prescriptions     Medication Sig Dispense Auth. Provider   valACYclovir (VALTREX) 1000 MG tablet Take 1 tablet (1,000 mg total) by mouth 3 (three) times daily for 7 days. 21 tablet Rodriguez-Southworth, Harriett Azar, PA-C   methylPREDNISolone (MEDROL DOSEPAK) 4 MG TBPK tablet Take as directed per pack 21 tablet Rodriguez-Southworth, Sunday Spillers, PA-C      PDMP not reviewed this encounter.   Rodriguez-Southworth, Sunday Spillers, Hershal Coria 12/09/22  1207  

## 2022-12-09 NOTE — Discharge Instructions (Signed)
Puede tomar Tylenol 500 mg -1000 mg cada 6 horas para el dolor

## 2022-12-09 NOTE — Telephone Encounter (Signed)
  Chief Complaint: 3 areas where the skin has spots, slightly painful.  Symptoms: Above Frequency: 8 days Pertinent Negatives: Patient denies Fever Disposition: []$ ED /[x]$ Urgent Care (no appt availability in office) / []$ Appointment(In office/virtual)/ []$  [facility]/ []$ Home Care/ []$ Refused Recommended Disposition /[]$ [place] [facility]/ []$  Follow-up with PCP Additional Notes: PT states that she has 3 areas on her body where there are spots, that sort of look like pimples. 1 is the size of a dime, another the size of a quarter and the other is a strip. Pt reports chills and dry mouth. Pt will go to UC for care.    Summary: Spots on back and under breast   Pt is calling to report she has spots on her back and both breast, with dull pain. With dry mouth. Pt would like interpreter. Please advise     Reason for Disposition  2 or more boils  Answer Assessment - Initial Assessment Questions 1. APPEARANCE of BOIL: "What does the boil look like?"      Pimples 2. LOCATION: "Where is the boil located?"      Breast and back - below bras  on back. 3. NUMBER: "How many boils are there?"      3 4. SIZE: "How big is the boil?" (e.g., inches, cm; compare to size of a coin or other object)     1 the size of a dime, 1 the size of a quarter, the last is a stripe  5. ONSET: "When did the boil start?"     8 days ago 6. PAIN: "Is there any pain?" If Yes, ask: "How bad is the pain?"   (Scale 1-10; or mild, moderate, severe)     Mild 7. FEVER: "Do you have a fever?" If Yes, ask: "What is it, how was it measured, and when did it start?"      Chills 8. SOURCE: "Have you been around anyone with boils or other Staph infections?" "Have you ever had boils before?"     no 9. OTHER SYMPTOMS: "Do you have any other symptoms?" (e.g., shaking chills, weakness, rash elsewhere on body)     chills 10. PREGNANCY: "Is there any chance you are pregnant?" "When was your last menstrual period?"  Protocols  used: Boil (Skin Abscess)-A-AH

## 2022-12-24 NOTE — Progress Notes (Unsigned)
Patient ID: Melanie Rodriguez, female   DOB: [birth year], 37 y.o.   MRN: [id number]  12/27/22  Melanie Rodriguez is a 37 y.o. female here today heavy and irregular periods. Previously taking OCP rx by Dr Joya Gaskins for about 2 months.  She did not feel like they helped.  Last took them 3 years ago.    C/o heavy periods.  LMP 09/12/2022.  Lasted 7 days and very heavy.  Clots.  Only heavy about 4/7 days.  No period in December yet.  October period was normal and around 08/12/2022.  She is having occasional dizziness.  No vision changes.  No HA.  No urinary s/sx.    Antonio  with [employer] interpreting.   Data Review Lab Results  Component Value Date   HGBA1C 5.3 10/06/2016    Assessment & Plan   1. Menorrhagia with irregular cycle - CBC with Differential/Platelet - TSH - Iron, TIBC and Ferritin Panel - POCT urine pregnancy  2. Dizziness Drink 80-100 ounces water daily.  - Ambulatory referral to Gynecology - CBC with Differential/Platelet - TSH - Iron, TIBC and Ferritin Panel  3. Elevated blood pressure reading Check BP 3 to 5 times weekly.  If numbers are consistently >130/85, please schedule and appt in 6 weeks  4. Language barrier [employer] "Antonio" interpreters used and additional time performing visit was required.

## 2022-12-27 ENCOUNTER — Encounter: Payer: Self-pay | Admitting: Critical Care Medicine

## 2022-12-27 ENCOUNTER — Ambulatory Visit: Payer: Self-pay | Attending: Critical Care Medicine | Admitting: Critical Care Medicine

## 2022-12-27 VITALS — BP 130/78 | HR 97 | Ht 63.0 in | Wt 172.6 lb

## 2022-12-27 DIAGNOSIS — Z9882 Breast implant status: Secondary | ICD-10-CM

## 2022-12-27 DIAGNOSIS — B029 Zoster without complications: Secondary | ICD-10-CM | POA: Insufficient documentation

## 2022-12-27 DIAGNOSIS — N921 Excessive and frequent menstruation with irregular cycle: Secondary | ICD-10-CM

## 2022-12-27 DIAGNOSIS — Z23 Encounter for immunization: Secondary | ICD-10-CM

## 2022-12-27 DIAGNOSIS — R7989 Other specified abnormal findings of blood chemistry: Secondary | ICD-10-CM | POA: Insufficient documentation

## 2022-12-27 NOTE — Assessment & Plan Note (Signed)
Plastic surgery saw the patient and remove the implant she is doing well now

## 2022-12-27 NOTE — Assessment & Plan Note (Signed)
Will reassess thyroid function and continue with low-dose Synthroid

## 2022-12-27 NOTE — Assessment & Plan Note (Signed)
Encouraged to keep appointment with gynecology

## 2022-12-27 NOTE — Patient Instructions (Signed)
Flu vaccine was given  Lab today include thyroid function  Stay on thyroid pill for now  Keep upcoming gynecology appointment  Return to Dr. Joya Gaskins 5 months

## 2022-12-27 NOTE — Assessment & Plan Note (Signed)
Involving patchy areas of the thorax improving rapidly no further changes

## 2022-12-27 NOTE — Progress Notes (Signed)
Established Patient Office Visit  Subjective   Patient ID: Melanie Rodriguez, female    DOB: [birth year]  Age: 37 y.o. MRN: [id number]  Chief Complaint  Patient presents with   Hospitalization Follow-up     12/27/22 This visit assisted with Spanish interpreter Verdis Frederickson [phone] Patient seen in return follow-up last seen in December by Mcclung see below 10/19/22 saw Thereasa Solo Kaylissa Conley is a 37 y.o. female here today heavy and irregular periods. Previously taking OCP rx by Dr Joya Gaskins for about 2 months.  She did not feel like they helped.  Last took them 3 years ago.    C/o heavy periods.  LMP 09/12/2022.  Lasted 7 days and very heavy.  Clots.  Only heavy about 4/7 days.  No period in December yet.  October period was normal and around 08/12/2022.  She is having occasional dizziness.  No vision changes.  No HA.  No urinary s/sx.    Antonio  with AM[employer] interpreting.   Data Review Lab Results Component Value Date  HGBA1C 5.3 10/06/2016   Assessment & Plan  1. Menorrhagia with irregular cycle - CBC with Differential/Platelet - TSH - Iron, TIBC and Ferritin Panel - POCT urine pregnancy  2. Dizziness Drink 80-100 ounces water daily.  - Ambulatory referral to Gynecology - CBC with Differential/Platelet - TSH - Iron, TIBC and Ferritin Panel  3. Elevated blood pressure reading Check BP 3 to 5 times weekly.  If numbers are consistently >130/85, please schedule and appt in 6 weeks  The patient then was in the ER recently with shingles she received a course of steroids and antiviral now is improved.  Patient has a follow-up gynecology visit in April and patient encouraged to keep.  Has hypothyroidism elevated TSH A1c is 6.7 patient needs a flu vaccine and agrees to receive.  Now is on low-dose Synthroid.      Review of Systems  Constitutional:  Negative for chills, diaphoresis, fever, malaise/fatigue and weight loss.  HENT:  Negative for congestion, hearing loss,  nosebleeds, sore throat and tinnitus.   Eyes:  Negative for blurred vision, photophobia and redness.  Respiratory:  Negative for cough, hemoptysis, sputum production, shortness of breath, wheezing and stridor.   Cardiovascular:  Negative for chest pain, palpitations, orthopnea, claudication, leg swelling and PND.  Gastrointestinal:  Negative for abdominal pain, blood in stool, constipation, diarrhea, heartburn, nausea and vomiting.  Genitourinary:  Negative for dysuria, flank pain, frequency, hematuria and urgency.  Musculoskeletal:  Negative for back pain, falls, joint pain, myalgias and neck pain.  Skin:  Positive for rash. Negative for itching.  Neurological:  Negative for dizziness, tingling, tremors, sensory change, speech change, focal weakness, seizures, loss of consciousness, weakness and headaches.  Endo/Heme/Allergies:  Negative for environmental allergies and polydipsia. Does not bruise/bleed easily.  Psychiatric/Behavioral:  Negative for depression, memory loss, substance abuse and suicidal ideas. The patient is not nervous/anxious and does not have insomnia.       Objective:     BP 130/78   Pulse 97   Ht '5\' 3"'$  (1.6 m)   Wt 172 lb 9.6 oz (78.3 kg)   SpO2 97%   BMI 30.57 kg/m    Physical Exam Vitals reviewed.  Constitutional:      Appearance: Normal appearance. She is well-developed. She is not diaphoretic.  HENT:     Head: Normocephalic and atraumatic.     Nose: No nasal deformity, septal deviation, mucosal edema or rhinorrhea.     Right Sinus: No  maxillary sinus tenderness or frontal sinus tenderness.     Left Sinus: No maxillary sinus tenderness or frontal sinus tenderness.     Mouth/Throat:     Pharynx: No oropharyngeal exudate.  Eyes:     General: No scleral icterus.    Conjunctiva/sclera: Conjunctivae normal.     Pupils: Pupils are equal, round, and reactive to light.  Neck:     Thyroid: No thyromegaly.     Vascular: No carotid bruit or JVD.     Trachea:  Trachea normal. No tracheal tenderness or tracheal deviation.  Cardiovascular:     Rate and Rhythm: Normal rate and regular rhythm.     Chest Wall: PMI is not displaced.     Pulses: Normal pulses. No decreased pulses.     Heart sounds: Normal heart sounds, S1 normal and S2 normal. Heart sounds not distant. No murmur heard.    No systolic murmur is present.     No diastolic murmur is present.     No friction rub. No gallop. No S3 or S4 sounds.  Pulmonary:     Effort: No tachypnea, accessory muscle usage or respiratory distress.     Breath sounds: No stridor. No decreased breath sounds, wheezing, rhonchi or rales.  Chest:     Chest wall: No tenderness.  Abdominal:     General: Bowel sounds are normal. There is no distension.     Palpations: Abdomen is soft. Abdomen is not rigid.     Tenderness: There is no abdominal tenderness. There is no guarding or rebound.  Musculoskeletal:        General: Normal range of motion.     Cervical back: Normal range of motion and neck supple. No edema, erythema or rigidity. No muscular tenderness. Normal range of motion.  Lymphadenopathy:     Head:     Right side of head: No submental or submandibular adenopathy.     Left side of head: No submental or submandibular adenopathy.     Cervical: No cervical adenopathy.  Skin:    General: Skin is warm and dry.     Coloration: Skin is not pale.     Findings: Rash present.     Nails: There is no clubbing.  Neurological:     Mental Status: She is alert and oriented to person, place, and time.     Sensory: No sensory deficit.  Psychiatric:        Speech: Speech normal.        Behavior: Behavior normal.      No results found for any visits on 12/27/22.    The ASCVD Risk score (Arnett DK, et al., 2019) failed to calculate for the following reasons:   The 2019 ASCVD risk score is only valid for ages 80 to 79    Assessment & Plan:   Problem List Items Addressed This Visit       Other    Menorrhagia with irregular cycle    Encouraged to keep appointment with gynecology      S/P breast augmentation    Plastic surgery saw the patient and remove the implant she is doing well now      Shingles    Involving patchy areas of the thorax improving rapidly no further changes      Elevated TSH - Primary    Will reassess thyroid function and continue with low-dose Synthroid      Relevant Orders   Thyroid Panel With TSH   Other Visit Diagnoses  Need for immunization against influenza       Relevant Orders   Flu Vaccine QUAD 22moIM (Fluarix, Fluzone & Alfiuria Quad PF) (Completed)       Return in about 5 years (around 12/27/2027) for primary care follow up.    PAsencion Noble MD

## 2022-12-28 ENCOUNTER — Telehealth: Payer: Self-pay

## 2022-12-28 LAB — THYROID PANEL WITH TSH
Free Thyroxine Index: 1.6 (ref 1.2–4.9)
T3 Uptake Ratio: 21 % — ABNORMAL LOW (ref 24–39)
T4, Total: 7.5 ug/dL (ref 4.5–12.0)
TSH: 4.09 u[IU]/mL (ref 0.450–4.500)

## 2022-12-28 NOTE — Telephone Encounter (Signed)
-----   Message from Elsie Stain, MD sent at 12/28/2022  5:51 AM EST ----- Let pt know thyroid is normal  no change thyroid pill dose

## 2022-12-28 NOTE — Progress Notes (Signed)
Let pt know thyroid is normal  no change thyroid pill dose

## 2022-12-28 NOTE — Telephone Encounter (Signed)
Pt was called and is aware of results, DOB was confirmed.  Interpreter id [id number]

## 2023-02-15 ENCOUNTER — Ambulatory Visit (INDEPENDENT_AMBULATORY_CARE_PROVIDER_SITE_OTHER): Payer: Self-pay | Admitting: Advanced Practice Midwife

## 2023-02-15 VITALS — BP 133/88 | HR 56 | Wt 174.8 lb

## 2023-02-15 DIAGNOSIS — R42 Dizziness and giddiness: Secondary | ICD-10-CM

## 2023-02-15 DIAGNOSIS — N939 Abnormal uterine and vaginal bleeding, unspecified: Secondary | ICD-10-CM

## 2023-02-15 DIAGNOSIS — N946 Dysmenorrhea, unspecified: Secondary | ICD-10-CM

## 2023-02-15 LAB — CBC
Hematocrit: 38.7 % (ref 34.0–46.6)
Hemoglobin: 12.6 g/dL (ref 11.1–15.9)
MCH: 26.7 pg (ref 26.6–33.0)
MCHC: 32.6 g/dL (ref 31.5–35.7)
MCV: 82 fL (ref 79–97)
Platelets: 278 10*3/uL (ref 150–450)
RBC: 4.72 x10E6/uL (ref 3.77–5.28)
RDW: 15.4 % (ref 11.7–15.4)
WBC: 8 10*3/uL (ref 3.4–10.8)

## 2023-02-15 MED ORDER — IBUPROFEN 800 MG PO TABS: 800.0000 mg | ORAL_TABLET | Freq: Three times a day (TID) | ORAL | 3 refills | Status: DC | PRN

## 2023-02-15 MED ORDER — MEGESTROL ACETATE 40 MG PO TABS: 40.0000 mg | ORAL_TABLET | Freq: Two times a day (BID) | ORAL | 5 refills | Status: DC

## 2023-02-15 NOTE — Progress Notes (Unsigned)
GYNECOLOGY ANNUAL PREVENTATIVE CARE ENCOUNTER NOTE  History:     Melanie Rodriguez is a 37 y.o. [phone] female here for heavy menstrual periods for several years.  Tried OCPs x 2 months without improvement. Reports moderate menstrual cramps. Denies abnormal vaginal bleeding, discharge, problems with intercourse or other gynecologic concerns.  Last [place] 2014 Nml.   Gynecologic History Patient's last menstrual period was 01/21/2023 (approximate). Contraception: none Last Pap: 2022. Results were: ASCUS with negative HPV Last mammogram: None. Results were: NA  Obstetric History OB History  Gravida Para Term Preterm AB Living  SAB IAB Ectopic Multiple Live Births        0 4    # Outcome Date GA Lbr Len/2nd Weight Sex Delivery Anes PTL Lv  4 Term 05/30/17 [redacted]w[redacted]d 02:09 / 00:14 5 lb 5 oz (2.41 kg) M Vag-Spont EPI  LIV     Birth Comments: None  3 Term 2012 [redacted]w[redacted]d   M Vag-Spont  N LIV  2 Term 2008 [redacted]w[redacted]d   M Vag-Spont   LIV  1 Term 1994 [redacted]w[redacted]d   M Vag-Spont  N LIV    Past Medical History:  Diagnosis Date   Cholestasis of pregnancy 05/29/2017   Hemorrhagic ovarian cyst 10/03/2011   On CT 09/21/11    Hyperglycemia 10/2015   Pelvic abscess 07/2011   Complication of appendicitis--ultimately underwent percutaneous drainag and also had ureteral stent placed temporarily due to obstruction from abscess in right pelvis    Past Surgical History:  Procedure Laterality Date   APPENDECTOMY  08/12/2011   s/p abscess w/drain -drain d/c 12/27   CYSTOSCOPY  10/24/2011   Procedure: CYSTOSCOPY;  Surgeon: Lindaann Slough, MD;  Location: [facility];  Service: Urology;  Laterality: Right;  cysto and stent placement c arm    Current Outpatient Medications on File Prior to Visit  Medication Sig Dispense Refill   Iron, Ferrous Sulfate, 325 (65 Fe) MG TABS Take 325 mg by mouth daily. 90 tablet 1   levothyroxine (SYNTHROID) 25 MCG tablet Take 1 tablet (25 mcg total) by  mouth daily. 90 tablet 3   No current facility-administered medications on file prior to visit.    No Known Allergies  Social History:  reports that she has never smoked. She has never used smokeless tobacco. She reports that she does not drink alcohol and does not use drugs.  Family History  Problem Relation Age of Onset   Diabetes Mother    Hearing loss Mother    Cancer Father        lymphoma--not sure which type.    The following portions of the patient's history were reviewed and updated as appropriate: allergies, current medications, past family history, past medical history, past social history, past surgical history and problem list.  Review of Systems Review of Systems  Constitutional:  Negative for chills and fever.  Gastrointestinal:  Positive for abdominal pain (cramping with periods).  Genitourinary:  Negative for dyspareunia, dysuria, vaginal bleeding and vaginal discharge.  Neurological:  Positive for dizziness.    Physical Exam:  BP (!) 149/74 Comment: wont stop moving  Pulse (!) 56   Wt 174 lb 12.8 oz (79.3 kg)   LMP 01/21/2023 (Approximate)   BMI 30.96 kg/m  CONSTITUTIONAL: Well-developed, well-nourished female in no acute distress.  HENT:  Normocephalic, atraumatic. Oropharynx is clear and moist EYES: Conjunctivae normal. No scleral icterus.  SKIN: Skin is warm and dry. No rash  noted. Not diaphoretic. No erythema. No pallor. MUSCULOSKELETAL: Normal range of motion. No tenderness.  No cyanosis or edema.   NEUROLOGIC: Alert and oriented to person, place, and time. Normal muscle tone coordination.  PSYCHIATRIC: Normal mood and affect. Normal behavior. Normal judgment and thought content. CARDIOVASCULAR: Normal heart rate noted. RESPIRATORY: Effort and rate normal. BREASTS: Declined ABDOMEN: Soft, no distention, tenderness, rebound or guarding.  PELVIC: Declined  Assessment and Plan:     1. Abnormal uterine bleeding (AUB) - Instructed to apply for  financial assistance so that pelvic US can be ordered to eval for fibroids, or other structural causes of AUB. EBX may be indicated.  - CBC - megestrol (MEGACE) 40 MG tablet; Take 1 tablet (40 mg total) by mouth 2 (two) times daily. Can increase to two tablets twice a day in the event of heavy bleeding  Dispense: 90 tablet; Refill: 5 - ibuprofen (ADVIL) 800 MG tablet; Take 1 tablet (800 mg total) by mouth every 8 (eight) hours as needed.  Dispense: 30 tablet; Refill: 3  2. Dizziness  - CBC - megestrol (MEGACE) 40 MG tablet; Take 1 tablet (40 mg total) by mouth 2 (two) times daily. Can increase to two tablets twice a day in the event of heavy bleeding  Dispense: 90 tablet; Refill: 5  3. Dysmenorrhea  - ibuprofen (ADVIL) 800 MG tablet; Take 1 tablet (800 mg total) by mouth every 8 (eight) hours as needed.  Dispense: 30 tablet; Refill: 3   Pap smear due 2027 Mammogram recommended age 17 Routine preventative health maintenance measures emphasized. Please refer to After Visit Summary for other counseling recommendations.      Dorathy Kinsman, CNM [facility] [employer], [facility]

## 2023-02-21 ENCOUNTER — Telehealth: Payer: Self-pay

## 2023-02-21 NOTE — Telephone Encounter (Addendum)
-----   Message from [place], [place] sent at 02/20/2023 11:31 PM EDT ----- Please let pt know her Hgb is normal. Needs Spanish interpreter. Ask if she completed financial assistance forms. Needs pelvic US and maybe endometrial biopsy.    Notified pt results with Debarah Crape, spanish interpreter.  Pt states that she is finishing up on her financial papwerwork and will turn it.  Pt reports that she is to contact the office once she has gotten a letter of approval or denial for further management.  Pt did not have any other questions.  Leonette Nutting  02/21/23

## 2023-03-29 ENCOUNTER — Ambulatory Visit: Payer: Self-pay | Admitting: Critical Care Medicine

## 2023-05-28 NOTE — Progress Notes (Deleted)
Established Patient Office Visit  Subjective   Patient ID: Melanie Rodriguez, female    DOB: [birth year]  Age: 37 y.o. MRN: [id number]  No chief complaint on file.    12/27/22 This visit assisted with Spanish interpreter [name] [phone] Patient seen in return follow-up last seen in December by Mcclung see below 10/19/22 saw Melanie Rodriguez Melanie Rodriguez is a 37 y.o. female here today heavy and irregular periods. Previously taking OCP rx by Dr Delford Field for about 2 months.  She did not feel like they helped.  Last took them 3 years ago.    C/o heavy periods.  LMP 09/12/2022.  Lasted 7 days and very heavy.  Clots.  Only heavy about 4/7 days.  No period in December yet.  October period was normal and around 08/12/2022.  She is having occasional dizziness.  No vision changes.  No HA.  No urinary s/sx.    Antonio  with [employer] interpreting.   Data Review Lab Results Component Value Date  HGBA1C 5.3 10/06/2016   Assessment & Plan  1. Menorrhagia with irregular cycle - CBC with Differential/Platelet - TSH - Iron, TIBC and Ferritin Panel - POCT urine pregnancy  2. Dizziness Drink 80-100 ounces water daily.  - Ambulatory referral to Gynecology - CBC with Differential/Platelet - TSH - Iron, TIBC and Ferritin Panel  3. Elevated blood pressure reading Check BP 3 to 5 times weekly.  If numbers are consistently >130/85, please schedule and appt in 6 weeks  The patient then was in the ER recently with shingles she received a course of steroids and antiviral now is improved.  Patient has a follow-up gynecology visit in April and patient encouraged to keep.  Has hypothyroidism elevated TSH A1c is 6.7 patient needs a flu vaccine and agrees to receive.  Now is on low-dose Synthroid.  8/6      Review of Systems  Constitutional:  Negative for chills, diaphoresis, fever, malaise/fatigue and weight loss.  HENT:  Negative for congestion, hearing loss, nosebleeds, sore throat and  tinnitus.   Eyes:  Negative for blurred vision, photophobia and redness.  Respiratory:  Negative for cough, hemoptysis, sputum production, shortness of breath, wheezing and stridor.   Cardiovascular:  Negative for chest pain, palpitations, orthopnea, claudication, leg swelling and PND.  Gastrointestinal:  Negative for abdominal pain, blood in stool, constipation, diarrhea, heartburn, nausea and vomiting.  Genitourinary:  Negative for dysuria, flank pain, frequency, hematuria and urgency.  Musculoskeletal:  Negative for back pain, falls, joint pain, myalgias and neck pain.  Skin:  Positive for rash. Negative for itching.  Neurological:  Negative for dizziness, tingling, tremors, sensory change, speech change, focal weakness, seizures, loss of consciousness, weakness and headaches.  Endo/Heme/Allergies:  Negative for environmental allergies and polydipsia. Does not bruise/bleed easily.  Psychiatric/Behavioral:  Negative for depression, memory loss, substance abuse and suicidal ideas. The patient is not nervous/anxious and does not have insomnia.       Objective:     There were no vitals taken for this visit.   Physical Exam Vitals reviewed.  Constitutional:      Appearance: Normal appearance. She is well-developed. She is not diaphoretic.  HENT:     Head: Normocephalic and atraumatic.     Nose: No nasal deformity, septal deviation, mucosal edema or rhinorrhea.     Right Sinus: No maxillary sinus tenderness or frontal sinus tenderness.     Left Sinus: No maxillary sinus tenderness or frontal sinus tenderness.     Mouth/Throat:  Pharynx: No oropharyngeal exudate.  Eyes:     General: No scleral icterus.    Conjunctiva/sclera: Conjunctivae normal.     Pupils: Pupils are equal, round, and reactive to light.  Neck:     Thyroid: No thyromegaly.     Vascular: No carotid bruit or JVD.     Trachea: Trachea normal. No tracheal tenderness or tracheal deviation.  Cardiovascular:     Rate  and Rhythm: Normal rate and regular rhythm.     Chest Wall: PMI is not displaced.     Pulses: Normal pulses. No decreased pulses.     Heart sounds: Normal heart sounds, S1 normal and S2 normal. Heart sounds not distant. No murmur heard.    No systolic murmur is present.     No diastolic murmur is present.     No friction rub. No gallop. No S3 or S4 sounds.  Pulmonary:     Effort: No tachypnea, accessory muscle usage or respiratory distress.     Breath sounds: No stridor. No decreased breath sounds, wheezing, rhonchi or rales.  Chest:     Chest wall: No tenderness.  Abdominal:     General: Bowel sounds are normal. There is no distension.     Palpations: Abdomen is soft. Abdomen is not rigid.     Tenderness: There is no abdominal tenderness. There is no guarding or rebound.  Musculoskeletal:        General: Normal range of motion.     Cervical back: Normal range of motion and neck supple. No edema, erythema or rigidity. No muscular tenderness. Normal range of motion.  Lymphadenopathy:     Head:     Right side of head: No submental or submandibular adenopathy.     Left side of head: No submental or submandibular adenopathy.     Cervical: No cervical adenopathy.  Skin:    General: Skin is warm and dry.     Coloration: Skin is not pale.     Findings: Rash present.     Nails: There is no clubbing.  Neurological:     Mental Status: She is alert and oriented to person, place, and time.     Sensory: No sensory deficit.  Psychiatric:        Speech: Speech normal.        Behavior: Behavior normal.     No results found for any visits on 05/30/23.    The ASCVD Risk score (Arnett DK, et al., 2019) failed to calculate for the following reasons:   The 2019 ASCVD risk score is only valid for ages 76 to 52    Assessment & Plan:   Problem List Items Addressed This Visit   None    No follow-ups on file.    Shan Levans, MD

## 2023-05-30 ENCOUNTER — Ambulatory Visit: Payer: Self-pay | Admitting: Critical Care Medicine

## 2023-06-02 ENCOUNTER — Ambulatory Visit: Payer: Self-pay | Attending: Critical Care Medicine | Admitting: Critical Care Medicine

## 2023-06-02 ENCOUNTER — Encounter: Payer: Self-pay | Admitting: Critical Care Medicine

## 2023-06-02 ENCOUNTER — Other Ambulatory Visit: Payer: Self-pay

## 2023-06-02 ENCOUNTER — Other Ambulatory Visit (HOSPITAL_COMMUNITY): Payer: Self-pay

## 2023-06-02 VITALS — BP 123/83 | HR 63 | Temp 98.6°F | Ht 63.0 in | Wt 176.2 lb

## 2023-06-02 DIAGNOSIS — R7989 Other specified abnormal findings of blood chemistry: Secondary | ICD-10-CM

## 2023-06-02 DIAGNOSIS — K056 Periodontal disease, unspecified: Secondary | ICD-10-CM

## 2023-06-02 DIAGNOSIS — N939 Abnormal uterine and vaginal bleeding, unspecified: Secondary | ICD-10-CM

## 2023-06-02 DIAGNOSIS — R42 Dizziness and giddiness: Secondary | ICD-10-CM

## 2023-06-02 DIAGNOSIS — E611 Iron deficiency: Secondary | ICD-10-CM

## 2023-06-02 DIAGNOSIS — N946 Dysmenorrhea, unspecified: Secondary | ICD-10-CM

## 2023-06-02 DIAGNOSIS — N921 Excessive and frequent menstruation with irregular cycle: Secondary | ICD-10-CM

## 2023-06-02 MED ORDER — LEVOTHYROXINE SODIUM 25 MCG PO TABS
25.0000 ug | ORAL_TABLET | Freq: Every day | ORAL | 3 refills | Status: AC
Start: 2023-06-02 — End: ?
  Filled 2023-06-02: qty 30, 30d supply, fill #0
  Filled 2023-11-27: qty 30, 30d supply, fill #1

## 2023-06-02 MED ORDER — IRON (FERROUS SULFATE) 325 (65 FE) MG PO TABS
325.0000 mg | ORAL_TABLET | Freq: Every day | ORAL | 1 refills | Status: AC
Start: 2023-06-02 — End: ?
  Filled 2023-06-02: qty 30, 30d supply, fill #0
  Filled 2023-11-27: qty 30, 30d supply, fill #1

## 2023-06-02 MED ORDER — TRIAMCINOLONE ACETONIDE 0.1 % EX CREA
1.0000 | TOPICAL_CREAM | Freq: Two times a day (BID) | CUTANEOUS | 2 refills | Status: DC
  Filled 2023-06-02: qty 30, 15d supply, fill #0

## 2023-06-02 MED ORDER — MEGESTROL ACETATE 40 MG PO TABS
40.0000 mg | ORAL_TABLET | Freq: Two times a day (BID) | ORAL | 5 refills | Status: DC
Start: 2023-06-02 — End: 2024-01-15
  Filled 2023-06-02: qty 90, 30d supply, fill #0
  Filled 2023-11-27: qty 90, 30d supply, fill #1

## 2023-06-02 MED ORDER — IBUPROFEN 800 MG PO TABS
800.0000 mg | ORAL_TABLET | Freq: Three times a day (TID) | ORAL | 3 refills | Status: AC | PRN
  Filled 2023-06-02: qty 30, 10d supply, fill #0
  Filled 2023-11-27: qty 30, 10d supply, fill #1

## 2023-06-02 NOTE — Assessment & Plan Note (Signed)
Needs dental care will need orange card she cannot afford to pay out-of-pocket encouraged to get application process

## 2023-06-02 NOTE — Progress Notes (Signed)
Established Patient Office Visit  Subjective   Patient ID: Melanie Rodriguez, female    DOB: [birth year]  Age: 37 y.o. MRN: [id number]  Chief Complaint  Patient presents with   Menstrual Problem     12/27/22 This visit assisted with Spanish interpreter [name] [phone] Patient seen in return follow-up last seen in December by Mcclung see below 10/19/22 saw [name] Vernica Juday is a 37 y.o. female here today heavy and irregular periods. Previously taking OCP rx by Dr Delford Field for about 2 months.  She did not feel like they helped.  Last took them 3 years ago.    C/o heavy periods.  LMP 09/12/2022.  Lasted 7 days and very heavy.  Clots.  Only heavy about 4/7 days.  No period in December yet.  October period was normal and around 08/12/2022.  She is having occasional dizziness.  No vision changes.  No HA.  No urinary s/sx.    Antonio  with [employer] interpreting.   Data Review Lab Results Component Value Date  HGBA1C 5.3 10/06/2016   Assessment & Plan  1. Menorrhagia with irregular cycle - CBC with Differential/Platelet - TSH - Iron, TIBC and Ferritin Panel - POCT urine pregnancy  2. Dizziness Drink 80-100 ounces water daily.  - Ambulatory referral to Gynecology - CBC with Differential/Platelet - TSH - Iron, TIBC and Ferritin Panel  3. Elevated blood pressure reading Check BP 3 to 5 times weekly.  If numbers are consistently >130/85, please schedule and appt in 6 weeks  The patient then was in the ER recently with shingles she received a course of steroids and antiviral now is improved.  Patient has a follow-up gynecology visit in April and patient encouraged to keep.  Has hypothyroidism elevated TSH A1c is 6.7 patient needs a flu vaccine and agrees to receive.  Now is on low-dose Synthroid.  8/9 This patient was seen in return for follow-up visit assisted with video interpreter Johnna Acosta [phone] The patient states her menstrual periods are still a problem and she  is bleeding excessively with blood clots.  She saw women's health they wrote prescription for Megace but she cannot afford it was sent to [facility].  She is told she needs an orange card to get an ultrasound of the pelvis because of the bleeding this is yet to occur because she cannot afford the ultrasound she is uninsured.  She is yet to get the orange card financial assistance.  Another problem is she has a rash in the back of her neck.  Blood pressure on arrival is good 123/83.  She is currently on no medications because she cannot afford any of the prescriptions.  Note she is uninsured      Review of Systems  Constitutional:  Negative for chills, diaphoresis, fever, malaise/fatigue and weight loss.  HENT:  Negative for congestion, hearing loss, nosebleeds, sore throat and tinnitus.   Eyes:  Negative for blurred vision, photophobia and redness.  Respiratory:  Negative for cough, hemoptysis, sputum production, shortness of breath, wheezing and stridor.   Cardiovascular:  Negative for chest pain, palpitations, orthopnea, claudication, leg swelling and PND.  Gastrointestinal:  Negative for abdominal pain, blood in stool, constipation, diarrhea, heartburn, nausea and vomiting.  Genitourinary:  Negative for dysuria, flank pain, frequency, hematuria and urgency.       Excess menstrual bleeding  Musculoskeletal:  Negative for back pain, falls, joint pain, myalgias and neck pain.  Skin:  Positive for rash. Negative for itching.  Neurological:  Negative  for dizziness, tingling, tremors, sensory change, speech change, focal weakness, seizures, loss of consciousness, weakness and headaches.  Endo/Heme/Allergies:  Negative for environmental allergies and polydipsia. Does not bruise/bleed easily.  Psychiatric/Behavioral:  Negative for depression, memory loss, substance abuse and suicidal ideas. The patient is not nervous/anxious and does not have insomnia.       Objective:     BP 123/83   Pulse 63    Temp 98.6 F (37 C)   Ht 5\' 3"  (1.6 m)   Wt 176 lb 3.2 oz (79.9 kg)   SpO2 98%   BMI 31.21 kg/m    Physical Exam Vitals reviewed.  Constitutional:      Appearance: Normal appearance. She is well-developed. She is not diaphoretic.  HENT:     Head: Normocephalic and atraumatic.     Nose: No nasal deformity, septal deviation, mucosal edema or rhinorrhea.     Right Sinus: No maxillary sinus tenderness or frontal sinus tenderness.     Left Sinus: No maxillary sinus tenderness or frontal sinus tenderness.     Mouth/Throat:     Pharynx: No oropharyngeal exudate.  Eyes:     General: No scleral icterus.    Conjunctiva/sclera: Conjunctivae normal.     Pupils: Pupils are equal, round, and reactive to light.  Neck:     Thyroid: No thyromegaly.     Vascular: No carotid bruit or JVD.     Trachea: Trachea normal. No tracheal tenderness or tracheal deviation.  Cardiovascular:     Rate and Rhythm: Normal rate and regular rhythm.     Chest Wall: PMI is not displaced.     Pulses: Normal pulses. No decreased pulses.     Heart sounds: Normal heart sounds, S1 normal and S2 normal. Heart sounds not distant. No murmur heard.    No systolic murmur is present.     No diastolic murmur is present.     No friction rub. No gallop. No S3 or S4 sounds.  Pulmonary:     Effort: No tachypnea, accessory muscle usage or respiratory distress.     Breath sounds: No stridor. No decreased breath sounds, wheezing, rhonchi or rales.  Chest:     Chest wall: No tenderness.  Abdominal:     General: Bowel sounds are normal. There is no distension.     Palpations: Abdomen is soft. Abdomen is not rigid.     Tenderness: There is no abdominal tenderness. There is no guarding or rebound.  Musculoskeletal:        General: Normal range of motion.     Cervical back: Normal range of motion and neck supple. No edema, erythema or rigidity. No muscular tenderness. Normal range of motion.  Lymphadenopathy:     Head:     Right  side of head: No submental or submandibular adenopathy.     Left side of head: No submental or submandibular adenopathy.     Cervical: No cervical adenopathy.  Skin:    General: Skin is warm and dry.     Coloration: Skin is not pale.     Findings: Rash present.     Nails: There is no clubbing.     Comments: Back of neck  Neurological:     Mental Status: She is alert and oriented to person, place, and time.     Sensory: No sensory deficit.  Psychiatric:        Speech: Speech normal.        Behavior: Behavior normal.  No results found for any visits on 06/02/23.    The ASCVD Risk score (Arnett DK, et al., 2019) failed to calculate for the following reasons:   The 2019 ASCVD risk score is only valid for ages 69 to 34    Assessment & Plan:   Problem List Items Addressed This Visit       Digestive   Periodontal disease    Needs dental care will need orange card she cannot afford to pay out-of-pocket encouraged to get application process        Genitourinary   Abnormal uterine bleeding (AUB)    Get Megace filled      Relevant Medications   ibuprofen (ADVIL) 800 MG tablet   megestrol (MEGACE) 40 MG tablet     Other   Elevated TSH    Will refill Synthroid      Low iron    Continue iron      Relevant Medications   Iron, Ferrous Sulfate, 325 (65 Fe) MG TABS   RESOLVED: Menorrhagia with irregular cycle - Primary    Will fill Megace under patient assistance plan      Other Visit Diagnoses     Dysmenorrhea       Relevant Medications   ibuprofen (ADVIL) 800 MG tablet   Dizziness       Relevant Medications   megestrol (MEGACE) 40 MG tablet   Abnormal TSH       Relevant Medications   levothyroxine (SYNTHROID) 25 MCG tablet        Return in about 3 months (around 09/02/2023) for primary care follow up.    Shan Levans, MD

## 2023-06-02 NOTE — Assessment & Plan Note (Signed)
Continue iron  

## 2023-06-02 NOTE — Patient Instructions (Signed)
Medications [facility] to [facility] Get orange card application processed Cream for rash given Return 3 months

## 2023-06-02 NOTE — Assessment & Plan Note (Signed)
Will refill Synthroid

## 2023-06-02 NOTE — Assessment & Plan Note (Signed)
Will fill Megace under patient assistance plan

## 2023-06-02 NOTE — Assessment & Plan Note (Signed)
Get Megace filled

## 2023-09-07 ENCOUNTER — Telehealth: Payer: Self-pay | Admitting: Family Medicine

## 2023-09-07 DIAGNOSIS — N939 Abnormal uterine and vaginal bleeding, unspecified: Secondary | ICD-10-CM

## 2023-09-07 NOTE — Telephone Encounter (Signed)
Patient came into the office wanting to get her Ultrasound scheduled for AUB. Patient stated she was here for care in April and was unable to get it done then because she had to a[apply for [employer]. Patient is now approved and will like to move forward.

## 2023-09-07 NOTE — Telephone Encounter (Addendum)
Called pt w/interpreter [employer] and she did not answer. No voicemail has been set up - unable to leave message. Pt needs to be informed of ultrasound scheduled @ [facility] on Wednesday 11/20 @ 3:30 pm - arrive @ 3:00. She will need to have a full bladder.   1435  Pt called back and was given appointment information by Angelica Duran. She voiced understanding.

## 2023-09-14 ENCOUNTER — Ambulatory Visit (HOSPITAL_COMMUNITY)
Admission: RE | Admit: 2023-09-14 | Discharge: 2023-09-14 | Disposition: A | Discharge status: Home / Self Care | Payer: Self-pay | Source: Ambulatory Visit | Attending: Advanced Practice Midwife | Admitting: Advanced Practice Midwife

## 2023-09-14 DIAGNOSIS — N939 Abnormal uterine and vaginal bleeding, unspecified: Secondary | ICD-10-CM | POA: Insufficient documentation

## 2023-10-20 ENCOUNTER — Telehealth: Payer: Self-pay

## 2023-10-20 NOTE — Telephone Encounter (Addendum)
-----   Message from [place] sent at 10/15/2023  4:53 PM EST ----- I though I sent this message earlier. Please call pt with Spanish interpreter to tell her that [place] is normal and and does not show why she has been having abnormal uterine bleeding for several years. I consulted with an attending and we recommend scheduling endometrial biopsy with MD. I believe she applied for and received financial assistance.   Called pt with [employer] and informed pt provider's recommendation and that she agrees to have an endo bx.  Pt states that she has also applied for the [facility] financial assistance and is waiting as she has received the orange card.  Front office notified to call pt to schedule appt.    Leonette Nutting  10/20/23

## 2023-10-27 ENCOUNTER — Ambulatory Visit: Payer: Self-pay | Admitting: *Deleted

## 2023-10-27 NOTE — Telephone Encounter (Signed)
  Chief Complaint: Left breast lump and pain Symptoms: Can feel a lump in her breast and is having some pain. Frequency: Noticed lump yesterday but having pain for a couple of weeks. Pertinent Negatives: Patient denies fever or redness Disposition: [] ED /[] Urgent Care (no appt availability in office) / [x] Appointment(In office/virtual)/ []  [facility]/ [] Home Care/ [] Refused Recommended Disposition /[] [facility]/ []  Follow-up with PCP Additional Notes: Appt made with Haze Servant, NP 11/03/2023 at 10:30 AM.

## 2023-10-27 NOTE — Telephone Encounter (Signed)
 Message from [place] M sent at 10/27/2023  9:07 AM EST  Summary: left breast lump and has some pain   Pt stated she noticed a left breast lump and has some pain. No appointments available.  Seeking clinical advice.  Please return pt call with Spanish Interpreter.          Call History  Contact Date/Time Type Contact Phone/Fax By  10/27/2023 09:03 AM EST Phone (Incoming) Melanie Rodriguez, Melanie Rodriguez (Self) [phone] (M) McGill, Alondra   Reason for Disposition  Breast lump  Answer Assessment - Initial Assessment Questions 1. SYMPTOM: What's the main symptom you're concerned about?  (e.g., lump, pain, rash, nipple discharge)     Called pt using Spanish interpreter [phone] [employer].  I'm having pain and have a lump in left breast. 2. LOCATION: Where is the left breast located?     Lump and pain.  3. ONSET: When did pain lump  start?     I noticed the lump yesterday but I've been having pain for a couple of months.    No nipple discharge.   4. PRIOR HISTORY: Do you have any history of prior problems with your breasts? (e.g., lumps, cancer, fibrocystic breast disease)     No 5. CAUSE: What do you think is causing this symptom?     I don't know 6. OTHER SYMPTOMS: Do you have any other symptoms? (e.g., fever, breast pain, redness or rash, nipple discharge)     No nipple discharge.   3 days ago I had fever but I think it was the flu because it's gone now. 7. PREGNANCY-BREASTFEEDING: Is there any chance you are pregnant? When was your last menstrual period? Are you breastfeeding?     Not breastfeeding.   Not pregnant.  Protocols used: Breast Symptoms-A-AH

## 2023-11-03 ENCOUNTER — Ambulatory Visit: Payer: Self-pay | Attending: Nurse Practitioner | Admitting: Nurse Practitioner

## 2023-11-03 ENCOUNTER — Encounter: Payer: Self-pay | Admitting: Nurse Practitioner

## 2023-11-03 ENCOUNTER — Other Ambulatory Visit (HOSPITAL_COMMUNITY): Payer: Self-pay

## 2023-11-03 VITALS — BP 133/88 | HR 65 | Ht 63.0 in | Wt 173.6 lb

## 2023-11-03 DIAGNOSIS — L2082 Flexural eczema: Secondary | ICD-10-CM

## 2023-11-03 DIAGNOSIS — Z139 Encounter for screening, unspecified: Secondary | ICD-10-CM

## 2023-11-03 DIAGNOSIS — R7989 Other specified abnormal findings of blood chemistry: Secondary | ICD-10-CM

## 2023-11-03 DIAGNOSIS — N644 Mastodynia: Secondary | ICD-10-CM

## 2023-11-03 DIAGNOSIS — N6342 Unspecified lump in left breast, subareolar: Secondary | ICD-10-CM

## 2023-11-03 MED ORDER — TRIAMCINOLONE ACETONIDE 0.1 % EX CREA
1.0000 | TOPICAL_CREAM | Freq: Two times a day (BID) | CUTANEOUS | 2 refills | Status: AC
  Filled 2023-11-03: qty 30, 30d supply, fill #0
  Filled 2023-11-27: qty 30, 15d supply, fill #0

## 2023-11-03 NOTE — Progress Notes (Signed)
 Assessment & Plan:  Melanie Rodriguez was seen today for breast pain and skin discoloration.  Diagnoses and all orders for this visit:  Breast pain, left -     MM 3D DIAGNOSTIC MAMMOGRAM BILATERAL BREAST; Future  Subareolar mass of left breast -     MM 3D DIAGNOSTIC MAMMOGRAM BILATERAL BREAST; Future  Elevated TSH -     CMP14+EGFR -     Hemoglobin A1c  Encounter for screening involving social determinants of health (SDoH) -     AMB Referral [facility] Care Management  Flexural eczema -     triamcinolone  cream (KENALOG ) 0.1 %; Apply 1 Application topically 2 (two) times daily.    Patient has been counseled on age-appropriate routine health concerns for screening and prevention. These are reviewed and up-to-date. Referrals have been placed accordingly. Immunizations are up-to-date or declined.    Subjective:   Chief Complaint  Patient presents with   Breast Pain    Left breast lump. Onset of discomfort 10/25/22   Skin Discoloration    Inner elbow, back of neck and armpits     Melanie Rodriguez 38 y.o. female presents to office today with left breast pain and lump adjacent to and underneath the areola.   She denies any discharge from the breast.   She is uninsured. Breast clinic scholarship application was signed and faxed today.   Review of Systems  Constitutional:  Negative for fever, malaise/fatigue and weight loss.  HENT: Negative.  Negative for nosebleeds.   Eyes: Negative.  Negative for blurred vision, double vision and photophobia.  Respiratory: Negative.  Negative for cough and shortness of breath.   Cardiovascular: Negative.  Negative for chest pain, palpitations and leg swelling.  Gastrointestinal: Negative.  Negative for heartburn, nausea and vomiting.  Musculoskeletal: Negative.  Negative for myalgias.  Skin:  Positive for itching and rash.       Breast pain  Neurological: Negative.  Negative for dizziness, focal weakness, seizures and headaches.   Psychiatric/Behavioral: Negative.  Negative for suicidal ideas.     Past Medical History:  Diagnosis Date   Cholestasis of pregnancy 05/29/2017   Hemorrhagic ovarian cyst 10/03/2011   On CT 09/21/11    Hyperglycemia 10/2015   Pelvic abscess 07/2011   Complication of appendicitis--ultimately underwent percutaneous drainag and also had ureteral stent placed temporarily due to obstruction from abscess in right pelvis    Past Surgical History:  Procedure Laterality Date   APPENDECTOMY  08/12/2011   s/p abscess w/drain -drain d/c 12/27   CYSTOSCOPY  10/24/2011   Procedure: CYSTOSCOPY;  Surgeon: Thomasine Oiler, MD;  Location: [facility];  Service: Urology;  Laterality: Right;  cysto and stent placement c arm    Family History  Problem Relation Age of Onset   Diabetes Mother    Hearing loss Mother    Cancer Father        lymphoma--not sure which type.    Social History Reviewed with no changes to be made today.   Outpatient Medications Prior to Visit  Medication Sig Dispense Refill   ibuprofen  (ADVIL ) 800 MG tablet Take 1 tablet (800 mg total) by mouth every 8 (eight) hours as needed. 30 tablet 3   Iron , Ferrous Sulfate , 325 (65 Fe) MG TABS Take 325 mg by mouth daily. 90 tablet 1   levothyroxine  (SYNTHROID ) 25 MCG tablet Take 1 tablet (25 mcg total) by mouth daily. 90 tablet 3   megestrol  (MEGACE ) 40 MG tablet Take 1 tablet (40 mg total)  by mouth 2 (two) times daily. Can increase to two tablets twice a day in the event of heavy bleeding 90 tablet 5   triamcinolone  cream (KENALOG ) 0.1 % Apply 1 Application topically 2 (two) times daily. 30 g 2   No facility-administered medications prior to visit.    No Known Allergies     Objective:    BP 133/88 (BP Location: Left Arm, Patient Position: Sitting, Cuff Size: Normal)   Pulse 65   Ht 5' 3 (1.6 m)   Wt 173 lb 9.6 oz (78.7 kg)   LMP 10/20/2023 (Exact Date)   SpO2 98%   BMI 30.75 kg/m  Wt Readings from Last  3 Encounters:  11/03/23 173 lb 9.6 oz (78.7 kg)  06/02/23 176 lb 3.2 oz (79.9 kg)  02/15/23 174 lb 12.8 oz (79.3 kg)    Physical Exam Vitals and nursing note reviewed.  Constitutional:      Appearance: She is well-developed.  HENT:     Head: Normocephalic and atraumatic.  Cardiovascular:     Rate and Rhythm: Normal rate and regular rhythm.     Heart sounds: Normal heart sounds. No murmur heard.    No friction rub. No gallop.  Pulmonary:     Effort: Pulmonary effort is normal. No tachypnea or respiratory distress.     Breath sounds: Normal breath sounds. No decreased breath sounds, wheezing, rhonchi or rales.  Chest:     Chest wall: No tenderness.  Breasts:    Right: Normal.     Left: Mass present.       Comments: Healed surgical scars from previous breast augmentation are visible   Palpable lumps shown above with arrows Abdominal:     General: Bowel sounds are normal.     Palpations: Abdomen is soft.  Musculoskeletal:        General: Normal range of motion.     Cervical back: Normal range of motion.  Lymphadenopathy:     Upper Body:     Right upper body: No supraclavicular, axillary or pectoral adenopathy.     Left upper body: No supraclavicular, axillary or pectoral adenopathy.  Skin:    General: Skin is warm and dry.  Neurological:     Mental Status: She is alert and oriented to person, place, and time.     Coordination: Coordination normal.  Psychiatric:        Behavior: Behavior normal. Behavior is cooperative.        Thought Content: Thought content normal.        Judgment: Judgment normal.          Patient has been counseled extensively about nutrition and exercise as well as the importance of adherence with medications and regular follow-up. The patient was given clear instructions to go to ER or return to medical center if symptoms don't improve, worsen or new problems develop. The patient verbalized understanding.   Follow-up: Return if symptoms worsen  or fail to improve.   [name], FNP-BC [facility] [place], [place] [phone]   11/03/2023, 2:19 PM

## 2023-11-04 LAB — CMP14+EGFR
ALT: 59 [IU]/L — ABNORMAL HIGH (ref 0–32)
AST: 48 [IU]/L — ABNORMAL HIGH (ref 0–40)
Albumin: 4.4 g/dL (ref 3.9–4.9)
Alkaline Phosphatase: 106 [IU]/L (ref 44–121)
BUN/Creatinine Ratio: 22 (ref 9–23)
BUN: 11 mg/dL (ref 6–20)
Bilirubin Total: 0.5 mg/dL (ref 0.0–1.2)
CO2: 22 mmol/L (ref 20–29)
Calcium: 9.5 mg/dL (ref 8.7–10.2)
Chloride: 101 mmol/L (ref 96–106)
Creatinine, Ser: 0.5 mg/dL — ABNORMAL LOW (ref 0.57–1.00)
Globulin, Total: 3.5 g/dL (ref 1.5–4.5)
Glucose: 100 mg/dL — ABNORMAL HIGH (ref 70–99)
Potassium: 4.3 mmol/L (ref 3.5–5.2)
Sodium: 138 mmol/L (ref 134–144)
Total Protein: 7.9 g/dL (ref 6.0–8.5)
eGFR: 122 mL/min/{1.73_m2} (ref >59)

## 2023-11-04 LAB — HEMOGLOBIN A1C
Est. average glucose Bld gHb Est-mCnc: 114 mg/dL
Hgb A1c MFr Bld: 5.6 % (ref 4.8–5.6)

## 2023-11-06 ENCOUNTER — Other Ambulatory Visit: Payer: Self-pay

## 2023-11-06 ENCOUNTER — Other Ambulatory Visit: Payer: Self-pay | Admitting: Nurse Practitioner

## 2023-11-06 ENCOUNTER — Telehealth: Payer: Self-pay | Admitting: *Deleted

## 2023-11-06 DIAGNOSIS — N644 Mastodynia: Secondary | ICD-10-CM

## 2023-11-06 DIAGNOSIS — N6342 Unspecified lump in left breast, subareolar: Secondary | ICD-10-CM

## 2023-11-06 DIAGNOSIS — N6320 Unspecified lump in the left breast, unspecified quadrant: Secondary | ICD-10-CM

## 2023-11-06 NOTE — Progress Notes (Signed)
 Complex Care Management Note Care Guide Note  11/06/2023 Name: Melanie Rodriguez MRN: [id number] DOB: May 12, 1986   Complex Care Management Outreach Attempts: An unsuccessful telephone outreach was attempted today to offer the patient information about available complex care management services.  Follow Up Plan:  Additional outreach attempts will be made to offer the patient complex care management information and services.   Encounter Outcome:  No Answer  Harlene Satterfield  Care Coordination Care Guide  Direct Dial: [phone]

## 2023-11-13 NOTE — Progress Notes (Signed)
Complex Care Management Note Care Guide Note  11/13/2023 Name: Honestii Richens MRN: [id number] DOB: 1986-01-17   Complex Care Management Outreach Attempts: A third unsuccessful outreach was attempted today to offer the patient with information about available complex care management services.Using [employer] ID# [id number] named Jamison Neighbor  Follow Up Plan:  No further outreach attempts will be made at this time. We have been unable to contact the patient to offer or enroll patient in complex care management services.  Encounter Outcome:  No Answer  Gwenevere Ghazi  [facility]  [facility] Guide  Direct Dial: [phone]  Fax [phone]

## 2023-11-15 ENCOUNTER — Other Ambulatory Visit (HOSPITAL_COMMUNITY): Payer: Self-pay

## 2023-11-17 ENCOUNTER — Telehealth: Payer: Self-pay

## 2023-11-17 NOTE — Telephone Encounter (Signed)
Called patient unable to make contact or leave voicemail, information sent to the nurse pool    Interpreter id # [phone]

## 2023-11-17 NOTE — Telephone Encounter (Signed)
-----   Message from Claiborne Rigg sent at 11/14/2023  9:14 AM EST ----- Labs are essentially normal. There are some minor variations in your blood work that do not require any additional work up at this time. I do recommend you are working on a low fat low cholesterol diet. Make sure you are staying active at least 3-5 times per week.     A1c does not indicate any diabetes or prediates.

## 2023-11-27 ENCOUNTER — Other Ambulatory Visit (HOSPITAL_COMMUNITY)
Admission: RE | Admit: 2023-11-27 | Discharge: 2023-11-27 | Disposition: A | Discharge status: Home / Self Care | Payer: Self-pay | Source: Ambulatory Visit | Attending: Obstetrics and Gynecology | Admitting: Obstetrics and Gynecology

## 2023-11-27 ENCOUNTER — Encounter: Payer: Self-pay | Admitting: Obstetrics and Gynecology

## 2023-11-27 ENCOUNTER — Other Ambulatory Visit: Payer: Self-pay

## 2023-11-27 ENCOUNTER — Other Ambulatory Visit (HOSPITAL_COMMUNITY): Payer: Self-pay

## 2023-11-27 ENCOUNTER — Ambulatory Visit (INDEPENDENT_AMBULATORY_CARE_PROVIDER_SITE_OTHER): Payer: Self-pay | Admitting: Obstetrics and Gynecology

## 2023-11-27 VITALS — BP 149/84 | HR 89 | Ht 63.0 in | Wt 175.4 lb

## 2023-11-27 DIAGNOSIS — Z3202 Encounter for pregnancy test, result negative: Secondary | ICD-10-CM

## 2023-11-27 DIAGNOSIS — N393 Stress incontinence (female) (male): Secondary | ICD-10-CM

## 2023-11-27 DIAGNOSIS — N939 Abnormal uterine and vaginal bleeding, unspecified: Secondary | ICD-10-CM | POA: Insufficient documentation

## 2023-11-27 LAB — POCT PREGNANCY, URINE: Preg Test, Ur: NEGATIVE

## 2023-11-27 NOTE — Progress Notes (Signed)
Currently no bleeding Last cycle she bleed for 10 days

## 2023-11-27 NOTE — Progress Notes (Signed)
Claims to have tried iud, unsrue what type  CC: irregular menses Subjective:    Patient ID: Alex Mcmanigal, female    DOB: [birth year], 38 y.o.   MRN: [id number]  HPI 38 yo G4P4 , SVD x 4, seen for discussion of irregular bleeding.  Pt was seen by Dr. Debroah Loop in 2022 and by [facility] Dorathy Kinsman 01/2023.  Pt was prescribed megace, but I am unsure if she has been taking it consistently.  Pt stated she took the medication "if her cycles got bad."  Menses are irregular and can be heavy with clots usually around days 4-7.  Apparently patient has tried OCPs which were not effective and has used some type of IUD.  Unsure of what type of IUD the patient has used.  She did not like it because she said it made her irregular and her partner could feel the string.   Review of Systems     Objective:   Physical Exam Vitals:   11/27/23 1347  BP: (!) 149/84  Pulse: 89   PROCEDURE: US PELVIS COMPLETE WITH TRANSVAGINAL   HISTORY: Patient is a 38 y/o F with Abnormal Uterine bleeding. LMP 09/11/2023.   COMPARISON: U/S pelvis 12/21/2012.   TECHNIQUE: Two-dimensional grayscale and color doppler ultrasound of the pelvis was performed. Transvaginal was performed.   FINDINGS: The uterus measures 7.6 x 4.9 x 7.0 cm. It demonstrates a normal, homogeneous echotexture. The endometrium measures 1.0 cm and demonstrates a normal homogeneous echotexture. Multiple nabothian cysts are visualized cervix.   The right ovary measures 3.8 x 1.6 x 1.7 cm and demonstrates a normal echotexture. There is normal color Doppler flow.   The left ovary measures 3.7 x 2.4 x 2.6 cm and demonstrates a normal echotexture with a dominant follicle. There is normal color Doppler flow.   There is no fluid present within the cul-de-sac.   IMPRESSION: 1. Unremarkable ultrasound of the pelvis.        Assessment & Plan:   1. Abnormal uterine bleeding (AUB) (Primary) Endometrial biopsy performed, see separate note Pt  has not had thyroid checked for about a year so will redraw.   Also check CBC to eval for any current anemia.  Pelvic ultrasound did not show any major uterine abnormalities to explain irregular menses. F/u in 1 month to discuss treatment options. Pt  could benefit from lysteda, progesterone IUD, or uterine ablation.  Pt is somewhat fixated on hysterectomy and has not considered other options.  - Thyroid Panel With TSH - CBC - Surgical pathology( [place]/ [facility])  2. Stress incontinence, female Pt notes loss of urine with cough/laugh.  She requests evaluation through urogynecology. - Ambulatory referral to Urogynecology    Warden Fillers, MD Faculty Attending, [facility]

## 2023-11-27 NOTE — Progress Notes (Signed)
ENDOMETRIAL BIOPSY      Melanie Rodriguez is a 38 y.o. [id number] here for endometrial biopsy.  The indications for endometrial biopsy were reviewed.  Risks of the biopsy including cramping, bleeding, infection, uterine perforation, inadequate specimen and need for additional procedures were discussed. The patient states she understands and agrees to undergo procedure today. Consent was signed. Time out was performed.   Indications: DUB Urine HCG: negative  A bivalve speculum was placed into the vagina and the cervix was easily visualized and was prepped with Betadine x2. A single-toothed tenaculum was placed on the anterior lip of the cervix to stabilize it. The 3 mm pipelle was introduced into the endometrial cavity without difficulty to a depth of 7.5 cm, and a moderate amount of tissue was obtained and sent to pathology. This was repeated for a total of 3 passes. The instruments were removed from the patient's vagina. Minimal bleeding from the cervix at the tenaculum was noted.   The patient tolerated the procedure well. Routine post-procedure instructions were given to the patient.    Will base further management on results of biopsy.  Mariel Aloe, MD

## 2023-11-28 LAB — CBC
Hematocrit: 39.2 % (ref 34.0–46.6)
Hemoglobin: 13 g/dL (ref 11.1–15.9)
MCH: 28.4 pg (ref 26.6–33.0)
MCHC: 33.2 g/dL (ref 31.5–35.7)
MCV: 86 fL (ref 79–97)
Platelets: 271 10*3/uL (ref 150–450)
RBC: 4.58 x10E6/uL (ref 3.77–5.28)
RDW: 13.3 % (ref 11.7–15.4)
WBC: 8.5 10*3/uL (ref 3.4–10.8)

## 2023-11-28 LAB — THYROID PANEL WITH TSH
Free Thyroxine Index: 1.6 (ref 1.2–4.9)
T3 Uptake Ratio: 22 % — ABNORMAL LOW (ref 24–39)
T4, Total: 7.3 ug/dL (ref 4.5–12.0)
TSH: 5.58 u[IU]/mL — ABNORMAL HIGH (ref 0.450–4.500)

## 2023-11-29 ENCOUNTER — Telehealth: Payer: Self-pay

## 2023-11-29 LAB — SURGICAL PATHOLOGY

## 2023-11-29 NOTE — Telephone Encounter (Addendum)
-----   Message from Jerilynn DELENA Buddle sent at 11/28/2023 11:58 PM EST ----- TSH is elevated suggesting suboptimal treatment . Pt needs to have PCP adjust synthroid .  Hemoglobin normal, no anemia  Called pt with Spanish Interpreter Zoila R., pt notified of results and the recommendation to reach out to her PCP.  Pt verbalized understanding.    Enola Siebers,RN  11/29/23

## 2023-11-30 ENCOUNTER — Ambulatory Visit
Admission: RE | Admit: 2023-11-30 | Discharge: 2023-11-30 | Disposition: A | Discharge status: Home / Self Care | Payer: No Typology Code available for payment source | Source: Ambulatory Visit | Attending: Obstetrics and Gynecology | Admitting: Obstetrics and Gynecology

## 2023-11-30 ENCOUNTER — Ambulatory Visit
Admission: RE | Admit: 2023-11-30 | Discharge: 2023-11-30 | Disposition: A | Discharge status: Home / Self Care | Payer: Self-pay | Source: Ambulatory Visit | Attending: Obstetrics and Gynecology | Admitting: Obstetrics and Gynecology

## 2023-11-30 ENCOUNTER — Ambulatory Visit: Payer: Self-pay | Admitting: *Deleted

## 2023-11-30 ENCOUNTER — Other Ambulatory Visit: Payer: Self-pay

## 2023-11-30 VITALS — BP 126/85 | Wt 175.0 lb

## 2023-11-30 DIAGNOSIS — N6320 Unspecified lump in the left breast, unspecified quadrant: Secondary | ICD-10-CM

## 2023-11-30 DIAGNOSIS — N644 Mastodynia: Secondary | ICD-10-CM

## 2023-11-30 DIAGNOSIS — N6325 Unspecified lump in the left breast, overlapping quadrants: Secondary | ICD-10-CM

## 2023-11-30 DIAGNOSIS — Z01419 Encounter for gynecological examination (general) (routine) without abnormal findings: Secondary | ICD-10-CM

## 2023-11-30 DIAGNOSIS — Z1239 Encounter for other screening for malignant neoplasm of breast: Secondary | ICD-10-CM

## 2023-11-30 NOTE — Progress Notes (Signed)
 [name] is a 38 y.o. [phone] female who presents to [facility] clinic today with complaint of left breast lump and pain x 5 weeks.    Pap Smear: Pap smear completed today. Last Pap smear was 12/31/2020 at [facility] for [facility] and was abnormal - ASCUS with negative HPV . Per patient her most recent Pap smear is the only abnormal Pap smear she has had. Last Pap smear result is available in Epic.   Physical exam: Breasts Breasts symmetrical. Scars bilateral lower breasts due to history of breast augmentation surgery. No nipple retraction bilateral breasts. No nipple discharge bilateral breasts. No lymphadenopathy. No lumps palpated right breast. Palpated a lump within the left breast at 6 o'clock under nipple. Complaints of left lower breast pain on exam.     MS 3D DIAG MAMMO BILAT BR (aka MM) Result Date: 11/30/2023 CLINICAL DATA:  38 year old presenting with a possible palpable lump in the upper outer periareolar LEFT breast. This is the patient's initial baseline mammogram. Personal history of augmentation mammoplasty with reduction in April, 2021, with subsequent removal of the implants. EXAM: DIGITAL DIAGNOSTIC BILATERAL MAMMOGRAM WITH TOMOSYNTHESIS AND CAD; ULTRASOUND LEFT BREAST LIMITED TECHNIQUE: Bilateral digital diagnostic mammography and breast tomosynthesis was performed. The images were evaluated with computer-aided detection. ; Targeted ultrasound examination of the left breast was performed. COMPARISON:  None. ACR Breast Density Category c: The breasts are heterogeneously dense, which may obscure small masses. FINDINGS: Full field CC and MLO views of both breasts, a spot tangential view of the palpable concern in the LEFT breast, spot magnification CC and mediolateral views of LEFT breast calcifications, and a full field mediolateral view were obtained. RIGHT: No findings suspicious for malignancy. LEFT: No mammographic abnormality in the area of  palpable concern in the upper outer periareolar location. Loosely grouped round/punctate and amorphous calcifications in the outer breast at anterior to middle depth span approximately 5.5 cm. Many of the calcifications demonstrate layering on the mediolateral view, indicating benign milk of calcium. There are no suspicious linear or branching forms. Targeted ultrasound is performed in the area of palpable concern, demonstrating normal fibroglandular tissue at the 2 o'clock subareolar location. Within the skin is a circumscribed anechoic mass with scattered internal echoes measuring 0.3 x 0.2 x 0.3 cm, demonstrating slight posterior acoustic enhancement and no internal power Doppler flow. No suspicious solid mass or abnormal acoustic shadowing is identified. On correlative physical examination, there is a palpable ridge of tissue in the upper outer periareolar location corresponding what the patient is feeling, though I do not palpate a discrete mass. IMPRESSION: 1. No mammographic or sonographic evidence of malignancy involving the LEFT breast. 2. No mammographic evidence of malignancy involving the RIGHT breast. RECOMMENDATION: Screening mammogram in one year.(Code:SM-B-01Y) I have discussed the findings and recommendations with the patient. Communication with the patient was achieved with the assistance of a certified interpreter. If applicable, a reminder letter will be sent to the patient regarding the next appointment. BI-RADS CATEGORY  2: Benign. Electronically Signed   By: Melanie Rodriguez M.D.   On: 11/30/2023 11:50    Pelvic/Bimanual Ext Genitalia No lesions, no swelling and no discharge observed on external genitalia.        Vagina Vagina pink and normal texture. No lesions or discharge observed in vagina. Patient complained of AUB and showed pictures of clots. Patient is being followed by Dr. Zina at the [facility]. Encouraged patient to follow up with Dr.  Zina if  she has any questions about her test results along with tests completed and to keep follow up appointment with him next month.       Cervix Cervix is present. Cervix pink and reReferred patient to the [facility] for a diagnostic mammogram. Appointment scheduled Tuesday, November 30, 2023 at 1030.ddenned with a beefy appearance around os. No discharge observed.    Uterus Uterus is present and palpable. Uterus in normal position and normal size.        Adnexae Bilateral ovaries present and palpable. No tenderness on palpation.         Rectovaginal No rectal exam completed today since patient had no rectal complaints. No skin abnormalities observed on exam.     Smoking History: Patient has never smoked.   Patient Navigation: Patient education provided. Access to services provided for patient through [facility] program. Spanish interpreter Melanie Rodriguez from [facility] provided.    Breast and Cervical Cancer Risk Assessment: Patient has family history of two maternal aunts having breast cancer. Patient has no known genetic mutations or history of radiation treatment to the chest before age 75. Patient does not have history of cervical dysplasia, immunocompromised, or DES exposure in-utero.  Risk Scores as of Encounter on 11/30/2023     Melanie Rodriguez           5-year 0.28%   Lifetime 6.93%            Last calculated by Melanie Rodriguez, CMA on 11/30/2023 at  8:41 AM        A: [facility] exam with pap smear Complaint of left breast lump and pain x 5 weeks.  P: Referred patient to the [facility] of [facility] for a diagnostic mammogram. Appointment scheduled Tuesday, November 30, 2023 at 1030.  Melanie Wanda SQUIBB, RN 11/30/2023 8:59 AM

## 2023-11-30 NOTE — Patient Instructions (Signed)
 Explained breast self awareness with Leita Collier Montelongo.  Pap smear completed today. Let her know [facility] will cover Pap smears and HPV typing every 5 years unless has a history of abnormal Pap smears. Referred patient to the [facility] of [facility] for a diagnostic mammogram. Appointment scheduled Tuesday, November 30, 2023 at 1030. Patient aware of appointment and will be there. Leita Collier Montelongo verbalized understanding.  Evora Schechter, Wanda Ship, RN 8:59 AM

## 2023-12-07 LAB — CYTOLOGY - PAP
Comment: NEGATIVE
Diagnosis: NEGATIVE
Diagnosis: REACTIVE
High risk HPV: NEGATIVE

## 2024-01-15 ENCOUNTER — Other Ambulatory Visit (HOSPITAL_COMMUNITY)
Admission: RE | Admit: 2024-01-15 | Discharge: 2024-01-15 | Disposition: A | Discharge status: Home / Self Care | Source: Ambulatory Visit | Attending: Obstetrics and Gynecology | Admitting: Obstetrics and Gynecology

## 2024-01-15 ENCOUNTER — Encounter: Payer: Self-pay | Admitting: Obstetrics and Gynecology

## 2024-01-15 ENCOUNTER — Other Ambulatory Visit: Payer: Self-pay

## 2024-01-15 ENCOUNTER — Ambulatory Visit: Payer: Self-pay | Admitting: Obstetrics and Gynecology

## 2024-01-15 ENCOUNTER — Other Ambulatory Visit (HOSPITAL_COMMUNITY): Payer: Self-pay

## 2024-01-15 VITALS — BP 151/94 | HR 87 | Wt 175.0 lb

## 2024-01-15 DIAGNOSIS — N939 Abnormal uterine and vaginal bleeding, unspecified: Secondary | ICD-10-CM

## 2024-01-15 DIAGNOSIS — N898 Other specified noninflammatory disorders of vagina: Secondary | ICD-10-CM | POA: Insufficient documentation

## 2024-01-15 DIAGNOSIS — N760 Acute vaginitis: Secondary | ICD-10-CM | POA: Insufficient documentation

## 2024-01-15 MED ORDER — TRANEXAMIC ACID 650 MG PO TABS
1300.0000 mg | ORAL_TABLET | Freq: Three times a day (TID) | ORAL | 2 refills | Status: AC
  Filled 2024-01-15: qty 30, 5d supply, fill #0

## 2024-01-15 NOTE — Progress Notes (Signed)
 One cycle per month Menses lasts  about 12 days   Cc: Gyn follow up Subjective:    Patient ID: Chalonda Schlatter, female    DOB: 10/30/1985, 38 y.o.   MRN: [id number]  HPI Pt seen for follow up.  Endometrial biopsy was normal.  CBC was normal.  Thyroid studies slightly abnormal.   Review of Systems     Objective:   Physical Exam Vitals:   01/15/24 0943  BP: (!) 151/94  Pulse: 87         Assessment & Plan:   1. Acute vaginitis (Primary)  - Cervicovaginal ancillary only( [place])  2. Vaginal dryness  - Cervicovaginal ancillary only( [place])  3. Abnormal uterine bleeding (AUB) Pt given options for lysteda, progesterone IUD or uterine ablation. Pt desires trial of lysteda.  Follow up in 3 months.  If medication ineffective, can try ablation or proceed to hysterectomy.   - tranexamic acid (LYSTEDA) 650 MG TABS tablet; Take 2 tablets (1,300 mg total) by mouth 3 (three) times daily. Take during menses for a maximum of five days  Dispense: 30 tablet; Refill: 2    Warden Fillers, MD Faculty Attending, [facility]

## 2024-01-16 LAB — CERVICOVAGINAL ANCILLARY ONLY
Bacterial Vaginitis (gardnerella): NEGATIVE
Candida Glabrata: NEGATIVE
Candida Vaginitis: NEGATIVE
Chlamydia: NEGATIVE
Comment: NEGATIVE
Comment: NEGATIVE
Comment: NEGATIVE
Comment: NEGATIVE
Comment: NEGATIVE
Comment: NORMAL
Neisseria Gonorrhea: NEGATIVE
Trichomonas: NEGATIVE

## 2024-01-18 ENCOUNTER — Other Ambulatory Visit (HOSPITAL_COMMUNITY): Payer: Self-pay

## 2024-01-19 ENCOUNTER — Encounter: Payer: Self-pay | Admitting: Obstetrics and Gynecology

## 2024-01-23 HISTORY — PX: ABDOMINOPLASTY: SUR9

## 2024-03-04 ENCOUNTER — Ambulatory Visit: Payer: Self-pay | Admitting: Obstetrics

## 2024-03-05 ENCOUNTER — Encounter: Payer: Self-pay | Admitting: Obstetrics

## 2024-03-05 ENCOUNTER — Encounter (HOSPITAL_COMMUNITY): Payer: Self-pay

## 2024-03-05 ENCOUNTER — Ambulatory Visit (INDEPENDENT_AMBULATORY_CARE_PROVIDER_SITE_OTHER): Payer: Self-pay | Admitting: Obstetrics

## 2024-03-05 ENCOUNTER — Other Ambulatory Visit (HOSPITAL_COMMUNITY): Payer: Self-pay

## 2024-03-05 VITALS — BP 129/84 | HR 69 | Ht 62.21 in | Wt 170.2 lb

## 2024-03-05 DIAGNOSIS — N939 Abnormal uterine and vaginal bleeding, unspecified: Secondary | ICD-10-CM

## 2024-03-05 DIAGNOSIS — N3946 Mixed incontinence: Secondary | ICD-10-CM | POA: Insufficient documentation

## 2024-03-05 DIAGNOSIS — R351 Nocturia: Secondary | ICD-10-CM | POA: Insufficient documentation

## 2024-03-05 DIAGNOSIS — Z9889 Other specified postprocedural states: Secondary | ICD-10-CM | POA: Insufficient documentation

## 2024-03-05 DIAGNOSIS — R102 Pelvic and perineal pain: Secondary | ICD-10-CM

## 2024-03-05 LAB — POCT URINALYSIS DIPSTICK
Bilirubin, UA: NEGATIVE
Glucose, UA: NEGATIVE
Ketones, UA: NEGATIVE
Leukocytes, UA: NEGATIVE
Nitrite, UA: NEGATIVE
Protein, UA: NEGATIVE
Spec Grav, UA: 1.015 (ref 1.010–1.025)
Urobilinogen, UA: 0.2 U/dL
pH, UA: 5.5 (ref 5.0–8.0)

## 2024-03-05 MED ORDER — GEMTESA 75 MG PO TABS
75.0000 mg | ORAL_TABLET | Freq: Every day | ORAL | 2 refills | Status: AC
  Filled 2024-03-05: qty 30, 30d supply, fill #0

## 2024-03-05 MED ORDER — GEMTESA 75 MG PO TABS: 75.0000 mg | ORAL_TABLET | Freq: Every day | ORAL | Status: AC

## 2024-03-05 NOTE — Patient Instructions (Addendum)
 For treatment of stress urinary incontinence, which is leakage with physical activity/movement/strainging/coughing, we discussed expectant management versus nonsurgical options versus surgery. Nonsurgical options include weight loss, physical therapy, as well as a pessary.  Surgical options include a midurethral sling, which is a synthetic mesh sling that acts like a hammock under the urethra to prevent leakage of urine, a Burch urethropexy, and transurethral injection of a bulking agent.   We discussed the symptoms of overactive bladder (OAB), which include urinary urgency, urinary frequency, night-time urination, with or without urge incontinence.  We discussed management including behavioral therapy (decreasing bladder irritants by following a bladder diet, urge suppression strategies, timed voids, bladder retraining), physical therapy, medication; and for refractory cases posterior tibial nerve stimulation, sacral neuromodulation, and intravesical botulinum toxin injection.   For Beta-3 agonist medication, we discussed the potential side effect of elevated blood pressure which is more likely to occur in individuals with uncontrolled hypertension. You were given samples for Gemtesa 75 mg.  It can take a month to start working so give it time, but if you have bothersome side effects call sooner and we can try a different medication.  Call us  if you have trouble filling the prescription or if it's not covered by your insurance.  For night time frequency: - avoid fluid intake 3 hours before bedtime  The origin of pelvic floor muscle spasm can be multifactorial, including primary, reactive to a different pain source, trauma, or even part of a centralized pain syndrome.Treatment options include pelvic floor physical therapy, local (vaginal) or oral  muscle relaxants, pelvic muscle trigger point injections or centrally acting pain medications.     Please schedule follow-up with Dr. Racheal Buddle regarding your  abnormal uterine bleeding.   Please call [phone] to schedule the earliest appointment for pelvic floor PT.

## 2024-03-05 NOTE — Assessment & Plan Note (Addendum)
-   h/o laparoscopic appendectomy by Dr. Lynnell Sato in 2012 complicated by ruptured appendix, pelvic abscess requiring drain placement and R stent placement for hydronephrosis by Dr. Levi Real.  - discussed increased risk of pelvic adhesions - will avoid retropubic sling placement, encouraged to maximize symptoms relief from medical management - low threshold for cystoscopy

## 2024-03-05 NOTE — Assessment & Plan Note (Signed)
-   avoid fluid intake 3 hours before bedtime - reassess symptoms with trial of Gemtesa

## 2024-03-05 NOTE — Progress Notes (Addendum)
 New Patient Evaluation and Consultation  Referring Provider: Abigail Abler, MD PCP: Vernell Goldsmith, MD Date of Service: 03/05/2024  SUBJECTIVE Chief Complaint: New Patient (Initial Visit) Melanie Rodriguez is a 38 y.o. female here today for urinary stress incontinence. )  History of Present Illness: Melanie Rodriguez is a 38 y.o. hispanic female seen in consultation at the request of Dr Racheal Buddle for evaluation of urinary incontinence.    Spanish interpreter Melanie Rodriguez  Urinary leakage since 2012 after foley removal.  In 2012, underwent 3rd SVD mostly with activity, pregnancy complicated by ruptured appendicitis with laparoscopic appendectomy by Dr. Lynnell Sato. ED evaluation x 3 in 09/20/11, 09/21/11, and 09/28/11 with negative TVUS Postoperative pelvic abscess with pus-like urine and RLQ pain. requiring pelvic drain placement via R gluteal approach on 10/03/11 with Augmentin  removed on 10/20/11 and possible postoperative urinary retention requiring foley catheter placement for 1 month. R hydronephrosis s/p R RPG, double J stent placement by Dr. Levi Real on 10/23/20.  Endometrial biopsy due to AUB by Dr. Racheal Buddle 11/27/23 with benign disordered proliferative endometrium, using transexamic acid during cycles with minimal relief.  Recently underwent abdominoplasty 01/2024  Review of records significant for: "09/21/2011 *RADIOLOGY REPORT* Clinical Data: Right lower quadrant pelvic pain TRANSABDOMINAL AND TRANSVAGINAL ULTRASOUND OF PELVIS Technique: Both transabdominal and transvaginal ultrasound examinations of the pelvis were performed. Transabdominal technique was performed for global imaging of the pelvis including uterus, ovaries, adnexal regions, and pelvic cul-de-sac. Comparison: CT same date It was necessary to proceed with endovaginal exam following the transabdominal exam to visualize the right adnexa and endometrium. Findings: Uterus: Retroverted, retroflexed. 7.5 x 5.4 x 5.0 cm. No  focal abnormality. Endometrium: 9 mm. Uniformly thin and echogenic. Right ovary: 6.4 x 6.2 x 5.0 cm. Cyst with reticular lace-like echoes measures 3.3 x 3.0 x 2.4 cm. There may be a collapsing cyst adjacent to this within the right ovary, which could have accounted for the previous appearance on separate examination. No hydrosalpinx is visualized. Left ovary: 2.9 x 1.8 x 1.4 cm. Normal. Other findings: No free fluid IMPRESSION: Probable right ovarian hemorrhagic cyst. This accounts for the finding on the prior examination and no hydrosalpinx is identified. This may account for the patient's pain. Short-interval follow up ultrasound in 6-12 weeks is recommended, preferably during the week following the patient's normal menses. These results were called by telephone on 09/21/2011 at 7:45 p.m. to Dr. Ethelle Herb, who verbally acknowledged these results. Original Report Authenticated By: Alisia Irons, M.D. "  09/21/2011 *RADIOLOGY REPORT* Clinical Data: Right upper quadrant and right lower quadrant abdominal pain. CT ABDOMEN AND PELVIS WITH CONTRAST Technique: Multidetector CT imaging of the abdomen and pelvis was performed following the standard protocol during bolus administration of intravenous contrast. Contrast: 80mL OMNIPAQUE  IOHEXOL  300 MG/ML IV SOLN Comparison: 09/06/2011 Findings: Lung bases are clear. Abdominal viscera are normal. No lymphadenopathy or ascites. No free air. Evidence of prior appendectomy noted. Right ovarian cyst and adjacent tubular right adnexal structure noted. Together these measure 7.1 x 4.4 x 4.1 cm. Left ovary is normal. Bowel is normal. Trace pelvic free fluid noted. Minimal peri cecal stranding is again noted. No acute osseous finding. IMPRESSION: Increasing right ovarian/adnexal cystic/tubular structure. This may represent tubal ovarian abscess or abscess complicating prior appendicitis. Hemorrhagic cyst or bland hydrosalpinx is considered less likely. Original Report Authenticated  By: Alisia Irons, M.D.   "Ct Abdomen Pelvis W Contrast   09/28/2011  *RADIOLOGY REPORT*  Clinical Data: Status post appendectomy left ovary; persistent  abdominal pain and suspected abscess, not improving on antibiotics.  CT ABDOMEN AND PELVIS WITH CONTRAST  Technique:  Multidetector CT imaging of the abdomen and pelvis was performed following the standard protocol during bolus administration of intravenous contrast.  Contrast: 100mL OMNIPAQUE  IOHEXOL  300 MG/ML IV SOLN  Comparison: Prior CTs of the abdomen and pelvis, the most recent of which was performed 09/21/2011, and pelvic ultrasound performed 09/21/2011.  Findings: The visualized lung bases are clear.  The liver and spleen are unremarkable in appearance.  The gallbladder is within normal limits.  The pancreas and adrenal glands are unremarkable.  Mild bilateral fetal lobulations are noted.  The kidneys are unremarkable in appearance.  There is no evidence of hydronephrosis.  No renal or ureteral stones are seen.  No perinephric stranding is appreciated.  The small bowel is unremarkable in appearance.  The stomach is within normal limits.  No acute vascular abnormalities are seen.  There has been mild interval increase in the size of the complex loculated fluid collection occupying the expected location of the right ovary, with diffuse peripheral enhancement and soft tissue inflammation tracking about the rectum. This now measures approximately 6.1 x 5.8 x 4.4 cm, and given its appearance on CT, remains most concerning for a tubo-ovarian abscess.  Given the prior ultrasound, it is possible that a hemorrhagic cyst could have this appearance, but its features, the surrounding soft tissue inflammation and the degree of loculation makes a hemorrhagic cyst less likely.  There has been slight interval improvement in the degree of retroperitoneal soft tissue inflammation status post appendectomy, though mildly increased soft tissue inflammation is noted at the  right lower quadrant and pelvis since the prior study.  No significant free fluid is seen to suggest leak.  The colon is grossly unremarkable in appearance.  The bladder is mildly distended; mild wall thickening likely reflects the adjacent inflammatory process.  The uterus is displaced to the left, and also appears mildly inflamed, with trace surrounding fluid.  The left ovary is not well assessed but appears grossly unremarkable.  No inguinal lymphadenopathy is seen.  No acute osseous abnormalities are identified.  IMPRESSION:  1.  Mild interval increase in size of complex loculated peripherally enhancing fluid collection at the right ovary, now measuring 6.1 x 5.8 x 4.4 cm.  Given surrounding soft tissue inflammation, the degree of loculation and associated findings, this remains most suspicious for tubo-ovarian abscess.  Per the prior ultrasound, it remains possible that a hemorrhagic cyst could have this appearance, but this is considered significantly less likely. 2.  Mildly increased soft tissue inflammation noted at the right lower quadrant and pelvis; mild interval improvement in soft tissue inflammation more superiorly in the retroperitoneum. 3.  Mild wall thickening involving the bladder, likely reflecting the adjacent inflammatory process; mild inflammation with respect to the displaced uterus.  Findings were discussed with Dr. Alycia Babcock at 09:14 p.m. on 09/28/2011.  Original Report Authenticated By: Agustina Aldrich, M.D. "  TVUS 12/21/12 showed uterus measuing 7.3 x 4.7 x 6.5cm with IUD In place and normal appearing ovaries.  Urinary Symptoms: Leaks urine with cough/ sneeze, laughing, exercise, without sensation, and continuously Leaks 3 time(s) per days with activity Leaks 1x/month with urgency started 3 years ago Pad use: 4-5 liners/ mini-pads per day.   Patient is bothered by UI symptoms.  Day time voids 5.  Nocturia: 2-3 times per night to void. Drinks fluid until bedtime Denies  snoring, OSA, or leg swelling Voiding dysfunction:  does  not empty bladder well.  Patient does not use a catheter to empty bladder.  When urinating, patient feels to push on her belly or vagina to empty bladder Drinks: 64oz water  per day, 1 can soda/day, 16oz tea   UTIs: 0 UTI's in the last year.   Denies history of kidney or bladder stones, pyelonephritis, bladder cancer, and kidney cancer Denies blood in urine No results found for the last 90 days.   Pelvic Organ Prolapse Symptoms:                  Patient Denies a feeling of a bulge the vaginal area.   Bowel Symptom: Bowel movements: 3 time(s) per day, normal per pt with Type IV stool Stool consistency: soft  Straining: yes.  Splinting: no.  Incomplete evacuation: no.  Patient Denies accidental bowel leakage / fecal incontinence Bowel regimen: none Last colonoscopy: No due for age based screening [facility] Colonoscopy   This patient has no relevant Health Maintenance data.     Sexual Function Sexually active: yes.  Sexual orientation: Straight Pain with sex: Yes, deep in the pelvis in the suprapubic region when IUD was in place. Now denies pain  Pelvic Pain Denies pelvic pain  Past Medical History:  Past Medical History:  Diagnosis Date   Cholestasis of pregnancy 05/29/2017   Hemorrhagic ovarian cyst 10/03/2011   On CT 09/21/11    Hyperglycemia 10/2015   Hypothyroid    Pelvic abscess 07/2011   Complication of appendicitis--ultimately underwent percutaneous drainag and also had ureteral stent placed temporarily due to obstruction from abscess in right pelvis     Past Surgical History:   Past Surgical History:  Procedure Laterality Date   ABDOMINOPLASTY  01/2024   APPENDECTOMY  08/12/2011   s/p abscess w/drain -drain d/c 12/27   AUGMENTATION MAMMAPLASTY Bilateral    April 2021   CYSTOSCOPY  10/24/2011   Procedure: CYSTOSCOPY;  Surgeon: Jinny Mounts, MD;  Location: [facility] [place];  Service: Urology;   Laterality: Right;  cysto and stent placement c arm   REMOVAL OF BILATERAL TISSUE EXPANDERS WITH PLACEMENT OF BILATERAL BREAST IMPLANTS Bilateral    Nov. 2022     Past OB/GYN History: OB History  Gravida Para Term Preterm AB Living  4 4 4   4   SAB IAB Ectopic Multiple Live Births     0 4    # Outcome Date GA Lbr Len/2nd Weight Sex Type Anes PTL Lv  4 Term 05/30/17 [redacted]w[redacted]d 02:09 / 00:14 5 lb 5 oz (2.41 kg) M Vag-Spont EPI  LIV     Birth Comments: None  3 Term 2012 [redacted]w[redacted]d   M Vag-Spont  N LIV  2 Term 2008 [redacted]w[redacted]d   M Vag-Spont   LIV  1 Term 30 [redacted]w[redacted]d   M Vag-Spont  N LIV    Vaginal deliveries: 4, largest infant 6lb Forceps/ Vacuum deliveries: 1st pregnancy with forceps, Cesarean section: 0 Menopausal: No, LMP No LMP recorded. (Menstrual status: Irregular Periods). Contraception: condoms. Last pap smear.  Any history of abnormal pap smears: no.    Component Value Date/Time   DIAGPAP  11/30/2023 0915    - Negative for Intraepithelial Lesions or Malignancy (NILM)   DIAGPAP - Benign reactive/reparative changes 11/30/2023 0915   DIAGPAP (A) 12/31/2020 0958    - Atypical squamous cells of undetermined significance (ASC-US )   HPVHIGH Negative 11/30/2023 0915   HPVHIGH Negative 12/31/2020 0958   ADEQPAP  11/30/2023 0915    Satisfactory for evaluation; transformation  zone component PRESENT.   ADEQPAP  12/31/2020 0958    Satisfactory for evaluation; transformation zone component PRESENT.    Medications: Patient has a current medication list which includes the following prescription(s): ibuprofen , iron  (ferrous sulfate ), levothyroxine , omega 3-6-9 fatty acids, pantoprazole , tranexamic acid , triamcinolone  cream, gemtesa, and gemtesa.   Allergies: Patient has no known allergies.   Social History:  Social History   Tobacco Use   Smoking status: Never   Smokeless tobacco: Never  Vaping Use   Vaping status: Never Used  Substance Use Topics   Alcohol use: No   Drug use: No     Relationship status: single Patient lives with her children.   Patient is employed in cleaning. Regular exercise: No History of abuse: No  Family History:   Family History  Problem Relation Age of Onset   Diabetes Mother    Hearing loss Mother    Cancer Father        lymphoma--not sure which type.   Breast cancer Maternal Aunt 42   Breast cancer Maternal Aunt    Bladder Cancer Neg Hx    Uterine cancer Neg Hx    Renal cancer Neg Hx      Review of Systems: Review of Systems  Constitutional:  Positive for malaise/fatigue. Negative for fever and weight loss.       Weight gain  Respiratory:  Negative for cough, shortness of breath and wheezing.   Cardiovascular:  Positive for palpitations. Negative for chest pain and leg swelling.  Gastrointestinal:  Negative for abdominal pain and blood in stool.  Genitourinary:  Positive for dysuria. Negative for hematuria.       Abnormal uterine bleeding  Skin:  Negative for rash.  Neurological:  Negative for dizziness, weakness and headaches.  Endo/Heme/Allergies:  Does not bruise/bleed easily.  Psychiatric/Behavioral:  Negative for depression. The patient is not nervous/anxious.      OBJECTIVE Physical Exam: Vitals:   03/05/24 0816  BP: 129/84  Pulse: 69  Weight: 170 lb 3.2 oz (77.2 kg)  Height: 5' 2.21" (1.58 m)   Physical Exam Constitutional:      General: She is not in acute distress.    Appearance: Normal appearance.  Genitourinary:     Bladder and urethral meatus normal.     No lesions in the vagina.     Right Labia: No rash, tenderness, lesions, skin changes or Bartholin's cyst.    Left Labia: No tenderness, lesions, skin changes, Bartholin's cyst or rash.    Vaginal bleeding (from os due to cycle) present.     No vaginal discharge, erythema, tenderness, ulceration or granulation tissue.     Anterior, posterior and apical vaginal prolapse present.    No vaginal atrophy present.     Right Adnexa: not tender, not  full and no mass present.    Left Adnexa: not tender, not full and no mass present.    No cervical motion tenderness, discharge, friability, lesion, polyp or nabothian cyst.     Uterus is prolapsed.     Uterus is not enlarged, fixed, tender or irregular.     No uterine mass detected.    Urethral meatus caruncle not present.    No urethral prolapse, tenderness, mass, hypermobility, discharge or stress urinary incontinence with cough stress test present.     Bladder is not tender, urgency on palpation not present and masses not present.      Pelvic Floor: Levator muscle strength is 3/5.    Levator ani is tender (L >  R) and obturator internus is tender.     No asymmetrical contractions present and no pelvic spasms present.    Symmetrical pelvic sensation, anal wink present and BC reflex present. Cardiovascular:     Rate and Rhythm: Normal rate.  Pulmonary:     Effort: Pulmonary effort is normal. No respiratory distress.  Abdominal:     General: There is no distension.     Palpations: Abdomen is soft. There is no mass.     Tenderness: There is no abdominal tenderness.     Hernia: No hernia is present.    Neurological:     Mental Status: She is alert.  Vitals reviewed. Exam conducted with a chaperone present.      POP-Q:   POP-Q  -1                                            Aa   -1                                           Ba  -6                                              C   4                                            Gh  3                                            Pb  10                                            tvl   -1                                            Ap  -1                                            Bp  -8                                              D      Post-Void Residual (PVR) by Bladder Scan: In order to evaluate bladder emptying, we discussed obtaining a postvoid residual and patient agreed to this procedure.  Procedure: The  ultrasound unit was placed on the patient's abdomen in the suprapubic region after the patient had voided.  Post Void Residual - 03/05/24 0836       Post Void Residual   Post Void Residual 9 mL              Laboratory Results: Lab Results  Component Value Date   COLORU Yellow 03/05/2024   CLARITYU Clear 03/05/2024   GLUCOSEUR Negative 03/05/2024   BILIRUBINUR Negative 03/05/2024   KETONESU Negative 03/05/2024   SPECGRAV 1.015 03/05/2024   RBCUR Small 03/05/2024   PHUR 5.5 03/05/2024   PROTEINUR Negative 03/05/2024   UROBILINOGEN 0.2 03/05/2024   LEUKOCYTESUR Negative 03/05/2024    Lab Results  Component Value Date   CREATININE 0.50 (L) 11/03/2023   CREATININE 0.54 (L) 01/18/2021   CREATININE 0.55 (L) 10/27/2020    Lab Results  Component Value Date   HGBA1C 5.6 11/03/2023    Lab Results  Component Value Date   HGB 13.0 11/27/2023     ASSESSMENT AND PLAN Melanie Rodriguez Patient is a 38 y.o. with:  1. Mixed stress and urge urinary incontinence   2. Pelvic pain   3. History of pelvic surgery   4. Nocturia   5. Abnormal uterine bleeding (AUB)     Mixed stress and urge urinary incontinence Assessment & Plan: - stress > urgency - POCT UA + heme, likely due to cycle - repeat UA at follow-up and consider catheterized sample - PVR 9mL - For treatment of stress urinary incontinence,  non-surgical options include expectant management, weight loss, physical therapy, as well as a pessary.  Surgical options include a midurethral sling, Burch urethropexy, and transurethral injection of a bulking agent. - referral sent to pelvic floor PT - encouraged weight reduction and discussed association with pelvic floor disorders - discussed office procedure with urethral bulking (Bulkamid). We discussed success rate of approximately 70-80% and possible need for second injection. We reviewed that this is not a permanent procedure and the Bulkamid does dissolve over time.  Risks reviewed including injury to bladder or urethra, UTI, urinary retention and hematuria.  - Sling: The effectiveness of a midurethral vaginal mesh sling is approximately 85%, and thus, there will be times when you may leak urine after surgery, especially if your bladder is full or if you have a strong cough. There is a balance between making the sling tight enough to treat your leakage but not too tight so that you have long-term difficulty emptying your bladder. A mesh sling will not directly treat overactive bladder/urge incontinence and may worsen it.  There is an FDA safety notification on vaginal mesh procedures for prolapse but NOT mesh slings. We have extensive experience and training with mesh placement and we have close postoperative follow up to identify any potential complications from mesh. It is important to realize that this mesh is a permanent implant that cannot be easily removed. There are rare risks of mesh exposure (2-4%), pain with intercourse (0-7%), and infection (<1%). The risk of mesh exposure if more likely in a woman with risks for poor healing (prior radiation, poorly controlled diabetes, or immunocompromised). The risk of new or worsened chronic pain after mesh implant is more common in women with baseline chronic pain and/or poorly controlled anxiety or depression. Approximately 2-4% of patients will experience longer-term post-operative voiding dysfunction that may require surgical revision of the sling. We also reviewed that postoperatively, her stream may not be as strong as before surgery.  - We discussed the symptoms of overactive bladder (OAB), which include urinary urgency, urinary frequency, nocturia, with or without urge  incontinence.  While we do not know the exact etiology of OAB, several treatment options exist. We discussed management including behavioral therapy (decreasing bladder irritants, urge suppression strategies, timed voids, bladder retraining), physical  therapy, medication; for refractory cases posterior tibial nerve stimulation, sacral neuromodulation, and intravesical botulinum toxin injection.  For anticholinergic medications, we discussed the potential side effects of anticholinergics including dry eyes, dry mouth, constipation, cognitive impairment and urinary retention. For Beta-3 agonist medication, we discussed the potential side effect of elevated blood pressure which is more likely to occur in individuals with uncontrolled hypertension. - trial of Gemtesa with Rx sent - discussed fluid management and caffeine reduction  Orders: -     POCT urinalysis dipstick -     Gemtesa; Take 1 tablet (75 mg total) by mouth daily. Melanie Rodriguez; Take 1 tablet (75 mg total) by mouth daily.  Dispense: 30 tablet; Refill: 2 -     AMB referral to rehabilitation  Pelvic pain Assessment & Plan: - denies dyspareunia since IUD removal - history of ruptured appendix complicated by pelvic abscess - pelvic floor myofascial pain on exam L > R  - The origin of pelvic floor muscle spasm can be multifactorial, including primary, reactive to a different pain source, trauma, or even part of a centralized pain syndrome.Treatment options include pelvic floor physical therapy, local (vaginal) or oral  muscle relaxants, pelvic muscle trigger point injections or centrally acting pain medications.   - referral for pelvic floor PT and encouraged pelvic floor relaxation during voids and intercourse  Orders: -     AMB referral to rehabilitation  History of pelvic surgery Assessment & Plan: - h/o laparoscopic appendectomy by Dr. Lynnell Sato in 2012 complicated by ruptured appendix, pelvic abscess requiring drain placement and R stent placement for hydronephrosis by Dr. Levi Real.  - discussed increased risk of pelvic adhesions - will avoid retropubic sling placement, encouraged to maximize symptoms relief from medical management - low threshold for  cystoscopy   Nocturia Assessment & Plan: - avoid fluid intake 3 hours before bedtime - reassess symptoms with trial of Gemtesa   Orders: -     AMB referral to rehabilitation  Abnormal uterine bleeding (AUB) Assessment & Plan: - s/p benign endometrial biopsy by Dr. Racheal Buddle - denies relief with Transexmic acid - encouraged pt to follow-up with Dr. Racheal Buddle and call for appointment to review options   Time spent: I spent 61 minutes dedicated to the care of this patient on the date of this encounter to include pre-visit review of records, face-to-face time with the patient discussing mixed urinary incontinence, history of pelvic surgery, nocturia, AUB, and post visit documentation and ordering medication/ testing.   Darlene Ehlers, MD

## 2024-03-05 NOTE — Assessment & Plan Note (Addendum)
-   stress > urgency - POCT UA + heme, likely due to cycle - repeat UA at follow-up and consider catheterized sample - PVR 9mL - For treatment of stress urinary incontinence,  non-surgical options include expectant management, weight loss, physical therapy, as well as a pessary.  Surgical options include a midurethral sling, Burch urethropexy, and transurethral injection of a bulking agent. - referral sent to pelvic floor PT - encouraged weight reduction and discussed association with pelvic floor disorders - discussed office procedure with urethral bulking (Bulkamid). We discussed success rate of approximately 70-80% and possible need for second injection. We reviewed that this is not a permanent procedure and the Bulkamid does dissolve over time. Risks reviewed including injury to bladder or urethra, UTI, urinary retention and hematuria.  - Sling: The effectiveness of a midurethral vaginal mesh sling is approximately 85%, and thus, there will be times when you may leak urine after surgery, especially if your bladder is full or if you have a strong cough. There is a balance between making the sling tight enough to treat your leakage but not too tight so that you have long-term difficulty emptying your bladder. A mesh sling will not directly treat overactive bladder/urge incontinence and may worsen it.  There is an FDA safety notification on vaginal mesh procedures for prolapse but NOT mesh slings. We have extensive experience and training with mesh placement and we have close postoperative follow up to identify any potential complications from mesh. It is important to realize that this mesh is a permanent implant that cannot be easily removed. There are rare risks of mesh exposure (2-4%), pain with intercourse (0-7%), and infection (<1%). The risk of mesh exposure if more likely in a woman with risks for poor healing (prior radiation, poorly controlled diabetes, or immunocompromised). The risk of new or  worsened chronic pain after mesh implant is more common in women with baseline chronic pain and/or poorly controlled anxiety or depression. Approximately 2-4% of patients will experience longer-term post-operative voiding dysfunction that may require surgical revision of the sling. We also reviewed that postoperatively, her stream may not be as strong as before surgery.  - We discussed the symptoms of overactive bladder (OAB), which include urinary urgency, urinary frequency, nocturia, with or without urge incontinence.  While we do not know the exact etiology of OAB, several treatment options exist. We discussed management including behavioral therapy (decreasing bladder irritants, urge suppression strategies, timed voids, bladder retraining), physical therapy, medication; for refractory cases posterior tibial nerve stimulation, sacral neuromodulation, and intravesical botulinum toxin injection.  For anticholinergic medications, we discussed the potential side effects of anticholinergics including dry eyes, dry mouth, constipation, cognitive impairment and urinary retention. For Beta-3 agonist medication, we discussed the potential side effect of elevated blood pressure which is more likely to occur in individuals with uncontrolled hypertension. - trial of Gemtesa with Rx sent - discussed fluid management and caffeine reduction

## 2024-03-05 NOTE — Assessment & Plan Note (Signed)
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   - referral for pelvic floor PT and encouraged pelvic floor relaxation during voids and intercourse

## 2024-03-05 NOTE — Addendum Note (Signed)
 Addended byWyonia Hefty T on: 03/05/2024 12:47 PM   Modules accepted: Level of Service

## 2024-03-05 NOTE — Assessment & Plan Note (Signed)
-   s/p benign endometrial biopsy by Dr. Racheal Buddle - denies relief with Transexmic acid - encouraged pt to follow-up with Dr. Racheal Buddle and call for appointment to review options

## 2024-03-14 ENCOUNTER — Other Ambulatory Visit (HOSPITAL_COMMUNITY): Payer: Self-pay

## 2024-06-13 ENCOUNTER — Ambulatory Visit: Admitting: Obstetrics

## 2024-06-27 ENCOUNTER — Encounter: Payer: Self-pay | Admitting: Physical Therapy

## 2024-07-03 NOTE — Therapy (Signed)
 OUTPATIENT PHYSICAL THERAPY FEMALE PELVIC EVALUATION   Patient Name: Melanie Rodriguez MRN: [id number] DOB:Jun 19, 1986, 38 y.o., female Today's Date: 07/05/2024  END OF SESSION:  PT End of Session - 07/04/24 1610     Visit Number 1    Date for PT Re-Evaluation 01/01/25    Authorization Type [employer]    PT Start Time 1600    PT Stop Time 1645    PT Time Calculation (min) 45 min    Activity Tolerance Patient tolerated treatment well    Behavior During Therapy [facility] for tasks assessed/performed          Past Medical History:  Diagnosis Date   Cholestasis of pregnancy 05/29/2017   Hemorrhagic ovarian cyst 10/03/2011   On CT 09/21/11    Hyperglycemia 10/2015   Hypothyroid    Pelvic abscess 07/2011   Complication of appendicitis--ultimately underwent percutaneous drainag and also had ureteral stent placed temporarily due to obstruction from abscess in right pelvis   Past Surgical History:  Procedure Laterality Date   ABDOMINOPLASTY  01/2024   APPENDECTOMY  08/12/2011   s/p abscess w/drain -drain d/c 12/27   AUGMENTATION MAMMAPLASTY Bilateral    April 2021   CYSTOSCOPY  10/24/2011   Procedure: CYSTOSCOPY;  Surgeon: Thomasine Oiler, MD;  Location: [facility];  Service: Urology;  Laterality: Right;  cysto and stent placement c arm   REMOVAL OF BILATERAL TISSUE EXPANDERS WITH PLACEMENT OF BILATERAL BREAST IMPLANTS Bilateral    Nov. 2022   Patient Active Problem List   Diagnosis Date Noted   Mixed stress and urge urinary incontinence 03/05/2024   History of pelvic surgery 03/05/2024   Nocturia 03/05/2024   Abnormal uterine bleeding (AUB) 06/02/2023   Low iron  06/02/2023   Shingles 12/27/2022   Elevated TSH 12/27/2022   Adjustment reaction with anxiety and depression 11/10/2021   S/P breast augmentation 05/03/2021   Periodontal disease 05/03/2021   Pelvic pain 10/27/2020   Gastroesophageal reflux disease with esophagitis and hemorrhage 10/27/2020    Allergic rhinitis 10/27/2020    PCP: Brien Belvie DRAFTS , MD  REFERRING PROVIDER: Guadlupe Lianne DASEN, MD   REFERRING DIAG:  [phone] (ICD-10-CM) - Mixed stress and urge urinary incontinence  R10.2 (ICD-10-CM) - Pelvic pain  R35.1 (ICD-10-CM) - Nocturia    THERAPY DIAG:  Scar  Other low back pain  Pelvic pain  Cramp and spasm  Rationale for Evaluation and Treatment: Rehabilitation  ONSET DATE: 2012  SUBJECTIVE:  SUBJECTIVE STATEMENT: 37yo with mixed urinary incontinence, pelvic floor myofascial pain. Patient reports pain since she had the IUD taken out.  Fluid intake: Drinks: 64oz water  per day, 1 can soda/day, 16oz tea   PAIN:  Are you having pain? Yes NPRS scale: 5/10 Pain location: pelvic area  Pain type: cramping and twisting Pain description: constant   Aggravating factors: penile penetration; comes on randomly Relieving factors: tylenol   PRECAUTIONS: None  RED FLAGS: None   WEIGHT BEARING RESTRICTIONS: No  FALLS:  Has patient fallen in last 6 months? No  OCCUPATION: construction  ACTIVITY LEVEL : moderate activity due to job  PLOF: Independent  PATIENT GOALS: reduce urinary leakage  PERTINENT HISTORY:  Abdominoplasty; Appendectomy; Pelvic abscess Sexual abuse: No  BOWEL MOVEMENT:NO issues  URINATION: Pain with urination: Yes Fully empty bladder: No When urinating, patient feels to push on her belly or vagina to empty bladder  Stream: Weak Urgency: Yes  Frequency: Day time voids 5.  Nocturia: 2-3 times per night to void.  Leakage: Coughing, Sneezing, Exercise, and without sensation, continuous leakage Pads: Yes: panty liner 3-4 per day  INTERCOURSE:  Ability to have vaginal penetration Yes  Pain with intercourse: Initial Penetration, During Penetration, Deep  Penetration, and Pain Interrupts Intercourse DrynessYes , uses lubricant Climax: yes Marinoff Scale: 3/3  PREGNANCY: Vaginal deliveries 4  PROLAPSE: Pressure   OBJECTIVE:  Note: Objective measures were completed at Evaluation unless otherwise noted.  DIAGNOSTIC FINDINGS:  none   COGNITION: Overall cognitive status: Within functional limits for tasks assessed     SENSATION: Light touch: Appears intact   LUMBARAROM/PROM: lumbar ROM is full   LOWER EXTREMITY ROM:  Passive ROM Right eval Left eval  Hip external rotation 40 40   (Blank rows = not tested)  LOWER EXTREMITY FFU:[place]zmjo hip strength 4/5.   PALPATION:   General: Tenderness located in the lumbar, decreased movement os L3-L5  Pelvic Alignment: ASIS are equal  Abdominal: scar on abdomen are restricted; tightness in the abdomen from Abdominoplasty                 External Perineal Exam: tenderness located on the ischiocavernosus, bulbocavernosus, perineal body, levator ani                             Internal Pelvic Floor: tenderness located in levator ani and obturator internist, tightness along the posterior vaginal canal  Patient confirms identification and approves PT to assess internal pelvic floor and treatment Yes No emotional/communication barriers or cognitive limitation. Patient is motivated to learn. Patient understands and agrees with treatment goals and plan. PT explains patient will be examined in standing, sitting, and lying down to see how their muscles and joints work. When they are ready, they will be asked to remove their underwear so PT can examine their perineum. The patient is also given the option of providing their own chaperone as one is not provided in our facility. The patient also has the right and is explained the right to defer or refuse any part of the evaluation or treatment including the internal exam. With the patient's consent, PT will use one gloved finger to gently assess  the muscles of the pelvic floor, seeing how well it contracts and relaxes and if there is muscle symmetry. After, the patient will get dressed and PT and patient will discuss exam findings and plan of care. PT and patient discuss plan of care, schedule, attendance policy and  HEP activities.   PELVIC MMT:   MMT eval  Vaginal 2/5  (Blank rows = not tested)        TONE: Average tone fo pelvic floor  PROLAPSE: Posteriro wall weakness at entrance, anterior wall weakness at entrance, slight uterine downward movement  TODAY'S TREATMENT:                                                                                                                              DATE: 07/04/24  EVAL Examination completed, findings reviewed, pt educated on POC, HEP, and female pelvic floor anatomy, reasoning with pelvic floor assessment internally with pt consent, and abdominal massage. Pt motivated to participate in PT and agreeable to attempt recommendations.     PATIENT EDUCATION:  07/04/24 Education details: Access Code: [email or web address] Person educated: Patient Education method: Explanation, Demonstration, Tactile cues, Verbal cues, and Handouts Education comprehension: verbalized understanding, returned demonstration, verbal cues required, tactile cues required, and needs further education  HOME EXERCISE PROGRAM: 07/04/24 Access Code: [email or web address] URL: [email or web address] Date: 07/04/2024 Prepared by: Channing Pereyra  Exercises - Supine Diaphragmatic Breathing  - 1 x daily - 7 x weekly - 1 sets - 10 reps - Seated Diaphragmatic Breathing  - 1 x daily - 7 x weekly - 1 sets - 10 reps - Seated Piriformis Stretch with Trunk Bend  - 1 x daily - 7 x weekly - 1 sets - 2 reps - 30 sec hold - Cat Cow  - 1 x daily - 7 x weekly - 1 sets - 10 reps - Diaphragmatic Breathing in Child's Pose with Pelvic Floor Relaxation  - 1 x daily - 7 x weekly - 1 sets - 2 reps - 30 sec hold  ASSESSMENT:  CLINICAL  IMPRESSION: Patient is a 38  y.o. female who was seen today for physical therapy evaluation and treatment for pelvic pain, mixed incontinence, and nocturia. Patient reports pelvic pain at level 5/10 with penile penetration and randomly. Patient feels she has to push on her belly to fully empty her bladder. She has a weak urine stream with pain. She has urgency and has to rush to the bathroom. She will leak urine with coughing, sneezing, exercise, and without sensation, and continuous leakage. She wears 3-4 panty liners. She wakes up 2-3 times per night to urinate. Patient has tightness around her abdomen and scars from her abdominoplasty. She has decreased lumbar extension and tenderness in the lumbar area. She has tenderness throughout the pelvic floor muscles and strength is 2/5. She presents with posterior and anterior wall weakness at the introitus. Patient will benefit from skilled therapy to improve pelvic floor coordination and strength while reducing her pain.   OBJECTIVE IMPAIRMENTS: decreased activity tolerance, decreased coordination, decreased ROM, decreased strength, increased fascial restrictions, increased muscle spasms, and pain.   ACTIVITY LIMITATIONS: carrying, lifting, bending, sitting, standing, squatting, continence, toileting, and locomotion level  PARTICIPATION LIMITATIONS: meal prep, cleaning, laundry, interpersonal relationship, shopping, community activity,  and occupation  PERSONAL FACTORS: 1-2 comorbidities: Abdominoplasty; Appendectomy; Pelvic abscess are also affecting patient's functional outcome.   REHAB POTENTIAL: Good  CLINICAL DECISION MAKING: Evolving/moderate complexity  EVALUATION COMPLEXITY: Moderate   GOALS: Goals reviewed with patient? Yes  SHORT TERM GOALS: Target date: 08/01/24  Patient independent with initial HEP for scar massage to improve tissue mobility.  Baseline: Goal status: INITIAL  2.  Patient is able to perform diaphragmatic breathing to  lengthen the pelvic floor to reduce pain.  Baseline:  Goal status: INITIAL  3.  Patient educated on urgency to reduce the urge to void.  Baseline:  Goal status: INITIAL   LONG TERM GOALS: Target date: 01/02/24  Patient is independent with advanced HEP for core and pelvic floor strength.  Baseline:  Goal status: INITIAL  2.  Patient is able  demonstrate pressure management with lifting, carrying, and other work tasks to reduce pressure on the pelvic floor.  Baseline:  Goal status: INITIAL  3.  Patient is able to have the urge to urinate and use her urge suppression skills and walk to the bathroom without leaking.  Baseline:  Goal status: INITIAL  4.  Patient is able to use her diaphragmatic breathing to relax her pelvic floor and push the urine out without pushing on her abdomen.  Baseline:  Goal status: INITIAL  5.  Patient reports her pain level is </= 2/10 with her daily tasks due to improved abdominal tissue mobility, increased pelvic floor strength and reduction of trigger points. Baseline:  Goal status: INITIAL   PLAN:  PT FREQUENCY: 1-2x/week  PT DURATION: other: 5 months  PLANNED INTERVENTIONS: 97110-Therapeutic exercises, 97530- Therapeutic activity, 97112- Neuromuscular re-education, 97535- Self Care, [id number]- Manual therapy, [id number]- Electrical stimulation (unattended), 20560 (1-2 muscles), 20561 (3+ muscles)- Dry Needling, Patient/Family education, Joint mobilization, Spinal mobilization, Scar mobilization, Cryotherapy, Moist heat, and Biofeedback  PLAN FOR NEXT SESSION: manual work to abdomen and rib cage to work on diaphragmatic breathing, hip and back stretches, mobilization to lumbar; education of prolapse   Channing Pereyra, PT 07/05/24 8:58 AM

## 2024-07-04 ENCOUNTER — Encounter: Payer: Self-pay | Attending: Obstetrics | Admitting: Physical Therapy

## 2024-07-04 ENCOUNTER — Other Ambulatory Visit: Payer: Self-pay

## 2024-07-04 ENCOUNTER — Encounter: Payer: Self-pay | Admitting: Physical Therapy

## 2024-07-04 DIAGNOSIS — R252 Cramp and spasm: Secondary | ICD-10-CM | POA: Insufficient documentation

## 2024-07-04 DIAGNOSIS — L905 Scar conditions and fibrosis of skin: Secondary | ICD-10-CM | POA: Insufficient documentation

## 2024-07-04 DIAGNOSIS — R102 Pelvic and perineal pain: Secondary | ICD-10-CM | POA: Insufficient documentation

## 2024-07-04 DIAGNOSIS — M5459 Other low back pain: Secondary | ICD-10-CM | POA: Insufficient documentation

## 2024-07-09 ENCOUNTER — Encounter: Payer: Self-pay | Admitting: Physical Therapy

## 2024-07-09 DIAGNOSIS — R102 Pelvic and perineal pain: Secondary | ICD-10-CM

## 2024-07-09 DIAGNOSIS — R252 Cramp and spasm: Secondary | ICD-10-CM

## 2024-07-09 DIAGNOSIS — L905 Scar conditions and fibrosis of skin: Secondary | ICD-10-CM

## 2024-07-09 DIAGNOSIS — M5459 Other low back pain: Secondary | ICD-10-CM

## 2024-07-09 NOTE — Therapy (Signed)
 OUTPATIENT PHYSICAL THERAPY FEMALE PELVIC TREATMENT   Patient Name: Melanie Rodriguez MRN: [id number] DOB:[birth year], 38 y.o., female Today's Date: 07/09/2024  END OF SESSION:  PT End of Session - 07/09/24 0833     Visit Number 2    Date for PT Re-Evaluation 01/01/25    Authorization Type [employer]    PT Start Time 0830    PT Stop Time 0915    PT Time Calculation (min) 45 min    Activity Tolerance Patient tolerated treatment well    Behavior During Therapy [facility] for tasks assessed/performed          Past Medical History:  Diagnosis Date   Cholestasis of pregnancy 05/29/2017   Hemorrhagic ovarian cyst 10/03/2011   On CT 09/21/11    Hyperglycemia 10/2015   Hypothyroid    Pelvic abscess 07/2011   Complication of appendicitis--ultimately underwent percutaneous drainag and also had ureteral stent placed temporarily due to obstruction from abscess in right pelvis   Past Surgical History:  Procedure Laterality Date   ABDOMINOPLASTY  01/2024   APPENDECTOMY  08/12/2011   s/p abscess w/drain -drain d/c 12/27   AUGMENTATION MAMMAPLASTY Bilateral    April 2021   CYSTOSCOPY  10/24/2011   Procedure: CYSTOSCOPY;  Surgeon: Thomasine Oiler, MD;  Location: [facility];  Service: Urology;  Laterality: Right;  cysto and stent placement c arm   REMOVAL OF BILATERAL TISSUE EXPANDERS WITH PLACEMENT OF BILATERAL BREAST IMPLANTS Bilateral    Nov. 2022   Patient Active Problem List   Diagnosis Date Noted   Mixed stress and urge urinary incontinence 03/05/2024   History of pelvic surgery 03/05/2024   Nocturia 03/05/2024   Abnormal uterine bleeding (AUB) 06/02/2023   Low iron  06/02/2023   Shingles 12/27/2022   Elevated TSH 12/27/2022   Adjustment reaction with anxiety and depression 11/10/2021   S/P breast augmentation 05/03/2021   Periodontal disease 05/03/2021   Pelvic pain 10/27/2020   Gastroesophageal reflux disease with esophagitis and hemorrhage 10/27/2020    Allergic rhinitis 10/27/2020    PCP: Brien Belvie DRAFTS , MD  REFERRING PROVIDER: Guadlupe Lianne DASEN, MD   REFERRING DIAG:  [id number] (ICD-10-CM) - Mixed stress and urge urinary incontinence  R10.2 (ICD-10-CM) - Pelvic pain  R35.1 (ICD-10-CM) - Nocturia    THERAPY DIAG:  Scar  Other low back pain  Pelvic pain  Cramp and spasm  Rationale for Evaluation and Treatment: Rehabilitation  ONSET DATE: 2012  SUBJECTIVE:  SUBJECTIVE STATEMENT:(interpreter present) REduced leakage since last visit. Leakage happens more with sneeze, laugh, jump.  Fluid intake: Drinks: 64oz water  per day, 1 can soda/day, 16oz tea   PAIN:  Are you having pain? Yes NPRS scale: 5/10 07/09/24: pain level 5/10 Pain location: pelvic area  Pain type: cramping and twisting Pain description: constant   Aggravating factors: penile penetration; comes on randomly Relieving factors: tylenol   PRECAUTIONS: None  RED FLAGS: None   WEIGHT BEARING RESTRICTIONS: No  FALLS:  Has patient fallen in last 6 months? No  OCCUPATION: construction  ACTIVITY LEVEL : moderate activity due to job  PLOF: Independent  PATIENT GOALS: reduce urinary leakage  PERTINENT HISTORY:  Abdominoplasty; Appendectomy; Pelvic abscess Sexual abuse: No  BOWEL MOVEMENT:NO issues  URINATION: Pain with urination: Yes Fully empty bladder: No When urinating, patient feels to push on her belly or vagina to empty bladder  Stream: Weak Urgency: Yes  Frequency: Day time voids 5.  Nocturia: 2-3 times per night to void.  Leakage: Coughing, Sneezing, Exercise, and without sensation, continuous leakage Pads: Yes: panty liner 3-4 per day  INTERCOURSE:  Ability to have vaginal penetration Yes  Pain with intercourse: Initial Penetration, During Penetration,  Deep Penetration, and Pain Interrupts Intercourse DrynessYes , uses lubricant Climax: yes Marinoff Scale: 3/3  PREGNANCY: Vaginal deliveries 4  PROLAPSE: Pressure   OBJECTIVE:  Note: Objective measures were completed at Evaluation unless otherwise noted.  DIAGNOSTIC FINDINGS:  none   COGNITION: Overall cognitive status: Within functional limits for tasks assessed     SENSATION: Light touch: Appears intact   LUMBARAROM/PROM: lumbar ROM is full   LOWER EXTREMITY ROM:  Passive ROM Right eval Left eval  Hip external rotation 40 40   (Blank rows = not tested)  LOWER EXTREMITY FFU:[place] hip strength 4/5.   PALPATION:   General: Tenderness located in the lumbar, decreased movement os L3-L5  Pelvic Alignment: ASIS are equal  Abdominal: scar on abdomen are restricted; tightness in the abdomen from Abdominoplasty                 External Perineal Exam: tenderness located on the ischiocavernosus, bulbocavernosus, perineal body, levator ani                             Internal Pelvic Floor: tenderness located in levator ani and obturator internist, tightness along the posterior vaginal canal  Patient confirms identification and approves PT to assess internal pelvic floor and treatment Yes No emotional/communication barriers or cognitive limitation. Patient is motivated to learn. Patient understands and agrees with treatment goals and plan. PT explains patient will be examined in standing, sitting, and lying down to see how their muscles and joints work. When they are ready, they will be asked to remove their underwear so PT can examine their perineum. The patient is also given the option of providing their own chaperone as one is not provided in our facility. The patient also has the right and is explained the right to defer or refuse any part of the evaluation or treatment including the internal exam. With the patient's consent, PT will use one gloved finger to gently  assess the muscles of the pelvic floor, seeing how well it contracts and relaxes and if there is muscle symmetry. After, the patient will get dressed and PT and patient will discuss exam findings and plan of care. PT and patient discuss plan of care, schedule, attendance policy and HEP activities.  PELVIC MMT:   MMT eval  Vaginal 2/5  (Blank rows = not tested)        TONE: Average tone fo pelvic floor  PROLAPSE: Posteriro wall weakness at entrance, anterior wall weakness at entrance, slight uterine downward movement  TODAY'S TREATMENT:                                                                                                                              DATE:  07/09/24 Manual: Soft tissue mobilization: Manual work to the diaphragm to improve mobility Scar tissue mobilization: Scar work to the lower abdominal scar, around the umbilicus, just below the breast line to improve tissue mobiltiy Myofascial release: Tissue rolling of the abdominal scar and around the abdomen and lower rib cage.  Release around the areas of the ureters to prove tissue mobility Neuromuscular re-education: Form correction: Educated patient on scar massage to improve the tissue mobility for lumbar ROM and increased diaphragmatic breathing Down training: Diaphragmatic breathing to elongate the diaphragm and pelvic floor with tactile cues to open up the rib cage and breath into her abdomen Exercises: Stretches/mobility: Prone press up to elongate the anterior trunk and increase lumbar extension Thread the needle to improve trunk rotation Supine piriformis stretch bil. Holding 30 sec     07/04/24  EVAL Examination completed, findings reviewed, pt educated on POC, HEP, and female pelvic floor anatomy, reasoning with pelvic floor assessment internally with pt consent, and abdominal massage. Pt motivated to participate in PT and agreeable to attempt recommendations.     PATIENT EDUCATION:   07/09/24 Education details: Access Code: [email or web address], educated patient on how to mobilize her scars Person educated: Patient Education method: Explanation, Demonstration, Tactile cues, Verbal cues, and Handouts Education comprehension: verbalized understanding, returned demonstration, verbal cues required, tactile cues required, and needs further education  HOME EXERCISE PROGRAM: 07/09/24 Access Code: [email or web address] URL: [email or web address] Date: 07/09/2024 Prepared by: Channing Pereyra  Exercises - Supine Diaphragmatic Breathing  - 1 x daily - 7 x weekly - 1 sets - 10 reps - Seated Diaphragmatic Breathing  - 1 x daily - 7 x weekly - 1 sets - 10 reps - Seated Piriformis Stretch with Trunk Bend  - 1 x daily - 7 x weekly - 1 sets - 2 reps - 30 sec hold - Cat Cow  - 1 x daily - 7 x weekly - 1 sets - 10 reps - Diaphragmatic Breathing in Child's Pose with Pelvic Floor Relaxation  - 1 x daily - 7 x weekly - 1 sets - 2 reps - 30 sec hold - Child's Pose with Thread the Needle  - 1 x daily - 7 x weekly - 1 sets - 2 reps - 15 sec hold - Prone Press Up  - 1 x daily - 7 x weekly - 1 sets - 10 reps  ASSESSMENT:  CLINICAL IMPRESSION: Patient is a 38  y.o. female who was seen today for physical therapy treatment  for pelvic pain, mixed incontinence, and nocturia. Patient is able to perform diaphragmatic breathing feeling her pelvic floor relax. She understands how to perform manual work to her scars. She is learning how to restore her spinal mobility. She reports less urinary leakage since last visit. . Patient will benefit from skilled therapy to improve pelvic floor coordination and strength while reducing her pain.   OBJECTIVE IMPAIRMENTS: decreased activity tolerance, decreased coordination, decreased ROM, decreased strength, increased fascial restrictions, increased muscle spasms, and pain.   ACTIVITY LIMITATIONS: carrying, lifting, bending, sitting, standing, squatting, continence, toileting,  and locomotion level  PARTICIPATION LIMITATIONS: meal prep, cleaning, laundry, interpersonal relationship, shopping, community activity, and occupation  PERSONAL FACTORS: 1-2 comorbidities: Abdominoplasty; Appendectomy; Pelvic abscess are also affecting patient's functional outcome.   REHAB POTENTIAL: Good  CLINICAL DECISION MAKING: Evolving/moderate complexity  EVALUATION COMPLEXITY: Moderate   GOALS: Goals reviewed with patient? Yes  SHORT TERM GOALS: Target date: 08/01/24  Patient independent with initial HEP for scar massage to improve tissue mobility.  Baseline: Goal status: INITIAL  2.  Patient is able to perform diaphragmatic breathing to lengthen the pelvic floor to reduce pain.  Baseline:  Goal status: INITIAL  3.  Patient educated on urgency to reduce the urge to void.  Baseline:  Goal status: INITIAL   LONG TERM GOALS: Target date: 01/02/24  Patient is independent with advanced HEP for core and pelvic floor strength.  Baseline:  Goal status: INITIAL  2.  Patient is able  demonstrate pressure management with lifting, carrying, and other work tasks to reduce pressure on the pelvic floor.  Baseline:  Goal status: INITIAL  3.  Patient is able to have the urge to urinate and use her urge suppression skills and walk to the bathroom without leaking.  Baseline:  Goal status: INITIAL  4.  Patient is able to use her diaphragmatic breathing to relax her pelvic floor and push the urine out without pushing on her abdomen.  Baseline:  Goal status: INITIAL  5.  Patient reports her pain level is </= 2/10 with her daily tasks due to improved abdominal tissue mobility, increased pelvic floor strength and reduction of trigger points. Baseline:  Goal status: INITIAL   PLAN:  PT FREQUENCY: 1-2x/week  PT DURATION: other: 5 months  PLANNED INTERVENTIONS: 97110-Therapeutic exercises, 97530- Therapeutic activity, 97112- Neuromuscular re-education, 97535- Self Care, [id number]-  Manual therapy, G0283- Electrical stimulation (unattended), 20560 (1-2 muscles), 20561 (3+ muscles)- Dry Needling, Patient/Family education, Joint mobilization, Spinal mobilization, Scar mobilization, Cryotherapy, Moist heat, and Biofeedback  PLAN FOR NEXT SESSION: mobilization to lumbar; education of prolapse, manual work to the pelvic floor, urge to void   Channing Pereyra, PT 07/09/24 9:24 AM

## 2024-07-11 ENCOUNTER — Encounter: Admitting: Physical Therapy

## 2024-07-18 ENCOUNTER — Encounter: Admitting: Physical Therapy

## 2024-07-18 ENCOUNTER — Encounter: Payer: Self-pay | Admitting: Physical Therapy

## 2024-07-18 DIAGNOSIS — L905 Scar conditions and fibrosis of skin: Secondary | ICD-10-CM

## 2024-07-18 DIAGNOSIS — R102 Pelvic and perineal pain: Secondary | ICD-10-CM

## 2024-07-18 DIAGNOSIS — R252 Cramp and spasm: Secondary | ICD-10-CM

## 2024-07-18 DIAGNOSIS — M5459 Other low back pain: Secondary | ICD-10-CM

## 2024-07-18 NOTE — Therapy (Signed)
 OUTPATIENT PHYSICAL THERAPY FEMALE PELVIC TREATMENT   Patient Name: Melanie Rodriguez MRN: [id number] DOB:[birth year], 38 y.o., female Today's Date: 07/18/2024  END OF SESSION:  PT End of Session - 07/18/24 1613     Visit Number 3    Date for Recertification  01/01/25    Authorization Type [employer]    Authorization Time Period 11/30/23-11/29/24    PT Start Time 1410   came late   PT Stop Time 1450    PT Time Calculation (min) 40 min    Activity Tolerance Patient tolerated treatment well    Behavior During Therapy [facility] for tasks assessed/performed          Past Medical History:  Diagnosis Date   Cholestasis of pregnancy 05/29/2017   Hemorrhagic ovarian cyst 10/03/2011   On CT 09/21/11    Hyperglycemia 10/2015   Hypothyroid    Pelvic abscess 07/2011   Complication of appendicitis--ultimately underwent percutaneous drainag and also had ureteral stent placed temporarily due to obstruction from abscess in right pelvis   Past Surgical History:  Procedure Laterality Date   ABDOMINOPLASTY  01/2024   APPENDECTOMY  08/12/2011   s/p abscess w/drain -drain d/c 12/27   AUGMENTATION MAMMAPLASTY Bilateral    April 2021   CYSTOSCOPY  10/24/2011   Procedure: CYSTOSCOPY;  Surgeon: Thomasine Oiler, MD;  Location: [facility];  Service: Urology;  Laterality: Right;  cysto and stent placement c arm   REMOVAL OF BILATERAL TISSUE EXPANDERS WITH PLACEMENT OF BILATERAL BREAST IMPLANTS Bilateral    Nov. 2022   Patient Active Problem List   Diagnosis Date Noted   Mixed stress and urge urinary incontinence 03/05/2024   History of pelvic surgery 03/05/2024   Nocturia 03/05/2024   Abnormal uterine bleeding (AUB) 06/02/2023   Low iron  06/02/2023   Shingles 12/27/2022   Elevated TSH 12/27/2022   Adjustment reaction with anxiety and depression 11/10/2021   S/P breast augmentation 05/03/2021   Periodontal disease 05/03/2021   Pelvic pain 10/27/2020   Gastroesophageal reflux  disease with esophagitis and hemorrhage 10/27/2020   Allergic rhinitis 10/27/2020    PCP: Brien Belvie DRAFTS , MD  REFERRING PROVIDER: Guadlupe Lianne DASEN, MD   REFERRING DIAG:  [phone] (ICD-10-CM) - Mixed stress and urge urinary incontinence  R10.2 (ICD-10-CM) - Pelvic pain  R35.1 (ICD-10-CM) - Nocturia    THERAPY DIAG:  Scar  Other low back pain  Pelvic pain  Cramp and spasm  Rationale for Evaluation and Treatment: Rehabilitation  ONSET DATE: 2012  SUBJECTIVE:  SUBJECTIVE STATEMENT:(interpreter present) She would leak with a cough and sneeze.   PAIN:  Are you having pain? Yes NPRS scale: 5/10 07/09/24: pain level 5/10 07/18/24; Pain level 4-5/10 Pain location: pelvic area  Pain type: cramping and twisting Pain description: constant   Aggravating factors: penile penetration; comes on randomly Relieving factors: tylenol   PRECAUTIONS: None  RED FLAGS: None   WEIGHT BEARING RESTRICTIONS: No  FALLS:  Has patient fallen in last 6 months? No  OCCUPATION: construction  ACTIVITY LEVEL : moderate activity due to job  PLOF: Independent  PATIENT GOALS: reduce urinary leakage  PERTINENT HISTORY:  Abdominoplasty; Appendectomy; Pelvic abscess Sexual abuse: No  BOWEL MOVEMENT:NO issues  URINATION: Pain with urination: Yes Fully empty bladder: No When urinating, patient feels to push on her belly or vagina to empty bladder  Stream: Weak Urgency: Yes  Frequency: Day time voids 5.  Nocturia: 2-3 times per night to void.  Leakage: Coughing, Sneezing, Exercise, and without sensation, continuous leakage Pads: Yes: panty liner 3-4 per day  INTERCOURSE:  Ability to have vaginal penetration Yes  Pain with intercourse: Initial Penetration, During Penetration, Deep Penetration, and Pain  Interrupts Intercourse DrynessYes , uses lubricant Climax: yes Marinoff Scale: 3/3  PREGNANCY: Vaginal deliveries 4  PROLAPSE: Pressure   OBJECTIVE:  Note: Objective measures were completed at Evaluation unless otherwise noted.  DIAGNOSTIC FINDINGS:  none   COGNITION: Overall cognitive status: Within functional limits for tasks assessed     SENSATION: Light touch: Appears intact   LUMBARAROM/PROM: lumbar ROM is full   LOWER EXTREMITY ROM:  Passive ROM Right eval Left eval  Hip external rotation 40 40   (Blank rows = not tested)  LOWER EXTREMITY FFU:[place] hip strength 4/5.   PALPATION:   General: Tenderness located in the lumbar, decreased movement os L3-L5  Pelvic Alignment: ASIS are equal  Abdominal: scar on abdomen are restricted; tightness in the abdomen from Abdominoplasty                 External Perineal Exam: tenderness located on the ischiocavernosus, bulbocavernosus, perineal body, levator ani                             Internal Pelvic Floor: tenderness located in levator ani and obturator internist, tightness along the posterior vaginal canal  Patient confirms identification and approves PT to assess internal pelvic floor and treatment Yes No emotional/communication barriers or cognitive limitation. Patient is motivated to learn. Patient understands and agrees with treatment goals and plan. PT explains patient will be examined in standing, sitting, and lying down to see how their muscles and joints work. When they are ready, they will be asked to remove their underwear so PT can examine their perineum. The patient is also given the option of providing their own chaperone as one is not provided in our facility. The patient also has the right and is explained the right to defer or refuse any part of the evaluation or treatment including the internal exam. With the patient's consent, PT will use one gloved finger to gently assess the muscles of the  pelvic floor, seeing how well it contracts and relaxes and if there is muscle symmetry. After, the patient will get dressed and PT and patient will discuss exam findings and plan of care. PT and patient discuss plan of care, schedule, attendance policy and HEP activities.   PELVIC MMT:   MMT eval 07/18/24  Vaginal 2/5 3/5  after manual work  (Blank rows = not tested)        TONE: Average tone fo pelvic floor  PROLAPSE: Posteriro wall weakness at entrance, anterior wall weakness at entrance, slight uterine downward movement  TODAY'S TREATMENT:   07/18/24 Manual: Soft tissue mobilization: Manual work to the left hip adductor to elongate the muscle Manual work to the ischiocavernosus to reduce trigger points Internal pelvic floor techniques: No emotional/communication barriers or cognitive limitation. Patient is motivated to learn. Patient understands and agrees with treatment goals and plan. PT explains patient will be examined in standing, sitting, and lying down to see how their muscles and joints work. When they are ready, they will be asked to remove their underwear so PT can examine their perineum. The patient is also given the option of providing their own chaperone as one is not provided in our facility. The patient also has the right and is explained the right to defer or refuse any part of the evaluation or treatment including the internal exam. With the patient's consent, PT will use one gloved finger to gently assess the muscles of the pelvic floor, seeing how well it contracts and relaxes and if there is muscle symmetry. After, the patient will get dressed and PT and patient will discuss exam findings and plan of care. PT and patient discuss plan of care, schedule, attendance policy and HEP activities.  Therapist gloved finger in the vaginal canal working on the right urethra sphincter, levator ani, obturator internist, right side of bladder, mobilization of the cervix.                                                                                                                                 DATE:  07/09/24 Manual: Soft tissue mobilization: Manual work to the diaphragm to improve mobility Scar tissue mobilization: Scar work to the lower abdominal scar, around the umbilicus, just below the breast line to improve tissue mobiltiy Myofascial release: Tissue rolling of the abdominal scar and around the abdomen and lower rib cage.  Release around the areas of the ureters to prove tissue mobility Neuromuscular re-education: Form correction: Educated patient on scar massage to improve the tissue mobility for lumbar ROM and increased diaphragmatic breathing Down training: Diaphragmatic breathing to elongate the diaphragm and pelvic floor with tactile cues to open up the rib cage and breath into her abdomen Exercises: Stretches/mobility: Prone press up to elongate the anterior trunk and increase lumbar extension Thread the needle to improve trunk rotation Supine piriformis stretch bil. Holding 30 sec     07/04/24  EVAL Examination completed, findings reviewed, pt educated on POC, HEP, and female pelvic floor anatomy, reasoning with pelvic floor assessment internally with pt consent, and abdominal massage. Pt motivated to participate in PT and agreeable to attempt recommendations.     PATIENT EDUCATION:  07/09/24 Education details: Access Code: [email or web address], educated patient on how to mobilize her scars Person educated: Patient  Education method: Explanation, Demonstration, Tactile cues, Verbal cues, and Handouts Education comprehension: verbalized understanding, returned demonstration, verbal cues required, tactile cues required, and needs further education  HOME EXERCISE PROGRAM: 07/09/24 Access Code: [email or web address] URL: [email or web address] Date: 07/09/2024 Prepared by: Channing Pereyra  Exercises - Supine Diaphragmatic Breathing  - 1 x daily - 7 x weekly - 1 sets  - 10 reps - Seated Diaphragmatic Breathing  - 1 x daily - 7 x weekly - 1 sets - 10 reps - Seated Piriformis Stretch with Trunk Bend  - 1 x daily - 7 x weekly - 1 sets - 2 reps - 30 sec hold - Cat Cow  - 1 x daily - 7 x weekly - 1 sets - 10 reps - Diaphragmatic Breathing in Child's Pose with Pelvic Floor Relaxation  - 1 x daily - 7 x weekly - 1 sets - 2 reps - 30 sec hold - Child's Pose with Thread the Needle  - 1 x daily - 7 x weekly - 1 sets - 2 reps - 15 sec hold - Prone Press Up  - 1 x daily - 7 x weekly - 1 sets - 10 reps  ASSESSMENT:  CLINICAL IMPRESSION: Patient is a 38  y.o. female who was seen today for physical therapy treatment for pelvic pain, mixed incontinence, and nocturia. Patient does not have to push on her belly to urinate but does not feel she has fully emptied her bladder.  No pain after manual work. She had restrictions around the cervix and bladder. She had multiple trigger points throughout the right side of the pelvic floor. Patient will benefit from skilled therapy to improve pelvic floor coordination and strength while reducing her pain.   OBJECTIVE IMPAIRMENTS: decreased activity tolerance, decreased coordination, decreased ROM, decreased strength, increased fascial restrictions, increased muscle spasms, and pain.   ACTIVITY LIMITATIONS: carrying, lifting, bending, sitting, standing, squatting, continence, toileting, and locomotion level  PARTICIPATION LIMITATIONS: meal prep, cleaning, laundry, interpersonal relationship, shopping, community activity, and occupation  PERSONAL FACTORS: 1-2 comorbidities: Abdominoplasty; Appendectomy; Pelvic abscess are also affecting patient's functional outcome.   REHAB POTENTIAL: Good  CLINICAL DECISION MAKING: Evolving/moderate complexity  EVALUATION COMPLEXITY: Moderate   GOALS: Goals reviewed with patient? Yes  SHORT TERM GOALS: Target date: 08/01/24  Patient independent with initial HEP for scar massage to improve  tissue mobility.  Baseline: Goal status: Met 07/18/24  2.  Patient is able to perform diaphragmatic breathing to lengthen the pelvic floor to reduce pain.  Baseline:  Goal status: INITIAL  3.  Patient educated on urgency to reduce the urge to void.  Baseline:  Goal status: INITIAL   LONG TERM GOALS: Target date: 01/02/24  Patient is independent with advanced HEP for core and pelvic floor strength.  Baseline:  Goal status: INITIAL  2.  Patient is able  demonstrate pressure management with lifting, carrying, and other work tasks to reduce pressure on the pelvic floor.  Baseline:  Goal status: INITIAL  3.  Patient is able to have the urge to urinate and use her urge suppression skills and walk to the bathroom without leaking.  Baseline:  Goal status: INITIAL  4.  Patient is able to use her diaphragmatic breathing to relax her pelvic floor and push the urine out without pushing on her abdomen.  Baseline:  Goal status: INITIAL  5.  Patient reports her pain level is </= 2/10 with her daily tasks due to improved abdominal tissue mobility, increased pelvic floor strength and reduction of trigger  points. Baseline:  Goal status: INITIAL   PLAN:  PT FREQUENCY: 1-2x/week  PT DURATION: other: 5 months  PLANNED INTERVENTIONS: 97110-Therapeutic exercises, 97530- Therapeutic activity, 97112- Neuromuscular re-education, 97535- Self Care, [id number]- Manual therapy, [id number]- Electrical stimulation (unattended), 20560 (1-2 muscles), 20561 (3+ muscles)- Dry Needling, Patient/Family education, Joint mobilization, Spinal mobilization, Scar mobilization, Cryotherapy, Moist heat, and Biofeedback  PLAN FOR NEXT SESSION:  education of prolapse, manual work to the pelvic floor, urge to void   Channing Pereyra, PT 07/18/24 4:59 PM

## 2024-07-25 ENCOUNTER — Encounter: Payer: Self-pay | Admitting: Physical Therapy

## 2024-07-25 ENCOUNTER — Encounter: Attending: Physical Therapy | Admitting: Physical Therapy

## 2024-07-25 DIAGNOSIS — M5459 Other low back pain: Secondary | ICD-10-CM | POA: Insufficient documentation

## 2024-07-25 DIAGNOSIS — R102 Pelvic and perineal pain unspecified side: Secondary | ICD-10-CM | POA: Insufficient documentation

## 2024-07-25 DIAGNOSIS — R252 Cramp and spasm: Secondary | ICD-10-CM | POA: Insufficient documentation

## 2024-07-25 DIAGNOSIS — L905 Scar conditions and fibrosis of skin: Secondary | ICD-10-CM | POA: Insufficient documentation

## 2024-07-25 NOTE — Therapy (Addendum)
 " OUTPATIENT PHYSICAL THERAPY FEMALE PELVIC TREATMENT   Patient Name: Melanie Rodriguez MRN: [id number] DOB:March 30, 1986, 38 y.o., female Today's Date: 07/25/2024  END OF SESSION:  PT End of Session - 07/25/24 1507     Visit Number 4    Date for Recertification  01/01/25    Authorization Type [employer]    Authorization Time Period 11/30/23-11/29/24    PT Start Time 1500    PT Stop Time 1545    PT Time Calculation (min) 45 min    Activity Tolerance Patient tolerated treatment well    Behavior During Therapy [facility] for tasks assessed/performed          Past Medical History:  Diagnosis Date   Cholestasis of pregnancy 05/29/2017   Hemorrhagic ovarian cyst 10/03/2011   On CT 09/21/11    Hyperglycemia 10/2015   Hypothyroid    Pelvic abscess 07/2011   Complication of appendicitis--ultimately underwent percutaneous drainag and also had ureteral stent placed temporarily due to obstruction from abscess in right pelvis   Past Surgical History:  Procedure Laterality Date   ABDOMINOPLASTY  01/2024   APPENDECTOMY  08/12/2011   s/p abscess w/drain -drain d/c 12/27   AUGMENTATION MAMMAPLASTY Bilateral    April 2021   CYSTOSCOPY  10/24/2011   Procedure: CYSTOSCOPY;  Surgeon: Thomasine Oiler, MD;  Location: [facility];  Service: Urology;  Laterality: Right;  cysto and stent placement c arm   REMOVAL OF BILATERAL TISSUE EXPANDERS WITH PLACEMENT OF BILATERAL BREAST IMPLANTS Bilateral    Nov. 2022   Patient Active Problem List   Diagnosis Date Noted   Mixed stress and urge urinary incontinence 03/05/2024   History of pelvic surgery 03/05/2024   Nocturia 03/05/2024   Abnormal uterine bleeding (AUB) 06/02/2023   Low iron  06/02/2023   Shingles 12/27/2022   Elevated TSH 12/27/2022   Adjustment reaction with anxiety and depression 11/10/2021   S/P breast augmentation 05/03/2021   Periodontal disease 05/03/2021   Pelvic pain 10/27/2020   Gastroesophageal reflux disease with  esophagitis and hemorrhage 10/27/2020   Allergic rhinitis 10/27/2020    PCP: Brien Belvie DRAFTS , MD  REFERRING PROVIDER: Guadlupe Lianne DASEN, MD   REFERRING DIAG:  [phone] (ICD-10-CM) - Mixed stress and urge urinary incontinence  R10.2 (ICD-10-CM) - Pelvic pain  R35.1 (ICD-10-CM) - Nocturia    THERAPY DIAG:  Scar  Other low back pain  Pelvic pain  Cramp and spasm  Rationale for Evaluation and Treatment: Rehabilitation  ONSET DATE: 2012  SUBJECTIVE:  SUBJECTIVE STATEMENT:(interpreter present) After the last visit she felt much better. She is sleeping better and walking. No intercourse yet.    PAIN:  Are you having pain? Yes NPRS scale: 5/10 07/09/24: pain level 5/10 07/18/24; Pain level 4-5/10 Pain location: pelvic area  Pain type: cramping and twisting Pain description: constant   Aggravating factors: penile penetration; comes on randomly Relieving factors: tylenol   PRECAUTIONS: None  RED FLAGS: None   WEIGHT BEARING RESTRICTIONS: No  FALLS:  Has patient fallen in last 6 months? No  OCCUPATION: construction  ACTIVITY LEVEL : moderate activity due to job  PLOF: Independent  PATIENT GOALS: reduce urinary leakage  PERTINENT HISTORY:  Abdominoplasty; Appendectomy; Pelvic abscess Sexual abuse: No  BOWEL MOVEMENT:NO issues  URINATION: Pain with urination: Yes Fully empty bladder: No When urinating, patient feels to push on her belly or vagina to empty bladder  07/25/24: not pressing on abdomen to urinate Stream: Weak Urgency: Yes  Frequency: Day time voids 5.  Nocturia: 2-3 times per night to void.  Leakage: Coughing, Sneezing, Exercise, and without sensation, continuous leakage Pads: Yes: panty liner 3-4 per day  INTERCOURSE:  Ability to have vaginal penetration Yes   Pain with intercourse: Initial Penetration, During Penetration, Deep Penetration, and Pain Interrupts Intercourse DrynessYes , uses lubricant Climax: yes Marinoff Scale: 3/3  PREGNANCY: Vaginal deliveries 4  PROLAPSE: Pressure   OBJECTIVE:  Note: Objective measures were completed at Evaluation unless otherwise noted.  DIAGNOSTIC FINDINGS:  none   COGNITION: Overall cognitive status: Within functional limits for tasks assessed     SENSATION: Light touch: Appears intact   LUMBARAROM/PROM: lumbar ROM is full   LOWER EXTREMITY ROM:  Passive ROM Right eval Left eval  Hip external rotation 40 40   (Blank rows = not tested)  LOWER EXTREMITY FFU:[place] hip strength 4/5.   PALPATION:   General: Tenderness located in the lumbar, decreased movement os L3-L5  Pelvic Alignment: ASIS are equal  Abdominal: scar on abdomen are restricted; tightness in the abdomen from Abdominoplasty                 External Perineal Exam: tenderness located on the ischiocavernosus, bulbocavernosus, perineal body, levator ani                             Internal Pelvic Floor: tenderness located in levator ani and obturator internist, tightness along the posterior vaginal canal  Patient confirms identification and approves PT to assess internal pelvic floor and treatment Yes No emotional/communication barriers or cognitive limitation. Patient is motivated to learn. Patient understands and agrees with treatment goals and plan. PT explains patient will be examined in standing, sitting, and lying down to see how their muscles and joints work. When they are ready, they will be asked to remove their underwear so PT can examine their perineum. The patient is also given the option of providing their own chaperone as one is not provided in our facility. The patient also has the right and is explained the right to defer or refuse any part of the evaluation or treatment including the internal exam. With  the patient's consent, PT will use one gloved finger to gently assess the muscles of the pelvic floor, seeing how well it contracts and relaxes and if there is muscle symmetry. After, the patient will get dressed and PT and patient will discuss exam findings and plan of care. PT and patient discuss plan of care, schedule, attendance  policy and HEP activities.   PELVIC MMT:   MMT eval 07/18/24  Vaginal 2/5 3/5 after manual work  (Blank rows = not tested)        TONE: Average tone fo pelvic floor  PROLAPSE: Posteriro wall weakness at entrance, anterior wall weakness at entrance, slight uterine downward movement  TODAY'S TREATMENT:   07/25/24 Manual: Soft tissue mobilization: Manual work to the diaphragm to improve mobility for diaphragmatic breathing Manual work along the trigger points in the rectus abdominus Scar tissue mobilization: Manual work to the scar going through the restrictions in all different directions  Using a suction cup on the scar to improve the mobility and reduce the restrictions Myofascial release: Tissue rolling along the lower rib cage Fascial release along the suprapubic area and sides of lower abdomen Neuromuscular re-education: Down training: Diaphragmatic breathing with opening the lower rib cage and feeling it in the pelvic floor Childs pose with diaphragmatic breathing Thread the needle with diaphragmatic breathing Pulling the leg over the body for trunk rotation Prone press up to open up the abdomen and increase lumbar extension  07/18/24 Manual: Soft tissue mobilization: Manual work to the left hip adductor to elongate the muscle Manual work to the ischiocavernosus to reduce trigger points Internal pelvic floor techniques: No emotional/communication barriers or cognitive limitation. Patient is motivated to learn. Patient understands and agrees with treatment goals and plan. PT explains patient will be examined in standing, sitting, and lying down to  see how their muscles and joints work. When they are ready, they will be asked to remove their underwear so PT can examine their perineum. The patient is also given the option of providing their own chaperone as one is not provided in our facility. The patient also has the right and is explained the right to defer or refuse any part of the evaluation or treatment including the internal exam. With the patient's consent, PT will use one gloved finger to gently assess the muscles of the pelvic floor, seeing how well it contracts and relaxes and if there is muscle symmetry. After, the patient will get dressed and PT and patient will discuss exam findings and plan of care. PT and patient discuss plan of care, schedule, attendance policy and HEP activities.  Therapist gloved finger in the vaginal canal working on the right urethra sphincter, levator ani, obturator internist, right side of bladder, mobilization of the cervix.                                                                                                                                DATE:  07/09/24 Manual: Soft tissue mobilization: Manual work to the diaphragm to improve mobility Scar tissue mobilization: Scar work to the lower abdominal scar, around the umbilicus, just below the breast line to improve tissue mobiltiy Myofascial release: Tissue rolling of the abdominal scar and around the abdomen and lower rib cage.  Release around the areas of the  ureters to prove tissue mobility Neuromuscular re-education: Form correction: Educated patient on scar massage to improve the tissue mobility for lumbar ROM and increased diaphragmatic breathing Down training: Diaphragmatic breathing to elongate the diaphragm and pelvic floor with tactile cues to open up the rib cage and breath into her abdomen Exercises: Stretches/mobility: Prone press up to elongate the anterior trunk and increase lumbar extension Thread the needle to improve trunk  rotation Supine piriformis stretch bil. Holding 30 sec     07/04/24  EVAL Examination completed, findings reviewed, pt educated on POC, HEP, and female pelvic floor anatomy, reasoning with pelvic floor assessment internally with pt consent, and abdominal massage. Pt motivated to participate in PT and agreeable to attempt recommendations.     PATIENT EDUCATION:  07/09/24 Education details: Access Code: [email or web address], educated patient on how to mobilize her scars Person educated: Patient Education method: Explanation, Demonstration, Tactile cues, Verbal cues, and Handouts Education comprehension: verbalized understanding, returned demonstration, verbal cues required, tactile cues required, and needs further education  HOME EXERCISE PROGRAM: 07/09/24 Access Code: [email or web address] URL: [email or web address] Date: 07/09/2024 Prepared by: Channing Pereyra  Exercises - Supine Diaphragmatic Breathing  - 1 x daily - 7 x weekly - 1 sets - 10 reps - Seated Diaphragmatic Breathing  - 1 x daily - 7 x weekly - 1 sets - 10 reps - Seated Piriformis Stretch with Trunk Bend  - 1 x daily - 7 x weekly - 1 sets - 2 reps - 30 sec hold - Cat Cow  - 1 x daily - 7 x weekly - 1 sets - 10 reps - Diaphragmatic Breathing in Child's Pose with Pelvic Floor Relaxation  - 1 x daily - 7 x weekly - 1 sets - 2 reps - 30 sec hold - Child's Pose with Thread the Needle  - 1 x daily - 7 x weekly - 1 sets - 2 reps - 15 sec hold - Prone Press Up  - 1 x daily - 7 x weekly - 1 sets - 10 reps  ASSESSMENT:  CLINICAL IMPRESSION: Patient is a 38  y.o. female who was seen today for physical therapy treatment for pelvic pain, mixed incontinence, and nocturia. Pelvic pain is decreased to 3/10. Patient is waking up 1 time at night. Instead of 3 times. I have not sneezed or coughed hard so no leakage yet.  Patient able to perform diaphragmatic breathing and feel the pelvic flor relax. Patient will benefit from skilled therapy to improve  pelvic floor coordination and strength while reducing her pain.   OBJECTIVE IMPAIRMENTS: decreased activity tolerance, decreased coordination, decreased ROM, decreased strength, increased fascial restrictions, increased muscle spasms, and pain.   ACTIVITY LIMITATIONS: carrying, lifting, bending, sitting, standing, squatting, continence, toileting, and locomotion level  PARTICIPATION LIMITATIONS: meal prep, cleaning, laundry, interpersonal relationship, shopping, community activity, and occupation  PERSONAL FACTORS: 1-2 comorbidities: Abdominoplasty; Appendectomy; Pelvic abscess are also affecting patient's functional outcome.   REHAB POTENTIAL: Good  CLINICAL DECISION MAKING: Evolving/moderate complexity  EVALUATION COMPLEXITY: Moderate   GOALS: Goals reviewed with patient? Yes  SHORT TERM GOALS: Target date: 08/01/24  Patient independent with initial HEP for scar massage to improve tissue mobility.  Baseline: Goal status: Met 07/18/24  2.  Patient is able to perform diaphragmatic breathing to lengthen the pelvic floor to reduce pain.  Baseline:  Goal status: Met 07/25/24  3.  Patient educated on urgency to reduce the urge to void.  Baseline:  Goal status: INITIAL   LONG TERM GOALS: Target date:  01/02/24  Patient is independent with advanced HEP for core and pelvic floor strength.  Baseline:  Goal status: INITIAL  2.  Patient is able  demonstrate pressure management with lifting, carrying, and other work tasks to reduce pressure on the pelvic floor.  Baseline:  Goal status: INITIAL  3.  Patient is able to have the urge to urinate and use her urge suppression skills and walk to the bathroom without leaking.  Baseline:  Goal status: INITIAL  4.  Patient is able to use her diaphragmatic breathing to relax her pelvic floor and push the urine out without pushing on her abdomen.  Baseline:  Goal status: INITIAL  5.  Patient reports her pain level is </= 2/10 with her daily  tasks due to improved abdominal tissue mobility, increased pelvic floor strength and reduction of trigger points. Baseline:  Goal status: INITIAL   PLAN:  PT FREQUENCY: 1-2x/week  PT DURATION: other: 5 months  PLANNED INTERVENTIONS: 97110-Therapeutic exercises, 97530- Therapeutic activity, 97112- Neuromuscular re-education, 97535- Self Care, [id number]- Manual therapy, [id number]- Electrical stimulation (unattended), 20560 (1-2 muscles), 20561 (3+ muscles)- Dry Needling, Patient/Family education, Joint mobilization, Spinal mobilization, Scar mobilization, Cryotherapy, Moist heat, and Biofeedback  PLAN FOR NEXT SESSION:  education of prolapse,  urge to void, core strength   Channing Pereyra, PT 07/25/24 3:47 PM  PHYSICAL THERAPY DISCHARGE SUMMARY  Visits from Start of Care: 4  Current functional level related to goals / functional outcomes: Patient called to have the remaining appointments cancelled and does not want to be scheduled further at this time.    Remaining deficits: See above.    Education / Equipment: HEP   Patient agrees to discharge. Patient goals were not met. Patient is being discharged due to the patient's request. Thank you for the referral.   Channing Pereyra, PT 11/06/2024 3:22 PM    "

## 2024-08-01 ENCOUNTER — Encounter: Admitting: Physical Therapy

## 2024-08-13 ENCOUNTER — Encounter: Admitting: Physical Therapy

## 2024-08-13 ENCOUNTER — Telehealth: Payer: Self-pay | Admitting: Physical Therapy

## 2024-08-13 NOTE — Telephone Encounter (Signed)
 Spoke to patient on the phone with an interpreter. She reported she cancelled the appointment earlier. She was not feeling well. New appointment made for December 11.  Channing Pereyra, PT @10 /21/25@ 1:28 PM

## 2024-10-03 ENCOUNTER — Encounter: Payer: Self-pay | Attending: Physical Therapy | Admitting: Physical Therapy

## 2024-10-03 ENCOUNTER — Encounter: Payer: Self-pay | Admitting: Physical Therapy

## 2024-10-03 DIAGNOSIS — R102 Pelvic and perineal pain unspecified side: Secondary | ICD-10-CM

## 2024-10-03 DIAGNOSIS — L905 Scar conditions and fibrosis of skin: Secondary | ICD-10-CM

## 2024-10-03 DIAGNOSIS — R252 Cramp and spasm: Secondary | ICD-10-CM | POA: Insufficient documentation

## 2024-10-03 DIAGNOSIS — M5459 Other low back pain: Secondary | ICD-10-CM | POA: Insufficient documentation

## 2024-10-03 NOTE — Therapy (Signed)
 OUTPATIENT PHYSICAL THERAPY FEMALE PELVIC TREATMENT   Patient Name: Melanie Rodriguez MRN: [id number] DOB:1986/05/03, 38 y.o., female Today's Date: 10/03/2024  END OF SESSION:  PT End of Session - 10/03/24 1043     Visit Number 5    Date for Recertification  01/01/25    Authorization Type [employer]    Authorization Time Period 11/30/23-11/29/24    PT Start Time 1040   came late   PT Stop Time 1125    PT Time Calculation (min) 45 min    Activity Tolerance Patient tolerated treatment well    Behavior During Therapy [facility] for tasks assessed/performed          Past Medical History:  Diagnosis Date   Cholestasis of pregnancy 05/29/2017   Hemorrhagic ovarian cyst 10/03/2011   On CT 09/21/11    Hyperglycemia 10/2015   Hypothyroid    Pelvic abscess 07/2011   Complication of appendicitis--ultimately underwent percutaneous drainag and also had ureteral stent placed temporarily due to obstruction from abscess in right pelvis   Past Surgical History:  Procedure Laterality Date   ABDOMINOPLASTY  01/2024   APPENDECTOMY  08/12/2011   s/p abscess w/drain -drain d/c 12/27   AUGMENTATION MAMMAPLASTY Bilateral    April 2021   CYSTOSCOPY  10/24/2011   Procedure: CYSTOSCOPY;  Surgeon: Thomasine Oiler, MD;  Location: [facility];  Service: Urology;  Laterality: Right;  cysto and stent placement c arm   REMOVAL OF BILATERAL TISSUE EXPANDERS WITH PLACEMENT OF BILATERAL BREAST IMPLANTS Bilateral    Nov. 2022   Patient Active Problem List   Diagnosis Date Noted   Mixed stress and urge urinary incontinence 03/05/2024   History of pelvic surgery 03/05/2024   Nocturia 03/05/2024   Abnormal uterine bleeding (AUB) 06/02/2023   Low iron  06/02/2023   Shingles 12/27/2022   Elevated TSH 12/27/2022   Adjustment reaction with anxiety and depression 11/10/2021   S/P breast augmentation 05/03/2021   Periodontal disease 05/03/2021   Pelvic pain 10/27/2020   Gastroesophageal reflux  disease with esophagitis and hemorrhage 10/27/2020   Allergic rhinitis 10/27/2020    PCP: Brien Belvie DRAFTS , MD  REFERRING PROVIDER: Guadlupe Lianne DASEN, MD   REFERRING DIAG:  [id number] (ICD-10-CM) - Mixed stress and urge urinary incontinence  R10.2 (ICD-10-CM) - Pelvic pain  R35.1 (ICD-10-CM) - Nocturia    THERAPY DIAG:  Scar  Other low back pain  Pelvic pain  Cramp and spasm  Rationale for Evaluation and Treatment: Rehabilitation  ONSET DATE: 2012  SUBJECTIVE:  SUBJECTIVE STATEMENT:(interpreter present) Patient has not been to therapy due to losing her father 3 months ago. Patient is leaking urine and still has pain.    PAIN:  Are you having pain? Yes NPRS scale: 5/10 07/09/24: pain level 5/10 07/18/24; Pain level 4-5/10 10/03/24: 5/10 pain level and intermittent Pain location: pelvic area  Pain type: cramping and twisting Pain description: constant   Aggravating factors: penile penetration; comes on randomly Relieving factors: tylenol   PRECAUTIONS: None  RED FLAGS: None   WEIGHT BEARING RESTRICTIONS: No  FALLS:  Has patient fallen in last 6 months? No  OCCUPATION: construction  ACTIVITY LEVEL : moderate activity due to job  PLOF: Independent  PATIENT GOALS: reduce urinary leakage  PERTINENT HISTORY:  Abdominoplasty; Appendectomy; Pelvic abscess Sexual abuse: No  BOWEL MOVEMENT:NO issues  URINATION: Pain with urination: Yes Fully empty bladder: No When urinating, patient feels to push on her belly or vagina to empty bladder  07/25/24: not pressing on abdomen to urinate Stream: Weak Urgency: Yes  Frequency: Day time voids 5.  Nocturia: 2-3 times per night to void.  Leakage: Coughing, Sneezing, Exercise, and without sensation, continuous leakage Pads: Yes: panty liner  3-4 per day  INTERCOURSE:  Ability to have vaginal penetration Yes  Pain with intercourse: Initial Penetration, During Penetration, Deep Penetration, and Pain Interrupts Intercourse DrynessYes , uses lubricant Climax: yes Marinoff Scale: 3/3  PREGNANCY: Vaginal deliveries 4  PROLAPSE: Pressure   OBJECTIVE:  Note: Objective measures were completed at Evaluation unless otherwise noted.  DIAGNOSTIC FINDINGS:  none   COGNITION: Overall cognitive status: Within functional limits for tasks assessed     SENSATION: Light touch: Appears intact   LUMBARAROM/PROM: lumbar ROM is full   LOWER EXTREMITY ROM:  Passive ROM Right eval Left eval  Hip external rotation 40 40   (Blank rows = not tested)  LOWER EXTREMITY FFU:Apojuzmjo hip strength 4/5.   PALPATION:   General: Tenderness located in the lumbar, decreased movement os L3-L5  Pelvic Alignment: ASIS are equal  Abdominal: scar on abdomen are restricted; tightness in the abdomen from Abdominoplasty                 External Perineal Exam: tenderness located on the ischiocavernosus, bulbocavernosus, perineal body, levator ani                             Internal Pelvic Floor: tenderness located in levator ani and obturator internist, tightness along the posterior vaginal canal  Patient confirms identification and approves PT to assess internal pelvic floor and treatment Yes No emotional/communication barriers or cognitive limitation. Patient is motivated to learn. Patient understands and agrees with treatment goals and plan. PT explains patient will be examined in standing, sitting, and lying down to see how their muscles and joints work. When they are ready, they will be asked to remove their underwear so PT can examine their perineum. The patient is also given the option of providing their own chaperone as one is not provided in our facility. The patient also has the right and is explained the right to defer or refuse any  part of the evaluation or treatment including the internal exam. With the patient's consent, PT will use one gloved finger to gently assess the muscles of the pelvic floor, seeing how well it contracts and relaxes and if there is muscle symmetry. After, the patient will get dressed and PT and patient will discuss exam findings and plan  of care. PT and patient discuss plan of care, schedule, attendance policy and HEP activities.   PELVIC MMT:   MMT eval 07/18/24  Vaginal 2/5 3/5 after manual work  (Blank rows = not tested)        TONE: Average tone fo pelvic floor  PROLAPSE: Posteriro wall weakness at entrance, anterior wall weakness at entrance, slight uterine downward movement  TODAY'S TREATMENT:   10/03/24 Manual: Scar tissue mobilization: Manual work to the lower abdominal scar and scar around the umbilicus to reduce the restrictions and reduce pain.  Neuromuscular re-education: Down training: Diaphragmatic breathing with sitting on ball to feel the pelvic floor relax Educated patient on the urge to void and she was able to return demonstration Self-care: Discussed with patient on her goals and updated her progress Discussed with patient using lubricants and vaginal moisturizers. Educated on how they work and where to place them.     07/25/24 Manual: Soft tissue mobilization: Manual work to the diaphragm to improve mobility for diaphragmatic breathing Manual work along the trigger points in the rectus abdominus Scar tissue mobilization: Manual work to the scar going through the restrictions in all different directions  Using a suction cup on the scar to improve the mobility and reduce the restrictions Myofascial release: Tissue rolling along the lower rib cage Fascial release along the suprapubic area and sides of lower abdomen Neuromuscular re-education: Down training: Diaphragmatic breathing with opening the lower rib cage and feeling it in the pelvic floor Karolynn pose  with diaphragmatic breathing Thread the needle with diaphragmatic breathing Pulling the leg over the body for trunk rotation Prone press up to open up the abdomen and increase lumbar extension  07/18/24 Manual: Soft tissue mobilization: Manual work to the left hip adductor to elongate the muscle Manual work to the ischiocavernosus to reduce trigger points Internal pelvic floor techniques: No emotional/communication barriers or cognitive limitation. Patient is motivated to learn. Patient understands and agrees with treatment goals and plan. PT explains patient will be examined in standing, sitting, and lying down to see how their muscles and joints work. When they are ready, they will be asked to remove their underwear so PT can examine their perineum. The patient is also given the option of providing their own chaperone as one is not provided in our facility. The patient also has the right and is explained the right to defer or refuse any part of the evaluation or treatment including the internal exam. With the patient's consent, PT will use one gloved finger to gently assess the muscles of the pelvic floor, seeing how well it contracts and relaxes and if there is muscle symmetry. After, the patient will get dressed and PT and patient will discuss exam findings and plan of care. PT and patient discuss plan of care, schedule, attendance policy and HEP activities.  Therapist gloved finger in the vaginal canal working on the right urethra sphincter, levator ani, obturator internist, right side of bladder, mobilization of the cervix.       PATIENT EDUCATION:  07/09/24 Education details: Access Code: [email or web address], educated patient on how to mobilize her scars Person educated: Patient Education method: Explanation, Demonstration, Tactile cues, Verbal cues, and Handouts Education comprehension: verbalized understanding, returned demonstration, verbal cues required, tactile cues required, and needs further  education  HOME EXERCISE PROGRAM: 07/09/24 Access Code: [email or web address] URL: [email or web address] Date: 07/09/2024 Prepared by: Channing Pereyra  Exercises - Supine Diaphragmatic Breathing  - 1 x daily - 7 x weekly - 1 sets -  10 reps - Seated Diaphragmatic Breathing  - 1 x daily - 7 x weekly - 1 sets - 10 reps - Seated Piriformis Stretch with Trunk Bend  - 1 x daily - 7 x weekly - 1 sets - 2 reps - 30 sec hold - Cat Cow  - 1 x daily - 7 x weekly - 1 sets - 10 reps - Diaphragmatic Breathing in Child's Pose with Pelvic Floor Relaxation  - 1 x daily - 7 x weekly - 1 sets - 2 reps - 30 sec hold - Child's Pose with Thread the Needle  - 1 x daily - 7 x weekly - 1 sets - 2 reps - 15 sec hold - Prone Press Up  - 1 x daily - 7 x weekly - 1 sets - 10 reps  ASSESSMENT:  CLINICAL IMPRESSION: Patient is a 38  y.o. female who was seen today for physical therapy treatment for pelvic pain, mixed incontinence, and nocturia. Pelvic pain is 5/10 and is intermittent compared to constant. Patient is waking up 2 time at night instead of 3 times. Patient will leak urine with sneeze, cough, jumping, and laugh.  Patient continues to have urinary urgency.  Pain with penile penetration vaginally is 80% better. Patient has 4/10 pain level with urination. She understands how to perform the urge to void technique. She understands why to use vaginal moisturizers and how to apply them. Patient continues to have tightness in the lower abdominal area by her scars. Patient will benefit from skilled therapy to improve pelvic floor coordination and strength while reducing her pain.   OBJECTIVE IMPAIRMENTS: decreased activity tolerance, decreased coordination, decreased ROM, decreased strength, increased fascial restrictions, increased muscle spasms, and pain.   ACTIVITY LIMITATIONS: carrying, lifting, bending, sitting, standing, squatting, continence, toileting, and locomotion level  PARTICIPATION LIMITATIONS: meal prep,  cleaning, laundry, interpersonal relationship, shopping, community activity, and occupation  PERSONAL FACTORS: 1-2 comorbidities: Abdominoplasty; Appendectomy; Pelvic abscess are also affecting patient's functional outcome.   REHAB POTENTIAL: Good  CLINICAL DECISION MAKING: Evolving/moderate complexity  EVALUATION COMPLEXITY: Moderate   GOALS: Goals reviewed with patient? Yes  SHORT TERM GOALS: Target date: 08/01/24  Patient independent with initial HEP for scar massage to improve tissue mobility.  Baseline: Goal status: Met 07/18/24  2.  Patient is able to perform diaphragmatic breathing to lengthen the pelvic floor to reduce pain.  Baseline:  Goal status: Met 07/25/24  3.  Patient educated on urgency to reduce the urge to void.  Baseline:  Goal status: Met  10/03/24   LONG TERM GOALS: Target date: 01/02/24  Patient is independent with advanced HEP for core and pelvic floor strength.  Baseline:  Goal status: Ongoing 10/03/24  2.  Patient is able  demonstrate pressure management with lifting, carrying, and other work tasks to reduce pressure on the pelvic floor.  Baseline:  Goal status: ongoing 10/03/24  3.  Patient is able to have the urge to urinate and use her urge suppression skills and walk to the bathroom without leaking.  Baseline:  Goal status: Ongoing 10/03/24  4.  Patient is able to use her diaphragmatic breathing to relax her pelvic floor and push the urine out without pushing on her abdomen.  Baseline:  Goal status: Ongoing 10/03/24  5.  Patient reports her pain level is </= 2/10 with her daily tasks due to improved abdominal tissue mobility, increased pelvic floor strength and reduction of trigger points. Baseline:  Goal status: Ongoing 10/03/24   PLAN:  PT FREQUENCY:  1-2x/week  PT DURATION: other: 5 months  PLANNED INTERVENTIONS: 97110-Therapeutic exercises, 97530- Therapeutic activity, 97112- Neuromuscular re-education, [phone]- Self Care, 02859-  Manual therapy, G0283- Electrical stimulation (unattended), 20560 (1-2 muscles), 20561 (3+ muscles)- Dry Needling, Patient/Family education, Joint mobilization, Spinal mobilization, Scar mobilization, Cryotherapy, Moist heat, and Biofeedback  PLAN FOR NEXT SESSION:  education of prolapse,  core strength; manual work around the scars   Channing Pereyra, PT 10/03/2024 11:30 AM

## 2024-10-03 NOTE — Patient Instructions (Signed)
 Moisturizers They are used in the vagina to hydrate the mucous membrane that make up the vaginal canal. Designed to keep a more normal acid balance (ph) Once placed in the vagina, it will last between two to three days.  Use 2-3 times per week at bedtime  Ingredients to avoid is glycerin  and fragrance, can increase chance of infection Should not be used just before sex due to causing irritation Most are gels administered either in a tampon-shaped applicator or as a vaginal suppository. They are non-hormonal.   Types of Moisturizers(internal use)  Vitamin E vaginal suppositories- Whole foods, Amazon Moist Again Coconut oil- can break down condoms, any grocery store (prefer organic) Julva- (Do no use if taking  Tamoxifen) amazon Yes moisturizer- amazon NeuEve Silk , NeuEve Silver for menopausal or over 65 (if have severe vaginal atrophy or cancer treatments use NeuEve Silk for  1 month than move to [employer])- [employer], Armona.com Olive and Bee intimate cream- [email or web address].com.au Mae vaginal moisturizer- Amazon Aloe Good Clean Love Hyaluronic acid Hyalofemme Reveree hyaluronic acid inserts   Creams to use externally on the Vulva area Marathon Oil (good for for cancer patients that had radiation to the area)- amazon or newell rubbermaid.[email or web address]://[email or web address]/ Vulva Balm/ V-magic cream by medicine mama- amazon Julva-amazon Vital V Wild Yam salve ( help moisturize and help with thinning vulvar area, does have Beeswax MoodMaid Botanical Pro-Meno Wild Yam Cream- Amazon Desert Harvest Gele Cleo by Sherrlyn labial moisturizer (Amazon),  Coconut or olive oil aloe Good Clean Love Enchanted Rose by intimate rose  Things to avoid in the vaginal area Do not use things to irritate the vulvar area No lotions just specialized creams for the vulva area- Neogyn, V-magic,  No soaps; can use Aveeno or Calendula cleanser, unscented Dove if needed. Must be gentle No deodorants No douches Good to  sleep without underwear to let the vaginal area to air out No scrubbing: spread the lips to let warm water  rinse over labias and pat dry  Urge Incontinence  Ideal urination frequency is every 2-4 wakeful hours, which equates to 5-8 times within a 24-hour period.   Urge incontinence is leakage that occurs when the bladder muscle contracts, creating a sudden need to go before getting to the bathroom.   Going too often when your bladder isn't actually full can disrupt the body's automatic signals to store and hold urine longer, which will increase urgency/frequency.  In this case, the bladder is running the show and strategies can be learned to retrain this pattern.   One should be able to control the first urge to urinate, at around .  The bladder can hold up to a grande latte, or . To help you gain control, practice the Urge Drill below when urgency strikes.  This drill will help retrain your bladder signals and allow you to store and hold urine longer.  The overall goal is to stretch out your time between voids to reach a more manageable voiding schedule.    Practice your quick flicks often throughout the day (each waking hour) even when you don't need feel the urge to go.  This will help strengthen your pelvic floor muscles, making them more effective in controlling leakage.  Urge Drill  When you feel an urge to go, follow these steps to regain control: Stop what you are doing and be still Take one deep breath, directing your air into your abdomen Think an affirming thought, such as I've got this. Do 5 quick flicks of your  pelvic floor Raise heels and let hit the ground hard 5 times Walk with control to the bathroom to void, or delay voiding If feel the urge again then repeat as above.   Channing Pereyra, PT [facility] [address] [place], [place] [address] W: [phone] [email or web address]

## 2024-11-07 ENCOUNTER — Encounter: Payer: Self-pay | Admitting: Physical Therapy

## 2024-11-12 ENCOUNTER — Encounter: Payer: Self-pay | Admitting: Physical Therapy
